# Patient Record
Sex: Male | Born: 1975 | Race: Black or African American | Hispanic: No | Marital: Married | Smoking: Never smoker
Health system: Southern US, Community
[De-identification: ages and names within clinical notes are randomized; demographics above are authoritative.]

## PROBLEM LIST (undated history)

## (undated) DIAGNOSIS — M3214 Glomerular disease in systemic lupus erythematosus: Secondary | ICD-10-CM

## (undated) DIAGNOSIS — M1990 Unspecified osteoarthritis, unspecified site: Secondary | ICD-10-CM

## (undated) DIAGNOSIS — N059 Unspecified nephritic syndrome with unspecified morphologic changes: Secondary | ICD-10-CM

## (undated) DIAGNOSIS — D61818 Other pancytopenia: Secondary | ICD-10-CM

## (undated) DIAGNOSIS — E785 Hyperlipidemia, unspecified: Secondary | ICD-10-CM

## (undated) DIAGNOSIS — I639 Cerebral infarction, unspecified: Secondary | ICD-10-CM

## (undated) DIAGNOSIS — I10 Essential (primary) hypertension: Secondary | ICD-10-CM

## (undated) DIAGNOSIS — D649 Anemia, unspecified: Secondary | ICD-10-CM

## (undated) HISTORY — PX: WISDOM TOOTH EXTRACTION: SHX21

## (undated) HISTORY — PX: COLONOSCOPY: SHX174

## (undated) HISTORY — DX: Essential (primary) hypertension: I10

## (undated) HISTORY — DX: Unspecified osteoarthritis, unspecified site: M19.90

## (undated) HISTORY — DX: Cerebral infarction, unspecified: I63.9

## (undated) HISTORY — DX: Anemia, unspecified: D64.9

## (undated) HISTORY — PX: TONSILLECTOMY: SUR1361

## (undated) HISTORY — DX: Hyperlipidemia, unspecified: E78.5

## (undated) HISTORY — DX: Other pancytopenia: D61.818

## (undated) HISTORY — DX: Glomerular disease in systemic lupus erythematosus: M32.14

---

## 2001-12-06 HISTORY — PX: RENAL BIOPSY: SHX156

## 2002-05-17 ENCOUNTER — Encounter: Payer: Self-pay | Admitting: *Deleted

## 2002-05-17 ENCOUNTER — Observation Stay (HOSPITAL_COMMUNITY): Admission: EM | Admit: 2002-05-17 | Discharge: 2002-05-18 | Payer: Self-pay | Admitting: *Deleted

## 2002-05-18 ENCOUNTER — Encounter: Payer: Self-pay | Admitting: Family Medicine

## 2002-05-20 ENCOUNTER — Emergency Department (HOSPITAL_COMMUNITY): Admission: EM | Admit: 2002-05-20 | Discharge: 2002-05-20 | Payer: Self-pay | Admitting: Emergency Medicine

## 2003-03-04 ENCOUNTER — Emergency Department (HOSPITAL_COMMUNITY): Admission: AD | Admit: 2003-03-04 | Discharge: 2003-03-04 | Payer: Self-pay | Admitting: Emergency Medicine

## 2003-10-28 ENCOUNTER — Emergency Department (HOSPITAL_COMMUNITY): Admission: EM | Admit: 2003-10-28 | Discharge: 2003-10-28 | Payer: Self-pay | Admitting: Emergency Medicine

## 2004-01-12 ENCOUNTER — Emergency Department (HOSPITAL_COMMUNITY): Admission: EM | Admit: 2004-01-12 | Discharge: 2004-01-12 | Payer: Self-pay | Admitting: Emergency Medicine

## 2009-02-18 ENCOUNTER — Ambulatory Visit: Payer: Self-pay | Admitting: Family Medicine

## 2009-02-18 ENCOUNTER — Encounter: Payer: Self-pay | Admitting: Family Medicine

## 2009-02-18 DIAGNOSIS — I10 Essential (primary) hypertension: Secondary | ICD-10-CM

## 2009-02-18 DIAGNOSIS — M25569 Pain in unspecified knee: Secondary | ICD-10-CM | POA: Insufficient documentation

## 2009-02-18 DIAGNOSIS — M25559 Pain in unspecified hip: Secondary | ICD-10-CM | POA: Insufficient documentation

## 2009-02-18 DIAGNOSIS — E785 Hyperlipidemia, unspecified: Secondary | ICD-10-CM | POA: Insufficient documentation

## 2009-02-18 HISTORY — DX: Hyperlipidemia, unspecified: E78.5

## 2009-02-18 HISTORY — DX: Pain in unspecified hip: M25.559

## 2009-02-18 HISTORY — DX: Essential (primary) hypertension: I10

## 2009-03-08 ENCOUNTER — Emergency Department (HOSPITAL_COMMUNITY): Admission: EM | Admit: 2009-03-08 | Discharge: 2009-03-09 | Payer: Self-pay | Admitting: Emergency Medicine

## 2009-07-08 ENCOUNTER — Ambulatory Visit: Payer: Self-pay | Admitting: Family Medicine

## 2009-07-08 DIAGNOSIS — M545 Low back pain, unspecified: Secondary | ICD-10-CM | POA: Insufficient documentation

## 2009-10-23 ENCOUNTER — Encounter (INDEPENDENT_AMBULATORY_CARE_PROVIDER_SITE_OTHER): Payer: Self-pay | Admitting: *Deleted

## 2011-02-15 ENCOUNTER — Ambulatory Visit (INDEPENDENT_AMBULATORY_CARE_PROVIDER_SITE_OTHER): Payer: Federal, State, Local not specified - PPO | Admitting: Family Medicine

## 2011-02-15 ENCOUNTER — Encounter: Payer: Self-pay | Admitting: Family Medicine

## 2011-02-15 VITALS — BP 120/90 | Temp 98.5°F | Ht 77.0 in | Wt 253.0 lb

## 2011-02-15 DIAGNOSIS — M549 Dorsalgia, unspecified: Secondary | ICD-10-CM

## 2011-02-15 DIAGNOSIS — M546 Pain in thoracic spine: Secondary | ICD-10-CM

## 2011-02-15 MED ORDER — DICLOFENAC SODIUM 75 MG PO TBEC: 75.0000 mg | DELAYED_RELEASE_TABLET | Freq: Two times a day (BID) | ORAL | Status: AC

## 2011-02-15 MED ORDER — METHOCARBAMOL 750 MG PO TABS: 750.0000 mg | ORAL_TABLET | Freq: Four times a day (QID) | ORAL | Status: AC

## 2011-02-15 NOTE — Patient Instructions (Signed)
Use heat or ice for symptomatic relief.   Be in touch if pain no better in one week.

## 2011-02-15 NOTE — Progress Notes (Signed)
  Subjective:    Patient ID: Austin [name], male    DOB: 1976/09/18, 35 y.o.   MRN: [id number]  HPI  patient seen with pain cervical neck upper back and radiating to both shoulders. Onset last Thursday. 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 He was in a different car which he thinks might have been a factor. He describes throbbing and sharp pain which is not well localized. Question of some left hand weakness. No known injury. Does lifting up to 70-80 pounds with job. Tried left over muscle relaxer without much improvement. Also ibuprofen without relief. Muscle massage without relief. No history of disc herniation.   Review of Systems  Constitutional: Negative for fever, chills, activity change, appetite change and fatigue.  HENT: Positive for neck pain and neck stiffness.   Respiratory: Negative for cough and shortness of breath.   Cardiovascular: Negative for chest pain.  Musculoskeletal: Positive for back pain. Negative for joint swelling and arthralgias.  Neurological: Negative for headaches.       Objective:   Physical Exam    patient is alert and in no distress Neck exam reveals no spinal tenderness.  Cervical muscle tenderness. Pain with flexion, extension, and lateral bending to the right and left side. No adenopathy noted Chest clear to auscultation Heart regular rhythm and rate   upper extremities reveal no muscle atrophy.  Neuro exam strength is full throughout. Deep tender reflexes 2+ throughout upper extremity. Normal symmetric function to touch    Assessment & Plan:   upper back and neck pain. Suspect musculoskeletal. Robaxin 750 mg every 6 hours as needed and diclofenac 75 mg twice a day. Work note written through the 15th of this month. Consider physical therapy if not improving by end of week.

## 2011-04-23 NOTE — Discharge Summary (Signed)
[facility]  Patient:    Austin [name], Austin [name] Visit Number: [id number] MRN: [id number]          Service Type: EMS Location: ED Attending Physician:  Donnetta Hutching Dictated by:   Donna Bernard, M.D. Admit Date:  05/20/2002 Discharge Date: 05/20/2002                             Discharge Summary  DISCHARGE DIAGNOSIS:  Nephrotic syndrome.  DISPOSITION:  Discharged to home.  DISCHARGE MEDICATION:  Altace 5 mg daily.  SPECIAL INSTRUCTIONS:  Avoid antiinflammatory medicine.  FOLLOWUP:  Follow up in the office in one to two weeks with Dr. Kristian Covey.  HISTORY OF PRESENT ILLNESS:  Please see H&P as dictated.  HOSPITAL COURSE:  This patient is a 35 year old, African-American male with benign prior medical history presenting to the hospital on the day of admission with achiness in arms and legs.  He had significant protein in his urine.  He had significant puffiness in his eyes and face.  His blood pressure was elevated.  Lab work revealed creatinine at 1.3 and sedimentation rate up at 42.  Albumin was less than 2.  The patient had a significant amount of protein in the urine.  Total protein in the blood was less than five.  This was felt to be a rather acute onset of nephrotic syndrome.  The patient was admitted to the hospital and further blood work was obtained.  The patient was started on Altace 5 mg daily to control his blood pressure.  Dr. Kristian Covey was consulted. He recommended further blood tests which were ordered.  A 24-hour creatinine clearance was performed.  The patient had a lipid profile which showed borderline high LDL at 140.  Hepatitis studies came back negative.  ANA was pending at the time of discharge.  After the 24-hour studies were done, the ANA returned significantly positive at 1280 and RPR was negative.  The patient continued to have significant proteinuria.  The day following admission, the patient was noted to be fairly  stable. Dr. Kristian Covey felt that we could continue workup on an outpatient basis.  He also had a chest x-ray which shows slight bronchitic changes.  He also had an upper abdominal ultrasound which showed pleural effusions, but no other difficulties.  The patient was discharged home on the day of discharge with diagnosis and disposition as noted above. Dictated by:   Donna Bernard, M.D. Attending Physician:  Donnetta Hutching DD:  06/05/02 TD:  06/07/02 Job: [id number]

## 2011-04-23 NOTE — Consult Note (Signed)
[facility]  Patient:    Austin [name], Austin [name] Visit Number: [id number] MRN: [id number]          Service Type: OBV Location: 3A A311 01 Attending Physician:  Harlow Asa Dictated by:   Maudry Diego, M.D. Proc. Date: 05/17/02 Admit Date:  05/17/2002 Discharge Date: 05/18/2002                            Consultation Report  REASON FOR CONSULTATION:  Proteinuria and leg edema.  HISTORY OF PRESENT ILLNESS:  The patient is a 35 year old African-American male with a significant past medical history.  Presently, came with complaints of swelling of his eyelids, his feet.  During his workup, he was found to have a 300 mg/dL of proteinuria; hence, consult was called.  According to the patient initially his problem started with joint pain in his shoulders and back.  This was followed by some sort of mild sore throat.  In the last three to four days, the patient started to have swelling of his eyelids and also his legs.  The patient denies any fevers, chills, or sweating.  He does not have any cough nor sputum production.  He denies any urgency, frequency.  PAST MEDICAL HISTORY:  As stated above.  The patient does not have any significant past medical history.  MEDICATIONS:  He has never been treated with any medication.  ALLERGIES:  None.  SOCIAL HISTORY:  No history of smoking or alcohol abuse.  The patient married with two kids.  REVIEW OF SYSTEMS:  No fevers, chills, or sweating.  No nausea or vomiting but he has some bloating of his abdomen.  He has swelling of his eyelids the last two to three weeks.  He has also some occasional shortness of breath.  No diarrhea.  No urgency or frequency.  No change in the amount of his urine. The patient states that his urine is somewhat darker.  PHYSICAL EXAMINATION:  GENERAL:  The patient is alert and in no apparent distress.  VITAL SIGNS:  He is afebrile.  Blood pressure is 147/71.  Heart rate is  81.  HEENT:  He has a little bit of puffiness of his eyelids.  There was no conjunctivae or pallor.  Nonicteric.  Oral mucosa seems to be moist and pink.  NECK:  Supple.  No JVD.  CHEST:  Decreased breath sounds.  Otherwise seems to be clear.  HEART:  Regular rate and rhythm.  No murmur.  ABDOMEN:  Soft.  Positive bowel sounds.  EXTREMITIES:  He has trace to 1+ edema.  LABORATORY DATA:  Chest x-ray has ______ changes ______, minimal interstitial prominence.  His blood work shows his white blood cell count is 3.1, hemoglobin 11.4, hematocrit 33.7, platelet of 203,000.  Potassium is 4, sodium is 139, BUN 18, creatinine 1.7, calcium 7.3.  Protein is 5, albumin is 2.  ACT is 196.  AST, ALT, and alkaline phosphatase is normal.  His urine is clear, specific gravity is 1.02, pH of 5, glucose negative.  Hemoglobin moderate, protein more than 300.  He has white blood cells 11 with 2 bacteria.  No red blood cells.  ASSESSMENT:  Proteinuria.  DISPOSITION: 1. The etiology at this moment is not clear, but in a patient who has a    history of sore throat, joint pain with some swelling and hypertension,    glomerulonephritis seems to be high on the list.  In this type of  presentation, usually the patient will have red blood cells in the urine    which the patient has and protein in the urine which the patient has.  The    etiology for this could be anywhere from a disease such as streptococcal    oral infection, acute hepatitis, infection, and lupus.  Other etiologies    which can cause proteinuria with edema should also be entertained with this    patient.  The differential diagnosis includes in a young adult possibly    with hypertension, hypercalcemia, nephrosclerosis seems to be high in the    list, but membranous can not be ruled out.  However, secondary causes such    as lupus nephritis and diseases membranoproliferative disease also should    be put in the differential  diagnosis. 2. Hypertension:  Possibly related to his renal disease secondary to salt and    fluid retention.  RECOMMENDATIONS:  Will check a ASO titer.  We will do a hepatitis B surface-antigen, hepatitis antibody, and hepatitis C.  We will do a 24-hour urine for creatinine clearance and we will check her compliments.  We will do an ultrasound of the kidney to evaluate kidney size.  Possibly, the patient may benefit from a kidney biopsy, especially if the ASO titer is negative. Dictated by:   Maudry Diego, M.D. Attending Physician:  Harlow Asa DD:  05/17/02 TD:  05/19/02 Job: [id number]

## 2011-04-23 NOTE — H&P (Signed)
[facility]  Patient:    Austin, [name] Visit Number: [id number] MRN: [id number]          Service Type: OBV Location: 3A A311 01 Attending Physician:  Harlow Asa Dictated by:   Donna Bernard, M.D. Admit Date:  05/17/2002 Discharge Date: 05/18/2002                           History and Physical  CHIEF COMPLAINT:  Swelling in legs, feeling poorly.  SUBJECTIVE:  This patient is a 35 year old black male with a benign prior medical history.  He presented to the emergency room the day of admission feeling "terrible."  He noted diffuse achiness in his arms and legs and significant amount of weakness.  He also notes significant swelling in both legs and feet.  He notes over the past several weeks he first started noticing some swelling developing in his ankles and feet accompanied by a sense of fullness in his abdomen.  He also noted puffiness around his eyes.  Patient also had significant pain in his left shoulder and left knee off and on.  He recalls no swelling.  He has had no warmth, no fever, no cough, no chest pain per se, no change in bowel habits.  As far as his urinating, he has noted that his urine has been dark and perhaps slight dysuria at times.  He has also noted a bit of polyuria.  He has had slight nausea and noted intermittent mild epigastric discomfort at times.  PAST SURGICAL HISTORY:  Remote tonsillectomy.  FAMILY HISTORY:  Essentially negative.  ALLERGIES:  None known.  CURRENT MEDICATIONS:  None.  SOCIAL HISTORY:  Patient is married.  No tobacco or alcohol use.  No drug use.  REVIEW OF SYSTEMS:  Otherwise negative.  PHYSICAL EXAMINATION  VITAL SIGNS:  Blood pressure 152/92, afebrile.  GENERAL:  Alert.  In no apparent distress.  HEENT:  Tympanic membranes normal.  Pharynx normal.  Fundi:  Disks sharp.  NECK:  Supple.  No lymphadenopathy.  LUNGS:  Clear.  No rubs.  HEART:  Regular rate and rhythm.  No significant  murmurs.  ABDOMEN:  Mild epigastric tenderness.  No rebound, no guarding, no masses, no CVA tenderness.  EXTREMITIES:  Edema 1-2+ bilaterally.  Pulses intact.  SKIN:  No significant rash.  NEUROLOGIC:  Normal.  LABORATORIES:  CBC:  White blood count 3.1, hemoglobin 11.4, normal differential.  MET-7:  Potassium 4.0, BUN 27, creatinine 1.3.  Liver enzymes good.  Sedimentation rate up at 42.  CPK negative.  Urinalysis:  1.025 specific gravity, 7-10 red blood cells, 11-20 white blood cells, protein greater than 300, albumin level serum less than 2, total protein less than 5.  IMPRESSION:  Nephrotic syndrome, etiology unclear.  To expedite work-up, I have brought the patient in under observational status to get started with a stat work-up and evaluation.  PLAN:  Consultation with Dr. Kristian Covey.  He will see the patient tonight.  I have added ultrasound of the kidneys and upper abdomen and additional blood work.  Please see chart for details. Dictated by:   Donna Bernard, M.D. Attending Physician:  Harlow Asa DD:  05/17/02 TD:  05/20/02 Job: [id number]

## 2012-03-11 ENCOUNTER — Emergency Department (HOSPITAL_COMMUNITY)
Admission: EM | Admit: 2012-03-11 | Discharge: 2012-03-11 | Disposition: A | Discharge status: Home / Self Care | Payer: Federal, State, Local not specified - PPO | Source: Home / Self Care | Attending: Emergency Medicine | Admitting: Emergency Medicine

## 2012-03-11 ENCOUNTER — Encounter (HOSPITAL_COMMUNITY): Payer: Self-pay | Admitting: Emergency Medicine

## 2012-03-11 DIAGNOSIS — S0501XA Injury of conjunctiva and corneal abrasion without foreign body, right eye, initial encounter: Secondary | ICD-10-CM

## 2012-03-11 DIAGNOSIS — S058X9A Other injuries of unspecified eye and orbit, initial encounter: Secondary | ICD-10-CM

## 2012-03-11 DIAGNOSIS — H00019 Hordeolum externum unspecified eye, unspecified eyelid: Secondary | ICD-10-CM

## 2012-03-11 MED ORDER — POLYETHYL GLYCOL-PROPYL GLYCOL 0.4-0.3 % OP SOLN: 1.0000 [drp] | Freq: Four times a day (QID) | OPHTHALMIC | Status: DC | PRN

## 2012-03-11 MED ORDER — TETRACAINE HCL 0.5 % OP SOLN
1.0000 [drp] | Freq: Once | OPHTHALMIC | Status: AC
  Administered 2012-03-11: 1 [drp] via OPHTHALMIC

## 2012-03-11 MED ORDER — CIPROFLOXACIN HCL 0.3 % OP SOLN: OPHTHALMIC | Status: AC

## 2012-03-11 MED ORDER — TETRACAINE HCL 0.5 % OP SOLN
OPHTHALMIC | Status: AC
  Filled 2012-03-11: qty 2

## 2012-03-11 NOTE — Discharge Instructions (Signed)
You have a scratch of the eye on the cornea (the clear part of the eye). This condition may be caused by trauma. It is a common problem for people who wear contact lenses. Proper treatment is important. No evidence of infection is noted today, but you could develop an infection called a corneal ulcer or have some retained foreign body that may or may not have been noted today in the ED. Ulcers are not only painful, but they may also scar the cornea and cause permanent damage to the eye.  Do not rub your eyes. You may use Systane or artifical tears as much as you want to for comfort. Wear sunglasses if lights are hurting your eyes.If you wear contact lenses, do not use them until your eye caregiver approves.  Follow-up care is necessary to be sure the corneal abrasion is healing if not completely resolved in 2-3 days. See your caregiver or eye specialist as suggested for followup  Go to www.goodrx.com to look up your medications. This will give you a list of where you can find your prescriptions at the most affordable prices.   

## 2012-03-11 NOTE — ED Notes (Signed)
Right eye pain onset Wednesday.  Initiated in corner of eye and then spreads.  Reports pain is significant.  No other illness and no fever.  Patient reports history of the same.  Patient has seen specialist.  Reports having eye lashes trimmed.  Has an empty bottle labeled azithromycin  ophth solution prescribed by dr Nelle Don.

## 2012-03-11 NOTE — ED Provider Notes (Signed)
History     CSN: [id number]  Arrival date & time 03/11/12  0909   First MD Initiated Contact with Patient 03/11/12 0913      Chief Complaint  Patient presents with  . Eye Pain    (Consider location/radiation/quality/duration/timing/severity/associated sxs/prior treatment) HPI Comments: Patient with a history of recurrent sties comes in with one week of right upper eyelid pain, swelling, foreign body sensation. States he started doing warm compresses 4 times a day yesterday, with some relief. States he has been rubbing his eye. No nausea, vomiting, photophobia, visual changes, discharge from eyes. No facial rash, conjunctival injection. Patient does not wear contacts. Has been treated in the past with azithromycin eyedrops with relief. States this feels identical to previous episodes of styes. Patient's ophthalmologist is Dr. Nelle Don.  ROS as noted in HPI. All other ROS negative.   Patient is a 36 y.o. male presenting with eye pain. The history is provided by the patient. No language interpreter was used.  Eye Pain This is a recurrent problem. The current episode started more than 2 days ago. The problem occurs constantly. The problem has been gradually worsening. The symptoms are aggravated by nothing. The symptoms are relieved by nothing. Treatments tried: warm compresses. The treatment provided mild relief.    Past Medical History  Diagnosis Date  . HYPERLIPIDEMIA 02/18/2009  . HYPERTENSION 02/18/2009    Past Surgical History  Procedure Date  . Renal biopsy 2003  . Tonsillectomy     Family History  Problem Relation Age of Onset  . Hypertension Maternal Grandmother   . Diabetes Maternal Grandmother   . Hypertension Maternal Grandfather   . Cancer Maternal Grandfather     prostate    History  Substance Use Topics  . Smoking status: Never Smoker   . Smokeless tobacco: Not on file  . Alcohol Use: Yes      Review of Systems  Eyes: Positive for pain.    Allergies   Review of patient's allergies indicates no known allergies.  Home Medications   Current Outpatient Rx  Name Route Sig Dispense Refill  . CIPROFLOXACIN HCL 0.3 % OP SOLN  1-2 drops in affected eye 4 times/day x 5 days 5 mL 0  . POLYETHYL GLYCOL-PROPYL GLYCOL 0.4-0.3 % OP SOLN Ophthalmic Apply 1 drop to eye 4 (four) times daily as needed. 5 mL 0  . TRAMADOL HCL 50 MG PO TABS Oral Take 50 mg by mouth every 6 (six) hours as needed.        BP 130/89  Pulse 74  Temp(Src) 98 F (36.7 C) (Oral)  Resp 17  SpO2 98%  Physical Exam  Nursing note and vitals reviewed. Constitutional: He is oriented to person, place, and time. He appears well-developed and well-nourished.  HENT:  Head: Normocephalic and atraumatic.  Eyes: Conjunctivae and EOM are normal. Pupils are equal, round, and reactive to light. No foreign bodies found. Right eye exhibits hordeolum. Right eye exhibits no chemosis, no discharge and no exudate. No scleral icterus.  Slit lamp exam:      The right eye shows corneal abrasion and fluorescein uptake. The right eye shows no corneal ulcer and no foreign body.         Visual acuity left eye 20/20, right eye 2025.  Neck: Normal range of motion.  Cardiovascular: Normal rate.   Pulmonary/Chest: Effort normal. No respiratory distress.  Abdominal: He exhibits no distension.  Musculoskeletal: Normal range of motion.  Neurological: He is alert and oriented to person,  place, and time.  Skin: Skin is warm and dry.  Psychiatric: He has a normal mood and affect. His behavior is normal.    ED Course  Procedures (including critical care time)  Labs Reviewed - No data to display No results found.   1. Corneal abrasion, right   2. Hordeolum       MDM    Luiz Blare, MD 03/11/12 [phone]

## 2013-09-28 ENCOUNTER — Encounter (HOSPITAL_COMMUNITY): Payer: Self-pay | Admitting: Emergency Medicine

## 2013-09-28 ENCOUNTER — Emergency Department (HOSPITAL_COMMUNITY): Payer: Federal, State, Local not specified - PPO

## 2013-09-28 ENCOUNTER — Inpatient Hospital Stay (HOSPITAL_COMMUNITY)
Admission: EM | Admit: 2013-09-28 | Discharge: 2013-10-02 | DRG: 546 | Disposition: A | Discharge status: Home / Self Care | Payer: Federal, State, Local not specified - PPO | Attending: Internal Medicine | Admitting: Internal Medicine

## 2013-09-28 DIAGNOSIS — D61818 Other pancytopenia: Secondary | ICD-10-CM | POA: Diagnosis present

## 2013-09-28 DIAGNOSIS — M2550 Pain in unspecified joint: Secondary | ICD-10-CM | POA: Diagnosis present

## 2013-09-28 DIAGNOSIS — N059 Unspecified nephritic syndrome with unspecified morphologic changes: Secondary | ICD-10-CM

## 2013-09-28 DIAGNOSIS — R319 Hematuria, unspecified: Secondary | ICD-10-CM | POA: Diagnosis present

## 2013-09-28 DIAGNOSIS — M329 Systemic lupus erythematosus, unspecified: Principal | ICD-10-CM | POA: Diagnosis present

## 2013-09-28 DIAGNOSIS — N041 Nephrotic syndrome with focal and segmental glomerular lesions: Secondary | ICD-10-CM | POA: Diagnosis present

## 2013-09-28 DIAGNOSIS — I10 Essential (primary) hypertension: Secondary | ICD-10-CM | POA: Diagnosis present

## 2013-09-28 DIAGNOSIS — E871 Hypo-osmolality and hyponatremia: Secondary | ICD-10-CM | POA: Diagnosis present

## 2013-09-28 DIAGNOSIS — N032 Chronic nephritic syndrome with diffuse membranous glomerulonephritis: Secondary | ICD-10-CM | POA: Diagnosis present

## 2013-09-28 DIAGNOSIS — R600 Localized edema: Secondary | ICD-10-CM

## 2013-09-28 DIAGNOSIS — D696 Thrombocytopenia, unspecified: Secondary | ICD-10-CM

## 2013-09-28 DIAGNOSIS — N058 Unspecified nephritic syndrome with other morphologic changes: Secondary | ICD-10-CM | POA: Diagnosis present

## 2013-09-28 DIAGNOSIS — D649 Anemia, unspecified: Secondary | ICD-10-CM

## 2013-09-28 DIAGNOSIS — E785 Hyperlipidemia, unspecified: Secondary | ICD-10-CM | POA: Diagnosis present

## 2013-09-28 DIAGNOSIS — N179 Acute kidney failure, unspecified: Secondary | ICD-10-CM | POA: Diagnosis present

## 2013-09-28 DIAGNOSIS — D638 Anemia in other chronic diseases classified elsewhere: Secondary | ICD-10-CM | POA: Diagnosis present

## 2013-09-28 HISTORY — DX: Unspecified nephritic syndrome with unspecified morphologic changes: N05.9

## 2013-09-28 HISTORY — DX: Pain in unspecified joint: M25.50

## 2013-09-28 LAB — CBC WITH DIFFERENTIAL/PLATELET
Basophils Absolute: 0 10*3/uL (ref 0.0–0.1)
Basophils Relative: 0 % (ref 0–1)
Eosinophils Absolute: 0.1 10*3/uL (ref 0.0–0.7)
Eosinophils Relative: 2 % (ref 0–5)
HCT: 28.7 % — ABNORMAL LOW (ref 39.0–52.0)
Hemoglobin: 9.5 g/dL — ABNORMAL LOW (ref 13.0–17.0)
Lymphocytes Relative: 42 % (ref 12–46)
Lymphs Abs: 1.4 10*3/uL (ref 0.7–4.0)
MCH: 27.7 pg (ref 26.0–34.0)
MCHC: 33.1 g/dL (ref 30.0–36.0)
MCV: 83.7 fL (ref 78.0–100.0)
Monocytes Absolute: 0.3 10*3/uL (ref 0.1–1.0)
Monocytes Relative: 9 % (ref 3–12)
Neutro Abs: 1.6 10*3/uL — ABNORMAL LOW (ref 1.7–7.7)
Neutrophils Relative %: 47 % (ref 43–77)
Platelets: 131 10*3/uL — ABNORMAL LOW (ref 150–400)
RBC: 3.43 MIL/uL — ABNORMAL LOW (ref 4.22–5.81)
RDW: 13 % (ref 11.5–15.5)
WBC: 3.4 10*3/uL — ABNORMAL LOW (ref 4.0–10.5)

## 2013-09-28 LAB — URINALYSIS, ROUTINE W REFLEX MICROSCOPIC
Bilirubin Urine: NEGATIVE
Glucose, UA: NEGATIVE mg/dL
Ketones, ur: 15 mg/dL — AB
Leukocytes, UA: NEGATIVE
Nitrite: NEGATIVE
Protein, ur: >300 mg/dL — AB
Specific Gravity, Urine: 1.029 (ref 1.005–1.030)
Urobilinogen, UA: 1 mg/dL (ref 0.0–1.0)
pH: 5.5 (ref 5.0–8.0)

## 2013-09-28 LAB — COMPREHENSIVE METABOLIC PANEL
ALT: 13 U/L (ref 0–53)
AST: 20 U/L (ref 0–37)
Albumin: 2.2 g/dL — ABNORMAL LOW (ref 3.5–5.2)
Alkaline Phosphatase: 56 U/L (ref 39–117)
BUN: 21 mg/dL (ref 6–23)
CO2: 25 mEq/L (ref 19–32)
Calcium: 7.2 mg/dL — ABNORMAL LOW (ref 8.4–10.5)
Chloride: 106 mEq/L (ref 96–112)
Creatinine, Ser: 1.22 mg/dL (ref 0.50–1.35)
GFR calc Af Amer: 85 mL/min — ABNORMAL LOW (ref >90)
GFR calc non Af Amer: 73 mL/min — ABNORMAL LOW (ref >90)
Glucose, Bld: 88 mg/dL (ref 70–99)
Potassium: 3.9 mEq/L (ref 3.5–5.1)
Sodium: 135 mEq/L (ref 135–145)
Total Bilirubin: 0.6 mg/dL (ref 0.3–1.2)
Total Protein: 6.5 g/dL (ref 6.0–8.3)

## 2013-09-28 LAB — URINE MICROSCOPIC-ADD ON

## 2013-09-28 LAB — PRO B NATRIURETIC PEPTIDE: Pro B Natriuretic peptide (BNP): 361.9 pg/mL — ABNORMAL HIGH (ref 0–125)

## 2013-09-28 MED ORDER — SODIUM CHLORIDE 0.9 % IJ SOLN
3.0000 mL | Freq: Two times a day (BID) | INTRAMUSCULAR | Status: DC
  Administered 2013-09-29 – 2013-10-02 (×6): 3 mL via INTRAVENOUS

## 2013-09-28 MED ORDER — ACETAMINOPHEN 325 MG PO TABS
650.0000 mg | ORAL_TABLET | Freq: Four times a day (QID) | ORAL | Status: DC | PRN
  Administered 2013-09-29: 650 mg via ORAL
  Filled 2013-09-28: qty 2

## 2013-09-28 MED ORDER — ONDANSETRON HCL 4 MG/2ML IJ SOLN
4.0000 mg | Freq: Four times a day (QID) | INTRAMUSCULAR | Status: DC | PRN
  Administered 2013-09-29 – 2013-09-30 (×2): 4 mg via INTRAVENOUS
  Filled 2013-09-28 (×2): qty 2

## 2013-09-28 MED ORDER — ACETAMINOPHEN 650 MG RE SUPP: 650.0000 mg | Freq: Four times a day (QID) | RECTAL | Status: DC | PRN

## 2013-09-28 MED ORDER — SODIUM CHLORIDE 0.9 % IV SOLN: 250.0000 mL | INTRAVENOUS | Status: DC | PRN

## 2013-09-28 MED ORDER — ONDANSETRON HCL 4 MG PO TABS: 4.0000 mg | ORAL_TABLET | Freq: Four times a day (QID) | ORAL | Status: DC | PRN

## 2013-09-28 MED ORDER — SODIUM CHLORIDE 0.9 % IJ SOLN: 3.0000 mL | INTRAMUSCULAR | Status: DC | PRN

## 2013-09-28 NOTE — Progress Notes (Signed)
  Pt orientation to unit, room and routine. Information packet given to patient/family.  Admission INP armband ID verified with patient/family, and in place. SR up x 2, fall risk assessment complete with Patient and family verbalizing understanding of risks associated with falls. Pt verbalizes an understanding of how to use the call bell and to call for help before getting out of bed.    Will cont to monitor and assist as needed.  Gilman Schmidt, RN 09/28/2013 11:56 PM

## 2013-09-28 NOTE — H&P (Signed)
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  Has not had any problems since then.  Has had scattered follow up.  Over the last 2-3 weeks he has noted his urine is darker in color and has gained about 30 lbs , he is having joint pain and swelling in both hands, and feet.  No fevers.  No n/v/d.  No cough.  No sob.  Vss.  Not established with a nephrologist locally, and cannot get appt for 3 weeks.  Asked to admit for nephrology consultation in am.  Review of Systems:  Positive and negative as per HPI otherwise all other systems are negative  Past Medical History: Past Medical History  Diagnosis Date  . HYPERLIPIDEMIA 02/18/2009  . HYPERTENSION 02/18/2009  . Nephritis    Past Surgical History  Procedure Laterality Date  . Renal biopsy  2003  . Tonsillectomy      Medications: Prior to Admission medications   Not on File    Allergies:  No Known Allergies  Social History:  reports that he has never smoked. He does not have any smokeless tobacco history on file. He reports that he drinks alcohol. He reports that he does not use illicit drugs.  Family History: Family History  Problem Relation Age of Onset  . Hypertension Maternal Grandmother   . Diabetes Maternal Grandmother   . Hypertension Maternal Grandfather   . Cancer Maternal Grandfather     prostate    Physical Exam: Filed Vitals:   09/28/13 1945 09/28/13 2000 09/28/13 2015 09/28/13 2115  BP: 139/76 131/85 135/77 135/78  Pulse:   88   Temp:   98.9 F (37.2 C)   TempSrc:   Oral   Resp:      Height:      Weight:      SpO2:   99%    General appearance: alert, cooperative and no distress Head: Normocephalic, without obvious abnormality, atraumatic Eyes: negative Nose: Nares normal. Septum midline. Mucosa normal. No  drainage or sinus tenderness. Neck: no JVD and supple, symmetrical, trachea midline Lungs: clear to auscultation bilaterally Heart: regular rate and rhythm, S1, S2 normal, no murmur, click, rub or gallop Abdomen: soft, non-tender; bowel sounds normal; no masses,  no organomegaly Extremities: edema trace Pulses: 2+ and symmetric Skin: Skin color, texture, turgor normal. No rashes or lesions Neurologic: Grossly normal    Labs on Admission:   Recent Labs  09/28/13 1910  NA 135  K 3.9  CL 106  CO2 25  GLUCOSE 88  BUN 21  CREATININE 1.22  CALCIUM 7.2*    Recent Labs  09/28/13 1910  AST 20  ALT 13  ALKPHOS 56  BILITOT 0.6  PROT 6.5  ALBUMIN 2.2*    Recent Labs  09/28/13 1910  WBC 3.4*  NEUTROABS 1.6*  HGB 9.5*  HCT 28.7*  MCV 83.7  PLT 131*    Radiological Exams on Admission: Dg Chest 2 View  09/28/2013   CLINICAL DATA:  Lower extremity swelling.  EXAM: CHEST  2 VIEW  COMPARISON:  None.  FINDINGS: The heart size and mediastinal contours are within normal limits. Both lungs are clear. The visualized skeletal structures are unremarkable.  IMPRESSION: No active cardiopulmonary disease.   Electronically Signed   By: Marry Guan.D.  On: 09/28/2013 21:59    Assessment/Plan 37 yo male with nephrotic syndrome appears recurrent  Principal Problem:   Nephrotic syndrome, focal and segmental glomerular lesions- hold off on any steroids at this time.  neprology has been called and to see pt in am for further recommendations.  obs on med surg.  Active Problems:   HYPERTENSION   Joint pain   Focal segmental glomerulosclerosis, tip variant with nephrosis    Austin [name],Austin [name] A 09/28/2013, 10:50 PM

## 2013-09-28 NOTE — ED Provider Notes (Signed)
CSN: [id number]     Arrival date & time 09/28/13  1823 History   First MD Initiated Contact with Patient 09/28/13 1920     Chief Complaint  Patient presents with  . 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  Nothing improves.  No meds except was taking nsaids prn for joint pains as his finger joints have been swelling.  No new rashes.  Pt still urinating okay. No chf hx.  Mild sob.  No liver issues. No smoking, etoh or drugs. Possible bite to medial left ankle, unsure from what.    The history is provided by the patient and a relative.    Past Medical History  Diagnosis Date  . HYPERLIPIDEMIA 02/18/2009  . HYPERTENSION 02/18/2009  . Nephritis    Past Surgical History  Procedure Laterality Date  . Renal biopsy  2003  . Tonsillectomy     Family History  Problem Relation Age of Onset  . Hypertension Maternal Grandmother   . Diabetes Maternal Grandmother   . Hypertension Maternal Grandfather   . Cancer Maternal Grandfather     prostate   History  Substance Use Topics  . Smoking status: Never Smoker   . Smokeless tobacco: Not on file  . Alcohol Use: Yes     Comment: rare    Review of Systems  Constitutional: Positive for appetite change, fatigue and unexpected weight change. Negative for fever and chills.  HENT: Negative for congestion.   Eyes: Negative for visual disturbance.  Respiratory: Positive for shortness of breath. Negative for cough.   Cardiovascular: Positive for leg swelling. Negative for chest pain.  Gastrointestinal: Negative for vomiting and abdominal pain.  Genitourinary: Positive for difficulty urinating. Negative for dysuria and flank pain.  Musculoskeletal: Negative for back pain,  neck pain and neck stiffness.  Skin: Positive for wound. Negative for rash.  Neurological: Negative for light-headedness and headaches.    Allergies  Review of patient's allergies indicates no known allergies.  Home Medications   Current Outpatient Rx  Name  Route  Sig  Dispense  Refill  . Polyethyl Glycol-Propyl Glycol (SYSTANE) 0.4-0.3 % SOLN   Ophthalmic   Apply 1 drop to eye 4 (four) times daily as needed.   5 mL   0   . traMADol (ULTRAM) 50 MG tablet   Oral   Take 50 mg by mouth every 6 (six) hours as needed.            BP 102/76  Pulse 77  Temp(Src) 98.4 F (36.9 C) (Oral)  Resp 18  Ht 6\' 6"  (1.981 m)  Wt 246 lb 8 oz (111.812 kg)  BMI 28.49 kg/m2  SpO2 100% Physical Exam  Nursing note and vitals reviewed. Constitutional: He is oriented to person, place, and time. He appears well-developed and well-nourished. No distress.  HENT:  Head: Normocephalic and atraumatic.  Eyes: Conjunctivae are normal. Right eye exhibits no discharge. Left eye exhibits no discharge.  Neck: Normal range of motion. Neck supple. No tracheal deviation present.  Cardiovascular: Normal rate and regular rhythm.   Pulmonary/Chest: Effort normal and breath sounds normal.  Abdominal: Soft. He exhibits distension (mild). There is no tenderness. There is no guarding.  Musculoskeletal: He exhibits edema (mild bilateral LE).  Neurological: He is alert and oriented to person,  place, and time.  Skin: Skin is warm. No rash noted.  Psychiatric: He has a normal mood and affect.    ED Course  Procedures (including critical care time) Labs Review Labs Reviewed  CBC WITH DIFFERENTIAL - Abnormal; Notable for the following:    WBC 3.4 (*)    RBC 3.43 (*)    Hemoglobin 9.5 (*)    HCT 28.7 (*)    Platelets 131 (*)    Neutro Abs 1.6 (*)    All other components within normal limits  COMPREHENSIVE METABOLIC PANEL  URINALYSIS, ROUTINE W REFLEX MICROSCOPIC   Imaging Review Dg Chest 2  View  09/28/2013   CLINICAL DATA:  Lower extremity swelling.  EXAM: CHEST  2 VIEW  COMPARISON:  None.  FINDINGS: The heart size and mediastinal contours are within normal limits. Both lungs are clear. The visualized skeletal structures are unremarkable.  IMPRESSION: No active cardiopulmonary disease.   Electronically Signed   By: Roque Lias M.D.   On: 09/28/2013 21:59    EKG Interpretation   None       MDM   1. Glomerulonephritis   2. Anemia   3. Thrombocytopenia   4. Hematuria   5. Bilateral leg edema    Concern for kidney pathology along with possible systemic issue with HPI/ exam and hx.  Labs ordered. Well appearing in ED.   Clinically and labs confirm glomerulonephritis/ ?nephrotic syndrome. Spoke with Dr Arrie Aran, pt does not have close fup outpt so will need admission for diuresis, ? Biopsy and likely steroids. Spoke with Dr Onalee Hua hospitalist, agreed.   The patients results and plan were reviewed and discussed.   Any x-rays performed were personally reviewed by myself.   Differential diagnosis were considered with the presenting HPI.  Diagnosis: Glomerulonephritis. Leg edema bilateral, anemia, hematuria   Admission/ observation were discussed with the admitting physician, patient and/or family and they are comfortable with the plan.       Enid Skeens, MD 09/28/13 2219
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
## 2013-09-29 DIAGNOSIS — M2550 Pain in unspecified joint: Secondary | ICD-10-CM

## 2013-09-29 DIAGNOSIS — N032 Chronic nephritic syndrome with diffuse membranous glomerulonephritis: Secondary | ICD-10-CM

## 2013-09-29 DIAGNOSIS — D61818 Other pancytopenia: Secondary | ICD-10-CM

## 2013-09-29 DIAGNOSIS — D638 Anemia in other chronic diseases classified elsewhere: Secondary | ICD-10-CM | POA: Diagnosis present

## 2013-09-29 DIAGNOSIS — R609 Edema, unspecified: Secondary | ICD-10-CM

## 2013-09-29 HISTORY — DX: Other pancytopenia: D61.818

## 2013-09-29 LAB — CBC
MCHC: 33.7 g/dL (ref 30.0–36.0)
MCV: 83.1 fL (ref 78.0–100.0)
Platelets: 135 10*3/uL — ABNORMAL LOW (ref 150–400)
RDW: 12.9 % (ref 11.5–15.5)

## 2013-09-29 LAB — CREATININE, URINE, RANDOM: Creatinine, Urine: 207.98 mg/dL

## 2013-09-29 LAB — BASIC METABOLIC PANEL
BUN: 20 mg/dL (ref 6–23)
Calcium: 7.2 mg/dL — ABNORMAL LOW (ref 8.4–10.5)
Creatinine, Ser: 1.2 mg/dL (ref 0.50–1.35)
GFR calc Af Amer: 87 mL/min — ABNORMAL LOW (ref >90)
GFR calc non Af Amer: 75 mL/min — ABNORMAL LOW (ref >90)
Potassium: 3.7 mEq/L (ref 3.5–5.1)

## 2013-09-29 LAB — IRON AND TIBC: UIBC: 88 ug/dL — ABNORMAL LOW (ref 125–400)

## 2013-09-29 LAB — SODIUM, URINE, RANDOM: Sodium, Ur: 17 mEq/L

## 2013-09-29 LAB — PROTEIN / CREATININE RATIO, URINE: Creatinine, Urine: 207.73 mg/dL

## 2013-09-29 LAB — HAPTOGLOBIN: Haptoglobin: <25 mg/dL — ABNORMAL LOW (ref 45–215)

## 2013-09-29 LAB — VITAMIN B12: Vitamin B-12: 515 pg/mL (ref 211–911)

## 2013-09-29 MED ORDER — HYDROMORPHONE HCL PF 1 MG/ML IJ SOLN
1.0000 mg | INTRAMUSCULAR | Status: DC | PRN
  Administered 2013-09-29: 1 mg via INTRAVENOUS
  Filled 2013-09-29: qty 1

## 2013-09-29 MED ORDER — OXYCODONE-ACETAMINOPHEN 5-325 MG PO TABS
1.0000 | ORAL_TABLET | ORAL | Status: DC | PRN
  Administered 2013-09-29 – 2013-09-30 (×3): 2 via ORAL
  Filled 2013-09-29 (×3): qty 2

## 2013-09-29 NOTE — Consult Note (Signed)
Reason for Consult:recurrent nephrotic syndrome Referring Physician: Izola Price, MD  Austin [name] is an 37 y.o. male.  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  He was also on altace but this was stopped due to hypotension.  He has not followed up with any nephrologist or PCP for many years.  He noted "sudsy" urine about a month ago and lower ext edema about 3 weeks ago.  He does admit to taking Ibuprofen regularly for the last 4 months due to migratory arthralgias.  Denies any dysuria, pyuria, hematuria, retention, frequency, or nocturia.  After further questioning, he does report that he had several episodes of dark stools a couple of weeks ago and thought that it was related to something he was eating.  Trend in Creatinine: Creatinine, Ser  Date/Time Value Range Status  09/29/2013  6:10 AM 1.20  0.50 - 1.35 mg/dL Final  40/98/1191  4:78 PM 1.22  0.50 - 1.35 mg/dL Final    PMH:   Past Medical History  Diagnosis Date  . HYPERLIPIDEMIA 02/18/2009  . HYPERTENSION 02/18/2009  . Nephritis     PSH:   Past Surgical History  Procedure Laterality Date  . Renal biopsy  2003  . Tonsillectomy      Allergies: No Known Allergies  Medications:   Prior to Admission medications   Not on File    Inpatient medications: . sodium chloride  3 mL Intravenous Q12H    Discontinued Meds:   Medications Discontinued During This Encounter  Medication Reason  . Polyethyl Glycol-Propyl Glycol (SYSTANE) 0.4-0.3 % SOLN Patient has not taken in last 30 days  . traMADol (ULTRAM) 50 MG tablet Patient has not taken in last 30 days    Social History:  reports that he has never smoked. He has never used smokeless tobacco. He reports that he drinks alcohol. He reports that he does not use illicit drugs.  Family History:   Family History  Problem Relation Age of Onset   . Hypertension Maternal Grandmother   . Diabetes Maternal Grandmother   . Hypertension Maternal Grandfather   . Cancer Maternal Grandfather     prostate    A comprehensive review of systems was negative except for: Constitutional: positive for anorexia and malaise Ears, nose, mouth, throat, and face: positive for nasal congestion and HA intermittently Respiratory: positive for dyspnea on exertion and pleurisy/chest pain Gastrointestinal: positive for constipation and nausea Genitourinary: positive for foamy urine for 1 month Musculoskeletal: positive for arthralgias and have been migratory for 4 months Weight change:   Intake/Output Summary (Last 24 hours) at 09/29/13 1058 Last data filed at 09/29/13 0700  Gross per 24 hour  Intake      0 ml  Output      0 ml  Net      0 ml   BP 131/79  Pulse 85  Temp(Src) 98.8 F (37.1 C) (Oral)  Resp 18  Ht 6\' 5"  (1.956 m)  Wt 110.315 kg (243 lb 3.2 oz)  BMI 28.83 kg/m2  SpO2 96% Filed Vitals:   09/28/13 2015 09/28/13 2115 09/28/13 2306 09/29/13 0500  BP: 135/77 135/78 140/83 131/79  Pulse: 88  72 85  Temp: 98.9 F (37.2 C)  98.4 F (36.9 C) 98.8 F (37.1 C)  TempSrc: Oral  Oral Oral  Resp:   18 18  Height:  6\' 5"  (1.956 m)   Weight:   110.315 kg (243 lb 3.2 oz)   SpO2: 99%  97% 96%     General appearance: alert, cooperative and no distress Head: Normocephalic, without obvious abnormality, atraumatic Eyes: negative findings: lids and lashes normal, conjunctivae and sclerae normal and corneas clear Neck: no adenopathy, no carotid bruit, no JVD, supple, symmetrical, trachea midline and thyroid not enlarged, symmetric, no tenderness/mass/nodules Resp: clear to auscultation bilaterally Cardio: regular rate and rhythm, S1, S2 normal, no murmur, click, rub or gallop GI: soft, non-tender; bowel sounds normal; no masses,  no organomegaly Extremities: edema +pedal edema, L>R Neuro: grossly intact  Labs: Basic Metabolic  Panel:  Recent Labs Lab 09/28/13 1910 09/29/13 0610  NA 135 137  K 3.9 3.7  CL 106 108  CO2 25 23  GLUCOSE 88 86  BUN 21 20  CREATININE 1.22 1.20  ALBUMIN 2.2*  --   CALCIUM 7.2* 7.2*   Liver Function Tests:  Recent Labs Lab 09/28/13 1910  AST 20  ALT 13  ALKPHOS 56  BILITOT 0.6  PROT 6.5  ALBUMIN 2.2*   No results found for this basename: LIPASE, AMYLASE,  in the last 168 hours No results found for this basename: AMMONIA,  in the last 168 hours CBC:  Recent Labs Lab 09/28/13 1910 09/29/13 0610  WBC 3.4* 3.0*  NEUTROABS 1.6*  --   HGB 9.5* 9.3*  HCT 28.7* 27.6*  MCV 83.7 83.1  PLT 131* 135*   PT/INR: @LABRCNTIP (inr:5) Cardiac Enzymes: )No results found for this basename: CKTOTAL, CKMB, CKMBINDEX, TROPONINI,  in the last 168 hours CBG: No results found for this basename: GLUCAP,  in the last 168 hours  Iron Studies: No results found for this basename: IRON, TIBC, TRANSFERRIN, FERRITIN,  in the last 168 hours  Xrays/Other Studies: Dg Chest 2 View  09/28/2013   CLINICAL DATA:  Lower extremity swelling.  EXAM: CHEST  2 VIEW  COMPARISON:  None.  FINDINGS: The heart size and mediastinal contours are within normal limits. Both lungs are clear. The visualized skeletal structures are unremarkable.  IMPRESSION: No active cardiopulmonary disease.   Electronically Signed   By: Roque Lias M.D.   On: 09/28/2013 21:59     Assessment/Plan: 1. Recurrent nephrotic syndrome- pt with h/o tip lesion and treated with prolonged corticosteroids and now with a 1 month h/o foamy urine and worsening lower ext edema.  His presentation differs from his initial one in that he now has pancytopenia and migratory arthralgias.  He does have some family members with autoimmune diseases ie. Lupus and one with MS, as well as a cousin on HD.  He has not seen a physician for many years and no labs are available for review.  We discussed the risks and benefits of repeat renal biopsy and will  plan for on on Monday.  Will hold off on steroids until we r/o GIB given anemia and h/o dark stools.  Will add PPI if FOBT negative and start empirically on po prednisone.  Will also order serologies to investigate autoimmune process.   2. Anemia- normocytic. As above has h/o dark stools. Check stool cards, however in light of leukopenia and thrombocytopenia, will need further evaluation. 3. Leukopenia- as above 4. Thrombocytopenia- as above 5. Hyperlipidemia dt NS- will check lipid panel 6. HTN- borderline. Will follow for now 7. dispo- plan for renal biopsy on Monday and possible d/c the next day if his hematologic issues improve.   Danaly Bari A 09/29/2013,  10:58 AM

## 2013-09-29 NOTE — Progress Notes (Signed)
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 In ED, vital were stable with BP of 102/76, HR 72, T = 98.9 F and O2 saturation of 96 -100% on room air. CBC revealed pancytopenia with WBC count of 3.4, hemoglobin of 9.5 and platelet count og 131. BMP was unremarkable. Urinalysis showed more than 300 proteins in urine. CXR was WNL.  Assessment and Plan:  Principal Problem:   Nephrotic syndrome, focal and segmental glomerular lesions - in pt with history of FSGN tip lesion and treatment with prednisone in past - plan for renal biopsy as recommended by nephrology - appreciate IR consult  Active Problems:   HYPERTENSION - BP 131/79 - continue to monitor for now - not on any antihypertensives at this time   Migratory arthralgia - may use norco PO PRN for pain control - possible related to autoimmune disease - check autoimmune panel    Pancytopenia - unclear etiology - will see what the results of renal biopsy show  Code Status: Full Family Communication: Pt at bedside Disposition Plan: Home when medically stable  Consultants:  Nephrology  Interventional radiology - for renal biopsy  Procedures/Studies: Dg Chest 2 View  09/28/2013  IMPRESSION: No active cardiopulmonary disease.     Antibiotics:  None    HPI/Subjective: No events overnight.   Objective: Filed Vitals:   09/28/13 2015 09/28/13 2115 09/28/13 2306 09/29/13 0500  BP: 135/77 135/78 140/83 131/79  Pulse: 88  72 85  Temp: 98.9 F (37.2 C)  98.4 F (36.9 C) 98.8 F (37.1  C)  TempSrc: Oral  Oral Oral  Resp:   18 18  Height:   6\' 5"  (1.956 m)   Weight:   110.315 kg (243 lb 3.2 oz)   SpO2: 99%  97% 96%    Intake/Output Summary (Last 24 hours) at 09/29/13 1531 Last data filed at 09/29/13 0700  Gross per 24 hour  Intake      0 ml  Output      0 ml  Net      0 ml    Exam:   General:  Pt is alert, follows commands appropriately, not in acute distress  Cardiovascular: Regular rate and rhythm, S1/S2 appreciated  Respiratory: Clear to auscultation bilaterally, no wheezing, no crackles, no rhonchi  Abdomen: Soft, non tender, non distended, bowel sounds present, no guarding  Extremities: left more than right LE edema, pulses DP and PT palpable bilaterally  Neuro: Grossly nonfocal  Data Reviewed: Basic Metabolic Panel:  Recent Labs Lab 09/28/13 1910 09/29/13 0610  NA 135 137  K 3.9 3.7  CL 106 108  CO2 25 23  GLUCOSE 88 86  BUN 21 20  CREATININE 1.22 1.20  CALCIUM 7.2* 7.2*   Liver Function Tests:  Recent Labs Lab 09/28/13 1910  AST 20  ALT 13  ALKPHOS 56  BILITOT 0.6  PROT 6.5  ALBUMIN 2.2*   No results found for this basename: LIPASE, AMYLASE,  in the last 168 hours No results found for this basename: AMMONIA,  in the last 168 hours CBC:  Recent Labs Lab  09/28/13 1910 09/29/13 0610  WBC 3.4* 3.0*  NEUTROABS 1.6*  --   HGB 9.5* 9.3*  HCT 28.7* 27.6*  MCV 83.7 83.1  PLT 131* 135*   Cardiac Enzymes: No results found for this basename: CKTOTAL, CKMB, CKMBINDEX, TROPONINI,  in the last 168 hours BNP: No components found with this basename: POCBNP,  CBG: No results found for this basename: GLUCAP,  in the last 168 hours  No results found for this or any previous visit (from the past 240 hour(s)).   Scheduled Meds: . sodium chloride  3 mL Intravenous Q12H    Debbora Presto, MD  [facility] Pager [phone]  If 7PM-7AM, please contact night-coverage [email or web address] Password [email or web address] 09/29/2013, 3:31 PM   LOS: 1 day

## 2013-09-29 NOTE — Progress Notes (Signed)
Patient ID: DODGE ATOR, male   DOB: Feb 22, 1976, 37 y.o.   MRN: [id number] Request received for US guided random renal biopsy on pt with history of recurrent nephrotic syndrome. He is s/p left random renal bx 2003 at [facility] which revealed FSGS. He presents now with dark stools, pancytopenia, proteinuria, LE edema. Additional PMH as below. Exam: pt awake/alert; chest- CTA bilat; heart- RRR; abd- soft,+BS,NT; ext- trace - 1+  LE edema,> on left.    Filed Vitals:   09/28/13 2015 09/28/13 2115 09/28/13 2306 09/29/13 0500  BP: 135/77 135/78 140/83 131/79  Pulse: 88  72 85  Temp: 98.9 F (37.2 C)  98.4 F (36.9 C) 98.8 F (37.1 C)  TempSrc: Oral  Oral Oral  Resp:   18 18  Height:   6\' 5"  (1.956 m)   Weight:   243 lb 3.2 oz (110.315 kg)   SpO2: 99%  97% 96%   Past Medical History  Diagnosis Date  . HYPERLIPIDEMIA 02/18/2009  . HYPERTENSION 02/18/2009  . Nephritis    Past Surgical History  Procedure Laterality Date  . Renal biopsy  2003  . Tonsillectomy    Dg Chest 2 View  09/28/2013   CLINICAL DATA:  Lower extremity swelling.  EXAM: CHEST  2 VIEW  COMPARISON:  None.  FINDINGS: The heart size and mediastinal contours are within normal limits. Both lungs are clear. The visualized skeletal structures are unremarkable.  IMPRESSION: No active cardiopulmonary disease.   Electronically Signed   By: Roque Lias M.D.   On: 09/28/2013 21:59  Results for orders placed during the hospital encounter of 09/28/13  CBC WITH DIFFERENTIAL      Result Value Range   WBC 3.4 (*) 4.0 - 10.5 K/uL   RBC 3.43 (*) 4.22 - 5.81 MIL/uL   Hemoglobin 9.5 (*) 13.0 - 17.0 g/dL   HCT 78.2 (*) 95.6 - 21.3 %   MCV 83.7  78.0 - 100.0 fL   MCH 27.7  26.0 - 34.0 pg   MCHC 33.1  30.0 - 36.0 g/dL   RDW 08.6  57.8 - 46.9 %   Platelets 131 (*) 150 - 400 K/uL   Neutrophils Relative % 47  43 - 77 %   Neutro Abs 1.6 (*) 1.7 - 7.7 K/uL   Lymphocytes Relative 42  12 - 46 %   Lymphs Abs 1.4  0.7 - 4.0 K/uL   Monocytes  Relative 9  3 - 12 %   Monocytes Absolute 0.3  0.1 - 1.0 K/uL   Eosinophils Relative 2  0 - 5 %   Eosinophils Absolute 0.1  0.0 - 0.7 K/uL   Basophils Relative 0  0 - 1 %   Basophils Absolute 0.0  0.0 - 0.1 K/uL  COMPREHENSIVE METABOLIC PANEL      Result Value Range   Sodium 135  135 - 145 mEq/L   Potassium 3.9  3.5 - 5.1 mEq/L   Chloride 106  96 - 112 mEq/L   CO2 25  19 - 32 mEq/L   Glucose, Bld 88  70 - 99 mg/dL   BUN 21  6 - 23 mg/dL   Creatinine, Ser 6.29  0.50 - 1.35 mg/dL   Calcium 7.2 (*) 8.4 - 10.5 mg/dL   Total Protein 6.5  6.0 - 8.3 g/dL   Albumin 2.2 (*) 3.5 - 5.2 g/dL   AST 20  0 - 37 U/L   ALT 13  0 - 53 U/L   Alkaline  Phosphatase 56  39 - 117 U/L   Total Bilirubin 0.6  0.3 - 1.2 mg/dL   GFR calc non Af Amer 73 (*) >90 mL/min   GFR calc Af Amer 85 (*) >90 mL/min  URINALYSIS, ROUTINE W REFLEX MICROSCOPIC      Result Value Range   Color, Urine AMBER (*) YELLOW   APPearance CLOUDY (*) CLEAR   Specific Gravity, Urine 1.029  1.005 - 1.030   pH 5.5  5.0 - 8.0   Glucose, UA NEGATIVE  NEGATIVE mg/dL   Hgb urine dipstick LARGE (*) NEGATIVE   Bilirubin Urine NEGATIVE  NEGATIVE   Ketones, ur 15 (*) NEGATIVE mg/dL   Protein, ur >956 (*) NEGATIVE mg/dL   Urobilinogen, UA 1.0  0.0 - 1.0 mg/dL   Nitrite NEGATIVE  NEGATIVE   Leukocytes, UA NEGATIVE  NEGATIVE  URINE MICROSCOPIC-ADD ON      Result Value Range   Squamous Epithelial / LPF RARE  RARE   WBC, UA 0-2  <3 WBC/hpf   RBC / HPF 3-6  <3 RBC/hpf   Bacteria, UA MANY (*) RARE   Casts HYALINE CASTS (*) NEGATIVE   Urine-Other MUCOUS PRESENT    PRO B NATRIURETIC PEPTIDE      Result Value Range   Pro B Natriuretic peptide (BNP) 361.9 (*) 0 - 125 pg/mL  BASIC METABOLIC PANEL      Result Value Range   Sodium 137  135 - 145 mEq/L   Potassium 3.7  3.5 - 5.1 mEq/L   Chloride 108  96 - 112 mEq/L   CO2 23  19 - 32 mEq/L   Glucose, Bld 86  70 - 99 mg/dL   BUN 20  6 - 23 mg/dL   Creatinine, Ser 2.13  0.50 - 1.35 mg/dL    Calcium 7.2 (*) 8.4 - 10.5 mg/dL   GFR calc non Af Amer 75 (*) >90 mL/min   GFR calc Af Amer 87 (*) >90 mL/min  CBC      Result Value Range   WBC 3.0 (*) 4.0 - 10.5 K/uL   RBC 3.32 (*) 4.22 - 5.81 MIL/uL   Hemoglobin 9.3 (*) 13.0 - 17.0 g/dL   HCT 08.6 (*) 57.8 - 46.9 %   MCV 83.1  78.0 - 100.0 fL   MCH 28.0  26.0 - 34.0 pg   MCHC 33.7  30.0 - 36.0 g/dL   RDW 62.9  52.8 - 41.3 %   Platelets 135 (*) 150 - 400 K/uL   A/P: Pt with recurrent nephrotic syndrome in addition to pancytopenia. Tent plan is for US guided random renal biopsy on 10/27. Details/risks of procedure d/w pt/mother with their understanding and consent.

## 2013-09-30 DIAGNOSIS — N179 Acute kidney failure, unspecified: Secondary | ICD-10-CM | POA: Diagnosis present

## 2013-09-30 DIAGNOSIS — D638 Anemia in other chronic diseases classified elsewhere: Secondary | ICD-10-CM

## 2013-09-30 HISTORY — DX: Acute kidney failure, unspecified: N17.9

## 2013-09-30 LAB — CBC
HCT: 27.7 % — ABNORMAL LOW (ref 39.0–52.0)
Hemoglobin: 9.2 g/dL — ABNORMAL LOW (ref 13.0–17.0)
MCH: 27.5 pg (ref 26.0–34.0)
MCHC: 33.2 g/dL (ref 30.0–36.0)
MCV: 82.9 fL (ref 78.0–100.0)
Platelets: 131 10*3/uL — ABNORMAL LOW (ref 150–400)
RBC: 3.34 MIL/uL — ABNORMAL LOW (ref 4.22–5.81)

## 2013-09-30 LAB — COMPREHENSIVE METABOLIC PANEL
AST: 16 U/L (ref 0–37)
Albumin: 2 g/dL — ABNORMAL LOW (ref 3.5–5.2)
BUN: 21 mg/dL (ref 6–23)
Calcium: 6.9 mg/dL — ABNORMAL LOW (ref 8.4–10.5)
Chloride: 105 mEq/L (ref 96–112)
Creatinine, Ser: 1.41 mg/dL — ABNORMAL HIGH (ref 0.50–1.35)
GFR calc Af Amer: 71 mL/min — ABNORMAL LOW (ref >90)
Potassium: 4 mEq/L (ref 3.5–5.1)
Total Bilirubin: 0.4 mg/dL (ref 0.3–1.2)

## 2013-09-30 LAB — DIC (DISSEMINATED INTRAVASCULAR COAGULATION)PANEL
D-Dimer, Quant: 2.74 ug/mL-FEU — ABNORMAL HIGH (ref 0.00–0.48)
INR: 1.06 (ref 0.00–1.49)
Prothrombin Time: 13.6 seconds (ref 11.6–15.2)

## 2013-09-30 LAB — LIPID PANEL
LDL Cholesterol: 76 mg/dL (ref 0–99)
Total CHOL/HDL Ratio: 3.6 RATIO
VLDL: 18 mg/dL (ref 0–40)

## 2013-09-30 LAB — DIC (DISSEMINATED INTRAVASCULAR COAGULATION) PANEL: Platelets: 135 10*3/uL — ABNORMAL LOW (ref 150–400)

## 2013-09-30 MED ORDER — PANTOPRAZOLE SODIUM 40 MG PO TBEC
40.0000 mg | DELAYED_RELEASE_TABLET | Freq: Every day | ORAL | Status: DC
  Administered 2013-09-30 – 2013-10-02 (×3): 40 mg via ORAL
  Filled 2013-09-30 (×3): qty 1

## 2013-09-30 MED ORDER — SODIUM CHLORIDE 0.9 % IV SOLN
500.0000 mg | Freq: Every day | INTRAVENOUS | Status: AC
  Administered 2013-09-30 – 2013-10-02 (×3): 500 mg via INTRAVENOUS
  Filled 2013-09-30 (×3): qty 4

## 2013-09-30 NOTE — Progress Notes (Signed)
Pt no longer having abd pain.  Pt does c/o of an intermittent headache since he has been in the hospital.  Pt moved to a larger room in 6E18 to better accommodate himself and visitors.  Pt refused afternoon lab draw due to too frequent interruptions and being woken up often.  Lab rescheduled these for at 1930 tonight per pt request.  I informed pt and family we will try to minimize interruptions.

## 2013-09-30 NOTE — Progress Notes (Signed)
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 In ED, vital were stable with BP of 102/76, HR 72, T = 98.9 F and O2 saturation of 96 -100% on room air. CBC revealed pancytopenia with WBC count of 3.4, hemoglobin of 9.5 and platelet count og 131. BMP was unremarkable. Urinalysis showed more than 300 proteins in urine. CXR was WNL.   Assessment and Plan:   Principal Problem:  Nephrotic syndrome, focal and segmental glomerular lesions  - in pt with history of FSGN tip lesion in 2003 and treatment with prednisone in past  - plan for renal biopsy as recommended by nephrology  - appreciate IR consult for renal biopsy Active Problems:  HYPERTENSION  - BP 119/67 - not on any antihypertensives at this time  Migratory arthralgia  - follow up on autoimmune panel which is still pending - pain management with dilaudid 1 mg every 2 hours PRN severe pain and percocet PO PRN moderate pain Acute renal failure - likely related to GN - no changes in medications to reflect change in creatinine - plan for renal biopsy - appreciate nephrology following Pancytopenia  - unclear etiology  - WBC count ranges 3 - 3.4; platelet count mildly decreased at 135 and hemoglobin stable at 9.3 - iron level low at 27 and TIBC low at 115; ferritin is high at 753 and haptoglobin low - vitamin B12 is WNL and folic acid is pending - no indications for transfusion at this time   Code Status: Full  Family Communication: Pt at bedside  Disposition Plan: Home when medically stable     Consultants:  Nephrology  Interventional radiology - for renal biopsy Procedures/Studies:  Dg Chest 2 View 09/28/2013 IMPRESSION: No active cardiopulmonary disease.  Antibiotics:  None    HPI/Subjective: No events overnight.   Objective: Filed Vitals:   09/29/13 2016 09/29/13 2157 09/29/13 2158 09/30/13 0652  BP: 137/80 128/78  119/67  Pulse: 79 79  70  Temp: 98.2 F (36.8 C)   97.9 F (36.6 C)  TempSrc: Oral   Oral  Resp: 18 18  20   Height: 6\' 5"  (1.956 m)     Weight: 110.315 kg (243 lb 3.2 oz)     SpO2: 98% 92% 97% 98%    Intake/Output Summary (Last 24 hours) at 09/30/13 0810 Last data filed at 09/30/13 0647  Gross per 24 hour  Intake    240 ml  Output      0 ml  Net    240 ml    Exam:   General:  Pt is alert, follows commands appropriately, not in acute distress  Cardiovascular: Regular rate and rhythm, S1/S2, no murmurs, no rubs, no gallops  Respiratory: Clear to auscultation bilaterally, no wheezing, no crackles, no rhonchi  Abdomen: Soft, non tender, non distended, bowel sounds present, no guarding  Extremities: L>>R LE edema, pulses DP and PT palpable bilaterally  Neuro: Grossly nonfocal  Data Reviewed: Basic Metabolic Panel:  Recent Labs Lab 09/28/13 1910 09/29/13 0610 09/30/13 0550  NA 135 137 135  K 3.9 3.7 4.0  CL 106 108 105  CO2 25 23 22   GLUCOSE 88 86 95  BUN 21 20 21   CREATININE 1.22 1.20 1.41*  CALCIUM 7.2* 7.2* 6.9*   Liver Function Tests:  Recent Labs Lab 09/28/13 1910 09/30/13 0550  AST 20 16  ALT 13 9  ALKPHOS 56 50  BILITOT 0.6 0.4  PROT 6.5 5.9*  ALBUMIN 2.2* 2.0*   No results found for this basename: LIPASE, AMYLASE,  in the last 168 hours No results found for this basename: AMMONIA,  in the last 168 hours CBC:  Recent Labs Lab 09/28/13 1910 09/29/13 0610 09/30/13 0550  WBC 3.4* 3.0* 3.3*  NEUTROABS 1.6*  --   --   HGB 9.5* 9.3* 9.2*  HCT 28.7* 27.6* 27.7*  MCV 83.7 83.1 82.9  PLT 131* 135* 131*    Cardiac Enzymes: No results found for this basename: CKTOTAL, CKMB, CKMBINDEX, TROPONINI,  in the last 168 hours BNP: No components found with this basename: POCBNP,  CBG: No results found for this basename: GLUCAP,  in the last 168 hours  No results found for this or any previous visit (from the past 240 hour(s)).   Scheduled Meds: . sodium chloride  3 mL Intravenous Q12H   Continuous Infusions:    Debbora Presto, MD  [facility] Pager [phone]  If 7PM-7AM, please contact night-coverage [email or web address] Password [email or web address] 09/30/2013, 8:10 AM   LOS: 2 days

## 2013-09-30 NOTE — Progress Notes (Signed)
Patient ID: Austin [name], male   DOB: [birth year], 37 y.o.   MRN: [id number] S:feels tired and fatigued O:BP 119/67  Pulse 70  Temp(Src) 97.9 F (36.6 C) (Oral)  Resp 20  Ht 6\' 5"  (1.956 m)  Wt 110.315 kg (243 lb 3.2 oz)  BMI 28.83 kg/m2  SpO2 98%  Intake/Output Summary (Last 24 hours) at 09/30/13 1041 Last data filed at 09/30/13 0647  Gross per 24 hour  Intake    240 ml  Output      0 ml  Net    240 ml   Intake/Output: I/O last 3 completed shifts: In: 240 [P.O.:240] Out: 0   Intake/Output this shift:    Weight change: -1.497 kg (-3 lb 4.8 oz) Gen:WD WN AAM in NAD CVS:no rub, RRR Resp:cta NWG:NFAOZH Ext:tr pedal edema   Recent Labs Lab 09/28/13 1910 09/29/13 0610 09/30/13 0550  NA 135 137 135  K 3.9 3.7 4.0  CL 106 108 105  CO2 25 23 22   GLUCOSE 88 86 95  BUN 21 20 21   CREATININE 1.22 1.20 1.41*  ALBUMIN 2.2*  --  2.0*  CALCIUM 7.2* 7.2* 6.9*  AST 20  --  16  ALT 13  --  9   Liver Function Tests:  Recent Labs Lab 09/28/13 1910 09/30/13 0550  AST 20 16  ALT 13 9  ALKPHOS 56 50  BILITOT 0.6 0.4  PROT 6.5 5.9*  ALBUMIN 2.2* 2.0*   No results found for this basename: LIPASE, AMYLASE,  in the last 168 hours No results found for this basename: AMMONIA,  in the last 168 hours CBC:  Recent Labs Lab 09/28/13 1910 09/29/13 0610 09/30/13 0550  WBC 3.4* 3.0* 3.3*  NEUTROABS 1.6*  --   --   HGB 9.5* 9.3* 9.2*  HCT 28.7* 27.6* 27.7*  MCV 83.7 83.1 82.9  PLT 131* 135* 131*   Cardiac Enzymes: No results found for this basename: CKTOTAL, CKMB, CKMBINDEX, TROPONINI,  in the last 168 hours CBG: No results found for this basename: GLUCAP,  in the last 168 hours  Iron Studies:  Recent Labs  09/29/13 1424  IRON 27*  TIBC 115*  FERRITIN 753*   Studies/Results: Dg Chest 2 View  09/28/2013   CLINICAL DATA:  Lower extremity swelling.  EXAM: CHEST  2 VIEW  COMPARISON:  None.  FINDINGS: The heart size and mediastinal contours are within normal  limits. Both lungs are clear. The visualized skeletal structures are unremarkable.  IMPRESSION: No active cardiopulmonary disease.   Electronically Signed   By: Roque Lias M.D.   On: 09/28/2013 21:59   . methylPREDNISolone (SOLU-MEDROL) injection  500 mg Intravenous Daily  . pantoprazole  40 mg Oral Daily  . sodium chloride  3 mL Intravenous Q12H    BMET    Component Value Date/Time   NA 135 09/30/2013 0550   K 4.0 09/30/2013 0550   CL 105 09/30/2013 0550   CO2 22 09/30/2013 0550   GLUCOSE 95 09/30/2013 0550   BUN 21 09/30/2013 0550   CREATININE 1.41* 09/30/2013 0550   CALCIUM 6.9* 09/30/2013 0550   GFRNONAA 62* 09/30/2013 0550   GFRAA 71* 09/30/2013 0550   CBC    Component Value Date/Time   WBC 3.3* 09/30/2013 0550   RBC 3.34* 09/30/2013 0550   HGB 9.2* 09/30/2013 0550   HCT 27.7* 09/30/2013 0550   PLT 131* 09/30/2013 0550   MCV 82.9 09/30/2013 0550   MCH 27.5 09/30/2013 0550   MCHC  33.2 09/30/2013 0550   RDW 13.0 09/30/2013 0550   LYMPHSABS 1.4 09/28/2013 1910   MONOABS 0.3 09/28/2013 1910   EOSABS 0.1 09/28/2013 1910   BASOSABS 0.0 09/28/2013 1910     Assessment/Plan:  1. AKI/Recurrent nephrotic syndrome- pt with h/o tip lesion and treated with prolonged corticosteroids and now with a 1 month h/o foamy urine and worsening lower ext edema.  1. Also c/b pancytopenia and migratory arthralgias.  2. Start IV solumedrol for 3 days 3. Renal biopsy for tomorrow with IR 4. have ordered serologies to r/o autoimmune process.  2. Anemia- normocytic. As above has h/o dark stools. Check stool cards, however in light of leukopenia and thrombocytopenia, will need further evaluation. 3. Leukopenia- as above 4. Thrombocytopenia- as above 5. Hyperlipidemia dt NS- will check lipid panel 6. HTN- borderline. Will follow for now 7. dispo- plan for renal biopsy on Monday and possible d/c the next day if his hematologic issues improve. 8.   Yamel Bale A

## 2013-09-30 NOTE — Progress Notes (Signed)
Pt just had vomiting episode in the bathroom after eating lunch.  He had no appetite, but ate a small amount after family encouragement.   Pt was given IV zofran and is now resting comfortably.

## 2013-10-01 ENCOUNTER — Inpatient Hospital Stay (HOSPITAL_COMMUNITY): Payer: Federal, State, Local not specified - PPO

## 2013-10-01 ENCOUNTER — Telehealth: Payer: Self-pay | Admitting: Family Medicine

## 2013-10-01 DIAGNOSIS — N059 Unspecified nephritic syndrome with unspecified morphologic changes: Secondary | ICD-10-CM

## 2013-10-01 DIAGNOSIS — M329 Systemic lupus erythematosus, unspecified: Principal | ICD-10-CM

## 2013-10-01 DIAGNOSIS — N179 Acute kidney failure, unspecified: Secondary | ICD-10-CM

## 2013-10-01 DIAGNOSIS — I10 Essential (primary) hypertension: Secondary | ICD-10-CM

## 2013-10-01 LAB — ANTI-DNA ANTIBODY, DOUBLE-STRANDED: ds DNA Ab: 1760 IU/mL — ABNORMAL HIGH (ref <30)

## 2013-10-01 LAB — RENAL FUNCTION PANEL
BUN: 35 mg/dL — ABNORMAL HIGH (ref 6–23)
CO2: 23 mEq/L (ref 19–32)
Calcium: 7.3 mg/dL — ABNORMAL LOW (ref 8.4–10.5)
Chloride: 103 mEq/L (ref 96–112)
Creatinine, Ser: 1.88 mg/dL — ABNORMAL HIGH (ref 0.50–1.35)
GFR calc Af Amer: 50 mL/min — ABNORMAL LOW (ref >90)
GFR calc non Af Amer: 43 mL/min — ABNORMAL LOW (ref >90)
Sodium: 133 mEq/L — ABNORMAL LOW (ref 135–145)

## 2013-10-01 LAB — CBC
HCT: 27.8 % — ABNORMAL LOW (ref 39.0–52.0)
Hemoglobin: 9.5 g/dL — ABNORMAL LOW (ref 13.0–17.0)
MCHC: 34.2 g/dL (ref 30.0–36.0)
MCV: 81.5 fL (ref 78.0–100.0)
RBC: 3.41 MIL/uL — ABNORMAL LOW (ref 4.22–5.81)
WBC: 4 10*3/uL (ref 4.0–10.5)

## 2013-10-01 LAB — APTT: aPTT: 29 seconds (ref 24–37)

## 2013-10-01 LAB — MPO/PR-3 (ANCA) ANTIBODIES
Myeloperoxidase Abs: 8 AU/mL (ref <20)
Serine Protease 3: 2 AU/mL (ref <20)

## 2013-10-01 LAB — ANA: Anti Nuclear Antibody(ANA): POSITIVE — AB

## 2013-10-01 LAB — ANTI-NUCLEAR AB-TITER (ANA TITER)

## 2013-10-01 LAB — PROTIME-INR
INR: 0.98 (ref 0.00–1.49)
Prothrombin Time: 12.8 seconds (ref 11.6–15.2)

## 2013-10-01 MED ORDER — BISACODYL 5 MG PO TBEC: 5.0000 mg | DELAYED_RELEASE_TABLET | Freq: Every day | ORAL | Status: DC | PRN

## 2013-10-01 MED ORDER — HYDROCODONE-ACETAMINOPHEN 5-325 MG PO TABS: 1.0000 | ORAL_TABLET | ORAL | Status: DC | PRN

## 2013-10-01 MED ORDER — MIDAZOLAM HCL 2 MG/2ML IJ SOLN
INTRAMUSCULAR | Status: AC
  Filled 2013-10-01: qty 4

## 2013-10-01 MED ORDER — BISACODYL 5 MG PO TBEC
5.0000 mg | DELAYED_RELEASE_TABLET | Freq: Three times a day (TID) | ORAL | Status: DC | PRN
  Administered 2013-10-01: 5 mg via ORAL
  Filled 2013-10-01: qty 1

## 2013-10-01 MED ORDER — PREDNISONE 50 MG PO TABS: 60.0000 mg | ORAL_TABLET | Freq: Every day | ORAL | Status: DC

## 2013-10-01 MED ORDER — FENTANYL CITRATE 0.05 MG/ML IJ SOLN
INTRAMUSCULAR | Status: AC | PRN
  Administered 2013-10-01: 50 ug via INTRAVENOUS
  Administered 2013-10-01 (×2): 25 ug via INTRAVENOUS

## 2013-10-01 MED ORDER — MIDAZOLAM HCL 2 MG/2ML IJ SOLN
INTRAMUSCULAR | Status: AC | PRN
  Administered 2013-10-01: 1 mg via INTRAVENOUS
  Administered 2013-10-01: 2 mg via INTRAVENOUS

## 2013-10-01 MED ORDER — FENTANYL CITRATE 0.05 MG/ML IJ SOLN
INTRAMUSCULAR | Status: AC
  Filled 2013-10-01: qty 2

## 2013-10-01 NOTE — Progress Notes (Signed)
10/27/201 Patient's eyes are red, did pass it on to third shift nurse. [facility] RN.

## 2013-10-01 NOTE — Procedures (Signed)
Interventional Radiology Procedure Note  Procedure: US guided core biopsy of left kidney Complications: None immediate Recommendations: - Bedrest x 4 hrs - May ADAT after 2 hrs - Pathology pending  Signed,  Sterling Big, MD [facility]

## 2013-10-01 NOTE — Progress Notes (Signed)
10/01/2013 gave influenza an pneumococcal information to patient to read, he's unsure if he wants the vaccine or not. Hyman Crossan WellingtonRN.

## 2013-10-01 NOTE — Progress Notes (Signed)
Assessment/Plan:  1. Recurrent nephrotic syndrome w/ AKI- pt with h/o tip lesion and treated with prolonged corticosteroids.  He does have some family members with autoimmune diseases ie. Lupus and one with MS, as well as a cousin on HD.  2. Anemia, leukopenia and thrombocytopenia, will need further evaluation. 3. Hyperlipidemia dt NS- will check lipid panel 4. HTN- borderline. Will follow for now   - Plan for renal biopsy today. Solumedrol for 3 days, then Prednisone. F/U serologies  Subjective: Interval History: rash under eyes, feels OK   Objective: Vital signs in last 24 hours: Temp:  [98.2 F (36.8 C)-98.7 F (37.1 C)] 98.2 F (36.8 C) (10/27 0639) Pulse Rate:  [67-73] 68 (10/27 0639) Resp:  [16-18] 17 (10/27 0639) BP: (119-131)/(61-75) 126/61 mmHg (10/27 0639) SpO2:  [90 %-96 %] 96 % (10/27 0639) Weight change:   Intake/Output from previous day: 10/26 0701 - 10/27 0700 In: 600 [P.O.:600] Out: 0  Intake/Output this shift:    General appearance: alert and cooperative Head: atraumatic, dry skin on face with erythema infraorbital region, conjunctival injection Resp: clear to auscultation bilaterally Chest wall: no tenderness GI: soft, non-tender; bowel sounds normal; no masses,  no organomegaly Extremities: extremities normal, atraumatic, no cyanosis or edema  Lab Results:  Recent Labs  09/30/13 0550 09/30/13 1858 10/01/13 0525  WBC 3.3*  --  4.0  HGB 9.2*  --  9.5*  HCT 27.7*  --  27.8*  PLT 131* 135* 131*   BMET:  Recent Labs  09/30/13 0550 10/01/13 0525  NA 135 133*  K 4.0 4.4  CL 105 103  CO2 22 23  GLUCOSE 95 136*  BUN 21 35*  CREATININE 1.41* 1.88*  CALCIUM 6.9* 7.3*   No results found for this basename: PTH,  in the last 72 hours Iron Studies:  Recent Labs  09/29/13 1424  IRON 27*  TIBC 115*  FERRITIN 753*   Studies/Results: No results found.  Scheduled: . methylPREDNISolone (SOLU-MEDROL) injection  500 mg Intravenous Daily  .  pantoprazole  40 mg Oral Daily  . sodium chloride  3 mL Intravenous Q12H    LOS: 3 days   Lavene Penagos C 10/01/2013,8:22 AM  .

## 2013-10-01 NOTE — Progress Notes (Signed)
[facility] PROGRESS NOTE  Austin [name] AVW:[id number] DOB: February 27, 1976 DOA: 09/28/2013 PCP: Austin Covey, MD  Assessment/Plan: - Recurrent nephrotic syndrome w/ AKI; pt with h/o tip lesion and treated with prolonged corticosteroids. He does have some family members with autoimmune diseases ie. Lupus and one with MS, as well as a cousin on HD. Treat per nephrology recommendations  Anemia/ leukopenia/ thrombocytopenia; obtain anemia workup, we'll consider bone marrow biopsy    Hyperlipidemia; will check lipid panel   HTN- borderline. Currently within AH A. guidelines and continue to monitor   Anemia of chronic disease; patient's anemia panel consistent with anemia of chronic disease. -Obtain erythropoietin, reticulocyte count, Coombs test  Systemic lupus; patient ANA positive, double-stranded DNA positive; this is consistent with lupus; currently being treated with prednisone and methylprednisolone which would also treat flare of lupus  Code Status: Full  Famiily Communication:  Disposition Plan: Per nephrology   Consultants:  Nephrology  Procedures: Left renal biopsy 10/01/2013; results pending  Renal ultrasound 10/01/2013 No acute findings. No hydronephrosis. Normal echotexture.  Small bilateral pleural effusions and small amount of perihepatic  ascites.  Antibiotics:   HPI/Subjective: [name] PMHx  HTN, anemia of chronic disease, pancytopenia, acute renal failure h/o tip focal segmental glomerularnephritis per bx done at [facility] in 2003 (pt has records with him from [facility]) was on steroids for about 2.5 years then weaned off. Has not had any problems since then. Has had scattered follow up. Over the last 2-3 weeks he has noted his urine is darker in color and has gained about 30 lbs , he is having joint pain and swelling in both hands, and feet. No fevers. No n/v/d. No cough. No sob. Vss. Not established with a nephrologist locally, and cannot get appt for 3  weeks. Asked to admit for nephrology consultation in am. 10/01/2013 States negative pain except for mild lateral left lumbar area pain secondary to biopsy. Negative CPE, negative SOB, negative abdominal pain.     Objective: Filed Vitals:   10/01/13 1444 10/01/13 1700 10/01/13 2117 10/02/13 0519  BP: 125/79 115/70 147/92 128/85  Pulse: 75 71 69 3  Temp:  98.3 F (36.8 C) 98.6 F (37 C) 97.8 F (36.6 C)  TempSrc: Oral Oral Oral Oral  Resp: 18 18 18 18   Height:   6\' 5"  (1.956 m)   Weight:   110.904 kg (244 lb 8 oz)   SpO2: 91% 94% 91% 96%    Intake/Output Summary (Last 24 hours) at 10/02/13 0716 Last data filed at 10/01/13 2118  Gross per 24 hour  Intake     60 ml  Output      0 ml  Net     60 ml   Filed Weights   09/28/13 2306 09/29/13 2016 10/01/13 2117  Weight: 110.315 kg (243 lb 3.2 oz) 110.315 kg (243 lb 3.2 oz) 110.904 kg (244 lb 8 oz)    Exam:   General:  A/O x 4, NAD  Cardiovascular: RRR, (-) M/R/G, DP/PT pulse 2 + bilat  Respiratory: CTA bilat  Abdomen: Soft , NT, ND, (+) BS  Musculoskeletal: (-) Pedal Edema, (+) mild Lt Lower back pain, (+) bruising consistent with biopsy. Negative sign of infection   Data Reviewed: Basic Metabolic Panel:  Recent Labs Lab 09/28/13 1910 09/29/13 0610 09/30/13 0550 10/01/13 0525 10/02/13 0406  NA 135 137 135 133* 134*  K 3.9 3.7 4.0 4.4 4.8  CL 106 108 105 103 105  CO2 25 23 22  23  22  GLUCOSE 88 86 95 136* 136*  BUN 21 20 21  35* 48*  CREATININE 1.22 1.20 1.41* 1.88* 1.69*  CALCIUM 7.2* 7.2* 6.9* 7.3* 7.0*  PHOS  --   --   --  4.0 4.2   Liver Function Tests:  Recent Labs Lab 09/28/13 1910 09/30/13 0550 10/01/13 0525 10/02/13 0406  AST 20 16  --   --   ALT 13 9  --   --   ALKPHOS 56 50  --   --   BILITOT 0.6 0.4  --   --   PROT 6.5 5.9*  --   --   ALBUMIN 2.2* 2.0* 1.9* 1.9*   No results found for this basename: LIPASE, AMYLASE,  in the last 168 hours No results found for this basename: AMMONIA,  in  the last 168 hours CBC:  Recent Labs Lab 09/28/13 1910 09/29/13 0610 09/30/13 0550 09/30/13 1858 10/01/13 0525  WBC 3.4* 3.0* 3.3*  --  4.0  NEUTROABS 1.6*  --   --   --   --   HGB 9.5* 9.3* 9.2*  --  9.5*  HCT 28.7* 27.6* 27.7*  --  27.8*  MCV 83.7 83.1 82.9  --  81.5  PLT 131* 135* 131* 135* 131*   Cardiac Enzymes: No results found for this basename: CKTOTAL, CKMB, CKMBINDEX, TROPONINI,  in the last 168 hours BNP (last 3 results)  Recent Labs  09/28/13 1910  PROBNP 361.9*   CBG: No results found for this basename: GLUCAP,  in the last 168 hours  No results found for this or any previous visit (from the past 240 hour(s)).   Studies: US Renal  10/01/2013   CLINICAL DATA:  Nephrotic syndrome.  EXAM: RENAL/URINARY TRACT ULTRASOUND COMPLETE  COMPARISON:  None.  FINDINGS: Right Kidney  Length: 12.5 cm. Echogenicity within normal limits. No mass or hydronephrosis visualized.  Left Kidney  Length: 14.3 cm. Echogenicity within normal limits. No mass or hydronephrosis visualized.  Bladder  Appears normal for degree of bladder distention.  Incidentally noted are small bilateral effusions and small amount of ascites near the liver.  IMPRESSION: No acute findings. No hydronephrosis.  Normal echotexture.  Small bilateral pleural effusions and small amount of perihepatic ascites.   Electronically Signed   By: Austin [name] M.D.   On: 10/01/2013 14:38   US Biopsy  10/01/2013   CLINICAL DATA:  37 year old male with recurrent nephrotic syndrome. Percutaneous renal biopsy is requested to facilitate diagnosis.  EXAM: ULTRASOUND BIOPSY CORE LIVER  Date: 10/01/2013  TECHNIQUE: Informed consent was obtained from the patient following explanation of the procedure, risks, benefits and alternatives. The patient understands, agrees and consents for the procedure. All questions were addressed. A time out was performed.  Maximal barrier sterile technique utilized including caps, mask, sterile gowns,  sterile gloves, large sterile drape, hand hygiene, and Betadine skin prep.  The left flank was interrogated with ultrasound. The left kidney was identified. There is a clear path to the lower pole. Local anesthesia was attained by infiltration with 1% lidocaine. A small dermatotomy was made with a 11. Blade. Under direct sonographic guidance, multiple 16 gauge core biopsies were obtained of the lower pole cortex using a Bard automated biopsy device.  Post biopsy ultrasound imaging demonstrates no significant perinephric hematoma or other complication. There was no immediate complication, the patient tolerated the procedure well.  ANESTHESIA/SEDATION: Moderate (conscious) sedation was used. Three mg Versed, 100 mcg Fentanyl were administered intravenously. The patient's vital signs  were monitored continuously by radiology nursing throughout the procedure.  Sedation Time: 20 minutes  CONTRAST:  None  FLUOROSCOPY TIME:  None  PROCEDURE: 1. Ultrasound-guided biopsy of the left kidney (lower pole cortex) Interventional Radiologist:  Sterling Big, MD  IMPRESSION: Successful random biopsy of the lower pole cortex of the left kidney.  Signed,  Sterling Big, MD  [facility]  [facility]   Electronically Signed   By: Malachy Moan M.D.   On: 10/01/2013 16:50    Scheduled Meds: . methylPREDNISolone (SOLU-MEDROL) injection  500 mg Intravenous Daily  . pantoprazole  40 mg Oral Daily  . [START ON 10/04/2013] predniSONE  60 mg Oral Q breakfast  . sodium chloride  3 mL Intravenous Q12H   Continuous Infusions:   Principal Problem:   Nephrotic syndrome, focal and segmental glomerular lesions Active Problems:   HYPERTENSION   Joint pain   Focal segmental glomerulosclerosis, tip variant with nephrosis   Other pancytopenia   Anemia of chronic disease   Acute renal failure   Lupus (systemic lupus erythematosus)    Time spent: 40 minutes    WOODS,  CURTIS, J  [facility] Pager [phone]. If 7PM-7AM, please contact night-coverage at [email or web address], password [facility] 10/02/2013, 7:16 AM  LOS: 4 days

## 2013-10-01 NOTE — Telephone Encounter (Signed)
[facility] Triage Call Report Triage Record Num: [id number] Operator: Albertine Grates Patient Name: Austin [name] Call Date & Time: 09/28/2013 5:51:57PM Patient Phone: [phone] PCP: Evelena Peat Patient Gender: Male PCP Fax : [phone] Patient DOB: [birth year] [facility] Name: [facility] Reason for Call: Caller: Phineas/Patient; PCP: Evelena Peat (Family Practice); CB#: [phone]; Has edema in ankle since 10-21. Has wound on ankle that developed since helping move a friend. Has numbness in foot as well. Per Ankle Injury protocol, advised ED per nursing judgement. Protocol(s) Used: Ankle Injury Recommended Outcome per Protocol: See ED Immediately Reason for Outcome: New change in sensation (numb, tingling, or no feeling), change in skin color (pale or blue), feels cool to the touch, severe pain or no pulse below level of injury. Care Advice: ~ 10/

## 2013-10-02 DIAGNOSIS — E871 Hypo-osmolality and hyponatremia: Secondary | ICD-10-CM | POA: Diagnosis present

## 2013-10-02 DIAGNOSIS — M329 Systemic lupus erythematosus, unspecified: Secondary | ICD-10-CM | POA: Diagnosis present

## 2013-10-02 HISTORY — DX: Hypo-osmolality and hyponatremia: E87.1

## 2013-10-02 LAB — COMPREHENSIVE METABOLIC PANEL
Albumin: 2.1 g/dL — ABNORMAL LOW (ref 3.5–5.2)
Alkaline Phosphatase: 55 U/L (ref 39–117)
BUN: 50 mg/dL — ABNORMAL HIGH (ref 6–23)
CO2: 22 mEq/L (ref 19–32)
Chloride: 101 mEq/L (ref 96–112)
Creatinine, Ser: 1.6 mg/dL — ABNORMAL HIGH (ref 0.50–1.35)
GFR calc Af Amer: 61 mL/min — ABNORMAL LOW (ref >90)
GFR calc non Af Amer: 53 mL/min — ABNORMAL LOW (ref >90)
Glucose, Bld: 118 mg/dL — ABNORMAL HIGH (ref 70–99)
Potassium: 4.1 mEq/L (ref 3.5–5.1)
Total Bilirubin: 0.4 mg/dL (ref 0.3–1.2)
Total Protein: 6.4 g/dL (ref 6.0–8.3)

## 2013-10-02 LAB — RENAL FUNCTION PANEL
Albumin: 1.9 g/dL — ABNORMAL LOW (ref 3.5–5.2)
CO2: 22 mEq/L (ref 19–32)
Calcium: 7 mg/dL — ABNORMAL LOW (ref 8.4–10.5)
Chloride: 105 mEq/L (ref 96–112)
GFR calc Af Amer: 57 mL/min — ABNORMAL LOW (ref >90)
GFR calc non Af Amer: 49 mL/min — ABNORMAL LOW (ref >90)
Glucose, Bld: 136 mg/dL — ABNORMAL HIGH (ref 70–99)
Potassium: 4.8 mEq/L (ref 3.5–5.1)
Sodium: 134 mEq/L — ABNORMAL LOW (ref 135–145)

## 2013-10-02 LAB — CBC WITH DIFFERENTIAL/PLATELET
Basophils Absolute: 0 10*3/uL (ref 0.0–0.1)
Hemoglobin: 9.9 g/dL — ABNORMAL LOW (ref 13.0–17.0)
Lymphs Abs: 1.6 10*3/uL (ref 0.7–4.0)
MCV: 80.9 fL (ref 78.0–100.0)
Monocytes Absolute: 0.8 10*3/uL (ref 0.1–1.0)
Monocytes Relative: 8 % (ref 3–12)
Neutrophils Relative %: 75 % (ref 43–77)
Platelets: 168 10*3/uL (ref 150–400)
RDW: 13.2 % (ref 11.5–15.5)
WBC: 9.5 10*3/uL (ref 4.0–10.5)

## 2013-10-02 LAB — LACTATE DEHYDROGENASE: LDH: 245 U/L (ref 94–250)

## 2013-10-02 LAB — RETICULOCYTES
RBC.: 3.51 MIL/uL — ABNORMAL LOW (ref 4.22–5.81)
Retic Count, Absolute: 28.1 10*3/uL (ref 19.0–186.0)
Retic Ct Pct: 0.8 % (ref 0.4–3.1)

## 2013-10-02 MED ORDER — AMLODIPINE BESYLATE 10 MG PO TABS
10.0000 mg | ORAL_TABLET | Freq: Every day | ORAL | Status: DC
  Administered 2013-10-02: 10 mg via ORAL
  Filled 2013-10-02 (×2): qty 1

## 2013-10-02 MED ORDER — AMLODIPINE BESYLATE 10 MG PO TABS: 10.0000 mg | ORAL_TABLET | Freq: Every day | ORAL | Status: DC

## 2013-10-02 MED ORDER — PANTOPRAZOLE SODIUM 40 MG PO TBEC: 40.0000 mg | DELAYED_RELEASE_TABLET | Freq: Two times a day (BID) | ORAL | Status: DC

## 2013-10-02 MED ORDER — PREDNISONE 20 MG PO TABS: 60.0000 mg | ORAL_TABLET | Freq: Every day | ORAL | Status: DC

## 2013-10-02 MED ORDER — PREDNISONE 50 MG PO TABS
60.0000 mg | ORAL_TABLET | Freq: Every day | ORAL | Status: DC
  Filled 2013-10-02: qty 1

## 2013-10-02 NOTE — Discharge Summary (Signed)
Physician Discharge Summary  BRANTLEY WILEY UJW:[id number] DOB: 1976/05/11 DOA: 09/28/2013  PCP: Kristian Covey, MD  Admit date: 09/28/2013 Discharge date: 10/02/2013  Time spent: 40 minutes  Recommendations for Outpatient Follow-up:   - Recurrent nephrotic syndrome w/ AKI; pt with h/o tip lesion and treated with prolonged corticosteroids. He does have some family members with autoimmune diseases ie. Lupus and one with MS, as well as a cousin on HD. Nephrology recommendations;  - Rec Prednisone 60 mg per day. Begin 10/29. F/u with Dr.              Arrie Aran for biopsy results and additional med.  - Appt. Dr. Arrie Aran on Nov. 6 At 8 am at [facility]    Anemia/ leukopenia/ thrombocytopenia; obtained anemia workup. Followup with PCP and Dr. Arrie Aran.to determine if bone marrow biopsy required.   Hyperlipidemia; within NCEP guidelines    HTN-not within [facility] guidelines and with the addition of high-dose steroids more than likely will increase. Will start patient on amlodipine (appropriate ACE inhibitors/ARB 2dary  to renal disease)   Anemia of chronic disease; patient's anemia panel consistent with anemia of chronic disease.  -Eythropoietin; pending -Reticulocyte count= 3.5(L) -Coombs test; IgG positive -LDH=245 normal   Systemic lupus; patient ANA positive, double-stranded DNA positive; this is consistent with lupus;  -Continue high-dose prednisone on discharge    Hyponatremia; asymptomatic patient can address with his PCP at follow up    Discharge Diagnoses:  Principal Problem:   Nephrotic syndrome, focal and segmental glomerular lesions Active Problems:   HYPERTENSION   Joint pain   Focal segmental glomerulosclerosis, tip variant with nephrosis   Other pancytopenia   Anemia of chronic disease   Acute renal failure   Lupus (systemic lupus erythematosus)   Discharge Condition: Stable  Diet recommendation: AHA  [facility] Weights   09/28/13 2306 09/29/13 2016  10/01/13 2117  Weight: 110.315 kg (243 lb 3.2 oz) 110.315 kg (243 lb 3.2 oz) 110.904 kg (244 lb 8 oz)    History of present illness:  [name] PMHx HTN, anemia of chronic disease, pancytopenia, acute renal failure h/o tip focal segmental glomerularnephritis per bx done at [facility] in 2003 (pt has records with him from [facility]) was on steroids for about 2.5 years then weaned off. Has not had any problems since then. Has had scattered follow up. Over the last 2-3 weeks he has noted his urine is darker in color and has gained about 30 lbs , he is having joint pain and swelling in both hands, and feet. No fevers. No n/v/d. No cough. No sob. Vss. Not established with a nephrologist locally, and cannot get appt for 3 weeks. Asked to admit for nephrology consultation in am. 10/01/2013 States negative pain except for mild lateral left lumbar area pain secondary to biopsy. Negative CPE, negative SOB, negative abdominal pain. TODAY patient still has minor soreness in his left CVA area, however ready for discharge    Consultants:  Nephrology    Procedures:  Left renal biopsy 10/01/2013; results pending  Renal ultrasound 10/01/2013  No acute findings. No hydronephrosis. Normal echotexture.  Small bilateral pleural effusions and small amount of perihepatic  ascites.    Discharge Exam: Filed Vitals:   10/01/13 1700 10/01/13 2117 10/02/13 0519 10/02/13 1055  BP: 115/70 147/92 128/85 145/95  Pulse: 71 69 3 65  Temp: 98.3 F (36.8 C) 98.6 F (37 C) 97.8 F (36.6 C) 97.7 F (36.5 C)  TempSrc: Oral Oral Oral Oral  Resp: 18  18 18 20   Height:  6\' 5"  (1.956 m)    Weight:  110.904 kg (244 lb 8 oz)    SpO2: 94% 91% 96% 98%   General: A/O x 4, NAD  Cardiovascular: RRR, (-) M/R/G, DP/PT pulse 2 + bilat  Respiratory: CTA bilat  Abdomen: Soft , NT, ND, (+) BS  Musculoskeletal: (-) Pedal Edema, (+) mild Lt Lower back pain, (+) bruising consistent with biopsy. Negative sign of infection   Discharge  Instructions     Medication List    Notice   You have not been prescribed any medications.     No Known Allergies    The results of significant diagnostics from this hospitalization (including imaging, microbiology, ancillary and laboratory) are listed below for reference.    Significant Diagnostic Studies: Dg Chest 2 View  09/28/2013   CLINICAL DATA:  Lower extremity swelling.  EXAM: CHEST  2 VIEW  COMPARISON:  None.  FINDINGS: The heart size and mediastinal contours are within normal limits. Both lungs are clear. The visualized skeletal structures are unremarkable.  IMPRESSION: No active cardiopulmonary disease.   Electronically Signed   By: Roque Lias M.D.   On: 09/28/2013 21:59   US Renal  10/01/2013   CLINICAL DATA:  Nephrotic syndrome.  EXAM: RENAL/URINARY TRACT ULTRASOUND COMPLETE  COMPARISON:  None.  FINDINGS: Right Kidney  Length: 12.5 cm. Echogenicity within normal limits. No mass or hydronephrosis visualized.  Left Kidney  Length: 14.3 cm. Echogenicity within normal limits. No mass or hydronephrosis visualized.  Bladder  Appears normal for degree of bladder distention.  Incidentally noted are small bilateral effusions and small amount of ascites near the liver.  IMPRESSION: No acute findings. No hydronephrosis.  Normal echotexture.  Small bilateral pleural effusions and small amount of perihepatic ascites.   Electronically Signed   By: Charlett Nose M.D.   On: 10/01/2013 14:38   US Biopsy  10/01/2013   CLINICAL DATA:  37 year old male with recurrent nephrotic syndrome. Percutaneous renal biopsy is requested to facilitate diagnosis.  EXAM: ULTRASOUND BIOPSY CORE LIVER  Date: 10/01/2013  TECHNIQUE: Informed consent was obtained from the patient following explanation of the procedure, risks, benefits and alternatives. The patient understands, agrees and consents for the procedure. All questions were addressed. A time out was performed.  Maximal barrier sterile technique utilized  including caps, mask, sterile gowns, sterile gloves, large sterile drape, hand hygiene, and Betadine skin prep.  The left flank was interrogated with ultrasound. The left kidney was identified. There is a clear path to the lower pole. Local anesthesia was attained by infiltration with 1% lidocaine. A small dermatotomy was made with a 11. Blade. Under direct sonographic guidance, multiple 16 gauge core biopsies were obtained of the lower pole cortex using a Bard automated biopsy device.  Post biopsy ultrasound imaging demonstrates no significant perinephric hematoma or other complication. There was no immediate complication, the patient tolerated the procedure well.  ANESTHESIA/SEDATION: Moderate (conscious) sedation was used. Three mg Versed, 100 mcg Fentanyl were administered intravenously. The patient's vital signs were monitored continuously by radiology nursing throughout the procedure.  Sedation Time: 20 minutes  CONTRAST:  None  FLUOROSCOPY TIME:  None  PROCEDURE: 1. Ultrasound-guided biopsy of the left kidney (lower pole cortex) Interventional Radiologist:  Sterling Big, MD  IMPRESSION: Successful random biopsy of the lower pole cortex of the left kidney.  Signed,  Sterling Big, MD  [facility]  [facility]   Electronically Signed  By: Malachy Moan M.D.   On: 10/01/2013 16:50    Microbiology: No results found for this or any previous visit (from the past 240 hour(s)).   Labs: Basic Metabolic Panel:  Recent Labs Lab 09/29/13 0610 09/30/13 0550 10/01/13 0525 10/02/13 0406 10/02/13 0745  NA 137 135 133* 134* 129*  K 3.7 4.0 4.4 4.8 4.1  CL 108 105 103 105 101  CO2 23 22 23 22 22   GLUCOSE 86 95 136* 136* 118*  BUN 20 21 35* 48* 50*  CREATININE 1.20 1.41* 1.88* 1.69* 1.60*  CALCIUM 7.2* 6.9* 7.3* 7.0* 7.2*  PHOS  --   --  4.0 4.2  --    Liver Function Tests:  Recent Labs Lab 09/28/13 1910 09/30/13 0550 10/01/13 0525  10/02/13 0406 10/02/13 0745  AST 20 16  --   --  16  ALT 13 9  --   --  10  ALKPHOS 56 50  --   --  55  BILITOT 0.6 0.4  --   --  0.4  PROT 6.5 5.9*  --   --  6.4  ALBUMIN 2.2* 2.0* 1.9* 1.9* 2.1*   No results found for this basename: LIPASE, AMYLASE,  in the last 168 hours No results found for this basename: AMMONIA,  in the last 168 hours CBC:  Recent Labs Lab 09/28/13 1910 09/29/13 0610 09/30/13 0550 09/30/13 1858 10/01/13 0525 10/02/13 0745  WBC 3.4* 3.0* 3.3*  --  4.0 9.5  NEUTROABS 1.6*  --   --   --   --  7.1  HGB 9.5* 9.3* 9.2*  --  9.5* 9.9*  HCT 28.7* 27.6* 27.7*  --  27.8* 28.4*  MCV 83.7 83.1 82.9  --  81.5 80.9  PLT 131* 135* 131* 135* 131* 168   Cardiac Enzymes: No results found for this basename: CKTOTAL, CKMB, CKMBINDEX, TROPONINI,  in the last 168 hours BNP: BNP (last 3 results)  Recent Labs  09/28/13 1910  PROBNP 361.9*   CBG: No results found for this basename: GLUCAP,  in the last 168 hours     Signed:  Carolyne Littles, MD [facility] [phone] pager

## 2013-10-02 NOTE — Progress Notes (Signed)
Assessment/Plan:  1. 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 Anemia, leukopenia and thrombocytopenia, prob due to SLE 3. Hyperlipidemia dt NS-  4. HTN- borderline. Will follow for now   - Rec Prednisone 60 mg per day. Begin 10/29.  F/u with Dr. Arrie [name] for biopsy results and additional med.       Will need to monitor BP, prob will need tx. Pt stable from renal perspective and we will sign off.  I have no objection to his discharge.      Appt. Dr. Arrie [name] on Nov. 6  At 8 am at [facility]  Subjective: Interval History: Feels better.  Objective: Vital signs in last 24 hours: Temp:  [97.4 F (36.3 [name])-98.6 F (37 [name])] 97.8 F (36.6 [name]) (10/28 0519) Pulse Rate:  [3-75] 3 (10/28 0519) Resp:  [10-20] 18 (10/28 0519) BP: (115-147)/(52-92) 128/85 mmHg (10/28 0519) SpO2:  [91 %-99 %] 96 % (10/28 0519) Weight:  [110.904 kg (244 lb 8 oz)] 110.904 kg (244 lb 8 oz) (10/27 2117) Weight change:   Intake/Output from previous day: 10/27 0701 - 10/28 0700 In: 60 [P.O.:60] Out: -  Intake/Output this shift:    Incision/Wound: No back hematoma or significant LE edema  Lab Results:  Recent Labs  10/01/13 0525 10/02/13 0745  WBC 4.0 9.5  HGB 9.5* 9.9*  HCT 27.8* 28.4*  PLT 131* 168   BMET:  Recent Labs  10/02/13 0406 10/02/13 0745  NA 134* 129*  K 4.8 4.1  CL 105 101  CO2 22 22  GLUCOSE 136* 118*  BUN 48* 50*  CREATININE 1.69* 1.60*  CALCIUM 7.0* 7.2*   No results found for this basename: PTH,  in the last 72 hours Iron Studies:  Recent Labs  09/29/13 1424  IRON 27*  TIBC 115*  FERRITIN 753*   Studies/Results: US Renal  10/01/2013   CLINICAL DATA:  Nephrotic syndrome.  EXAM: RENAL/URINARY TRACT ULTRASOUND COMPLETE  COMPARISON:  None.  FINDINGS: Right Kidney  Length: 12.5 cm. Echogenicity within normal limits. No mass or hydronephrosis visualized.  Left Kidney  Length: 14.3 cm. Echogenicity within normal limits. No mass or hydronephrosis visualized.  Bladder   Appears normal for degree of bladder distention.  Incidentally noted are small bilateral effusions and small amount of ascites near the liver.  IMPRESSION: No acute findings. No hydronephrosis.  Normal echotexture.  Small bilateral pleural effusions and small amount of perihepatic ascites.   Electronically Signed   By: Charlett Nose M.D.   On: 10/01/2013 14:38   US Biopsy  10/01/2013   CLINICAL DATA:  37 year old male with recurrent nephrotic syndrome. Percutaneous renal biopsy is requested to facilitate diagnosis.  EXAM: ULTRASOUND BIOPSY CORE LIVER  Date: 10/01/2013  TECHNIQUE: Informed consent was obtained from the patient following explanation of the procedure, risks, benefits and alternatives. The patient understands, agrees and consents for the procedure. All questions were addressed. A time out was performed.  Maximal barrier sterile technique utilized including caps, mask, sterile gowns, sterile gloves, large sterile drape, hand hygiene, and Betadine skin prep.  The left flank was interrogated with ultrasound. The left kidney was identified. There is a clear path to the lower pole. Local anesthesia was attained by infiltration with 1% lidocaine. A small dermatotomy was made with a 11. Blade. Under direct sonographic guidance, multiple 16 gauge core biopsies were obtained of the lower pole cortex using a Bard automated biopsy device.  Post biopsy ultrasound imaging demonstrates no significant perinephric hematoma or other complication. There was no  immediate complication, the patient tolerated the procedure well.  ANESTHESIA/SEDATION: Moderate (conscious) sedation was used. Three mg Versed, 100 mcg Fentanyl were administered intravenously. The patient's vital signs were monitored continuously by radiology nursing throughout the procedure.  Sedation Time: 20 minutes  CONTRAST:  None  FLUOROSCOPY TIME:  None  PROCEDURE: 1. Ultrasound-guided biopsy of the left kidney (lower pole cortex) Interventional  Radiologist:  Sterling Big, MD  IMPRESSION: Successful random biopsy of the lower pole cortex of the left kidney.  Signed,  Sterling Big, MD  [facility]  [facility]   Electronically Signed   By: Malachy Moan M.D.   On: 10/01/2013 16:50    Scheduled: . methylPREDNISolone (SOLU-MEDROL) injection  500 mg Intravenous Daily  . pantoprazole  40 mg Oral Daily  . [START ON 10/04/2013] predniSONE  60 mg Oral Q breakfast  . sodium chloride  3 mL Intravenous Q12H     LOS: 4 days   Austin [name] 10/02/2013,10:19 AM

## 2013-10-03 LAB — C3 COMPLEMENT: C3 Complement: 24 mg/dL — ABNORMAL LOW (ref 90–180)

## 2013-10-03 LAB — C4 COMPLEMENT: Complement C4, Body Fluid: 2 mg/dL — ABNORMAL LOW (ref 10–40)

## 2013-10-03 LAB — DIRECT ANTIGLOBULIN TEST (NOT AT ARMC)

## 2013-10-03 LAB — ANTISTREPTOLYSIN O TITER: ASO: 63 IU/mL (ref <409)

## 2013-10-04 LAB — ADAMTS13 ACTIVITY

## 2013-10-05 LAB — PROTEIN, URINE, 24 HOUR

## 2013-10-15 ENCOUNTER — Encounter (HOSPITAL_COMMUNITY): Payer: Self-pay

## 2013-10-25 ENCOUNTER — Encounter: Payer: Self-pay | Admitting: Family Medicine

## 2013-10-25 ENCOUNTER — Ambulatory Visit (INDEPENDENT_AMBULATORY_CARE_PROVIDER_SITE_OTHER): Payer: Federal, State, Local not specified - PPO | Admitting: Family Medicine

## 2013-10-25 VITALS — BP 120/78 | HR 87 | Temp 98.1°F | Wt 217.0 lb

## 2013-10-25 DIAGNOSIS — N042 Nephrotic syndrome with diffuse membranous glomerulonephritis: Secondary | ICD-10-CM

## 2013-10-25 DIAGNOSIS — M329 Systemic lupus erythematosus, unspecified: Secondary | ICD-10-CM

## 2013-10-25 DIAGNOSIS — N032 Chronic nephritic syndrome with diffuse membranous glomerulonephritis: Secondary | ICD-10-CM

## 2013-10-25 DIAGNOSIS — I10 Essential (primary) hypertension: Secondary | ICD-10-CM

## 2013-10-25 DIAGNOSIS — N041 Nephrotic syndrome with focal and segmental glomerular lesions: Secondary | ICD-10-CM

## 2013-10-25 DIAGNOSIS — R739 Hyperglycemia, unspecified: Secondary | ICD-10-CM

## 2013-10-25 DIAGNOSIS — R7309 Other abnormal glucose: Secondary | ICD-10-CM

## 2013-10-25 DIAGNOSIS — D638 Anemia in other chronic diseases classified elsewhere: Secondary | ICD-10-CM

## 2013-10-25 LAB — GLUCOSE, POCT (MANUAL RESULT ENTRY): POC Glucose: 128 mg/dl — AB (ref 70–99)

## 2013-10-25 NOTE — Progress Notes (Signed)
Subjective:    Patient ID: Austin [name], male    DOB: 06-Oct-1976, 37 y.o.   MRN: [id number]  HPI Patient seen for hospital followup. He has history of focal segmental glomerular nephritis per biopsy done at [facility] 2003. He was treated for about 2-1/2 years on prednisone and eventually tapered off. He was lost to nephrology followup. Prior to admission for about 2-3 weeks he had noted darker colored urine and gained about 30 pounds and was having edema in his hands and feet along with some diffuse joint pains and apparently a facial rash. He was admitted on 09/28/2013 with recurrent nephrotic syndrome with acute kidney injury.  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 Patient started on high-dose prednisone currently taking 60 mg once daily. His arthralgias and peripheral edema as well as facial rash have improved. He has seen nephrologist since hospital discharge and had several followup labs there. He has not yet established with rheumatologist.  Patient had mild pancytopenia with anemia, leukopenia, thrombocytopenia. He was started on Amlodipine for hypertension with anticipation of ACE inhibitor or ARB. per nephrology.  He is tolerating amlodipine 10 mg daily with no side effects. Blood pressures been stable by home readings. Has been consistently less than 130/80.  It was recommended patient start Pepcid for prophylaxis on prednisone but he has not yet done this. His hemoglobin was 9 range. He had multiple evaluations including erythropoietin level which was normal, normal haptoglobin, normal reticulocyte count, normal B12 and elevated ferritin related to his acute lupus. He did have mild iron deficiency but low TIBC  Patient had positive ANA and  double-stranded DNA positive. He had reported mild hyponatremia but and followup comprehensive metabolic panel recently with renal.  He brings in [facility] paperwork that he needs completed.  Past Medical History  Diagnosis Date  . HYPERLIPIDEMIA 02/18/2009  . HYPERTENSION 02/18/2009  . Nephritis    Past Surgical History  Procedure Laterality Date  . Renal biopsy  2003  . Tonsillectomy      reports that he has never smoked. He has never used smokeless tobacco. He reports that he drinks alcohol. He reports that he does not use illicit drugs. family history includes Cancer in his maternal grandfather; Diabetes in his maternal grandmother; Hypertension in his maternal grandfather and maternal grandmother. No Known Allergies    Review of Systems  Constitutional: Negative for fatigue.  HENT: Negative for sinus pressure and trouble swallowing.   Eyes: Negative for visual disturbance.  Respiratory: Negative for cough, chest tightness and shortness of breath.   Cardiovascular: Negative for chest pain, palpitations and leg swelling.  Gastrointestinal: Negative for nausea, vomiting and abdominal pain.  Endocrine: Negative for polydipsia and polyuria.  Genitourinary: Negative for dysuria.  Musculoskeletal: Negative for arthralgias.  Skin: Negative for rash.  Neurological: Negative for dizziness, syncope, weakness, light-headedness and headaches.  Psychiatric/Behavioral: Negative for dysphoric mood.       Objective:   Physical Exam  Vitals reviewed. Constitutional: He is oriented to person, place, and time. He appears well-developed and well-nourished.  HENT:  Right Ear: External ear normal.  Left Ear: External ear normal.  Mouth/Throat: Oropharynx is clear and moist.  Eyes:  Patient has very small bilateral subconjunctival hemorrhages.  Neck: Neck supple. No thyromegaly present.  Cardiovascular: Normal rate and regular rhythm.  Exam reveals no gallop.   No murmur  heard. Pulmonary/Chest: Effort normal and breath sounds normal. No respiratory distress. He has no wheezes. He has no rales.  Abdominal: Soft. He exhibits no mass.  Musculoskeletal: He exhibits no edema.  Lymphadenopathy:    He has no cervical adenopathy.  Neurological: He is alert and oriented to person, place, and time.  Skin: No rash noted.  Psychiatric: He has a normal mood and affect. His behavior is normal.          Assessment & Plan:  #1 recurrent nephrotic syndrome with acute kidney injury. 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 Patient followed by renal and prednisone 60 mg daily. Rheumatology referral pending #2 hypertension. Currently stable amlodipine 10 mg daily. Continue close monitoring with goal blood pressure of < 130 / 80 #3 anemia which is normocytic. Likely related to his lupus and anemia of chronic disease. Erythropoietin levels normal. #4 high-dose prednisone therapy. 5 hour postprandial blood sugar today in office 128. Home glucose monitor given. Monitor fasting blood sugars at least once or twice weekly and be in touch if fasting blood sugars climbing over 130

## 2013-10-25 NOTE — Patient Instructions (Signed)
Continue to monitor blood pressure closely at home and be in touch if you're getting readings consistently over 130/80 Let me know if you have not gotten rheumatology appointment set up by next week Monitor blood sugars fasting at least once or twice weekly and be in touch if you're seeing fasting blood sugars consistently over 130 Start Pepcid, as previously recommended by Dr Arrie Aran, for prophylaxis against gastric ulcers while on prednisone Let's plan followup in 3 months and we will plan hemoglobin A1c then to monitor blood sugar

## 2013-10-25 NOTE — Progress Notes (Signed)
Pre visit review using our clinic review tool, if applicable. No additional management support is needed unless otherwise documented below in the visit note. 

## 2013-11-02 ENCOUNTER — Telehealth: Payer: Self-pay | Admitting: Family Medicine

## 2013-11-02 DIAGNOSIS — M329 Systemic lupus erythematosus, unspecified: Secondary | ICD-10-CM

## 2013-11-02 MED ORDER — AMLODIPINE BESYLATE 10 MG PO TABS: 10.0000 mg | ORAL_TABLET | Freq: Every day | ORAL | Status: DC

## 2013-11-02 MED ORDER — PANTOPRAZOLE SODIUM 40 MG PO TBEC: 40.0000 mg | DELAYED_RELEASE_TABLET | Freq: Two times a day (BID) | ORAL | Status: DC

## 2013-11-02 MED ORDER — PREDNISONE 20 MG PO TABS: 60.0000 mg | ORAL_TABLET | Freq: Every day | ORAL | Status: DC

## 2013-11-02 NOTE — Telephone Encounter (Signed)
Sent RX to pharmacy 

## 2013-11-02 NOTE — Telephone Encounter (Signed)
Pt called in requesting a refill of hs amlodipine 10mg , pantoprazole 40mg , and prednisone 20mg . Pt would like this sent to the [facility] on [address]. Please advise.

## 2013-11-02 NOTE — Telephone Encounter (Signed)
Pt advised if not better to call back and request referral for rheumatologist.

## 2013-11-05 ENCOUNTER — Telehealth: Payer: Self-pay

## 2013-11-05 NOTE — Telephone Encounter (Signed)
Yes.  OK to keep out those dates.

## 2013-11-05 NOTE — Telephone Encounter (Signed)
Pt is wanting a letter for work 1-12 and he will return on 11-19-13

## 2013-11-05 NOTE — Telephone Encounter (Signed)
Please clarify "letter for work 1-12"

## 2013-11-05 NOTE — Telephone Encounter (Signed)
He wants to know if we can excuse him from work from Dec. 1,2014 to Nov 16, 2013.

## 2013-11-05 NOTE — Telephone Encounter (Signed)
Pt informed

## 2013-11-05 NOTE — Telephone Encounter (Signed)
Let pt know.  I am referring to [facility] with Dr Dareen Piano or Mallie Mussel.

## 2013-11-06 NOTE — Telephone Encounter (Signed)
Left message for patient. Letter is ready for pick up

## 2013-11-15 ENCOUNTER — Telehealth: Payer: Self-pay

## 2013-11-15 NOTE — Telephone Encounter (Signed)
OK to extend until then since he will not see rheumatologist until then.

## 2013-11-15 NOTE — Telephone Encounter (Signed)
Pt informed

## 2013-11-15 NOTE — Telephone Encounter (Signed)
Pt called stating that he is not ready to go back to work. He is to return back to work on 11/19/2013. Pt wants to know if we can write him another letter to be out 11/19/13 to 12/13/2013. Pt has to appt on 12/10/13 and 12/12/13.

## 2013-11-21 ENCOUNTER — Telehealth: Payer: Self-pay | Admitting: Family Medicine

## 2013-11-21 NOTE — Telephone Encounter (Signed)
Pt stated that his pharmacy will not fill his pantoprazole (PROTONIX) 40 MG tablet. They're requesting "some type of doctor over - ride"... He doesn't know exactly what they need but they will not fill his RX prior to hearing from [place]. He's been out for 2 days now. Please assist.

## 2013-11-21 NOTE — Telephone Encounter (Signed)
Called in RX for patient and pt is aware

## 2014-01-28 ENCOUNTER — Ambulatory Visit (INDEPENDENT_AMBULATORY_CARE_PROVIDER_SITE_OTHER): Payer: Federal, State, Local not specified - PPO | Admitting: Family Medicine

## 2014-01-28 ENCOUNTER — Encounter: Payer: Self-pay | Admitting: Family Medicine

## 2014-01-28 VITALS — BP 130/68 | HR 97 | Temp 97.8°F | Wt 250.0 lb

## 2014-01-28 DIAGNOSIS — I10 Essential (primary) hypertension: Secondary | ICD-10-CM

## 2014-01-28 DIAGNOSIS — Z7952 Long term (current) use of systemic steroids: Secondary | ICD-10-CM | POA: Insufficient documentation

## 2014-01-28 DIAGNOSIS — IMO0002 Reserved for concepts with insufficient information to code with codable children: Secondary | ICD-10-CM

## 2014-01-28 DIAGNOSIS — M329 Systemic lupus erythematosus, unspecified: Secondary | ICD-10-CM

## 2014-01-28 NOTE — Patient Instructions (Signed)
Continue to monitor blood sugars and be in touch if glucose > 130. Try to establish more regular exercise. Watch sugar and white starch intake.

## 2014-01-28 NOTE — Progress Notes (Signed)
Subjective:    Patient ID: Austin [name], male    DOB: [birth year], 38 y.o.   MRN: [id number]  HPI Patient seen for medical followup. 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 He was treated for about 2-1/2 years on prednisone and eventually tapered off. He was lost to nephrology followup. Prior to admission for about 2-3 weeks he had noted darker colored urine and gained about 30 pounds and was having edema in his hands and feet along with some diffuse joint pains and apparently a facial rash. He was admitted on 09/28/2013 with recurrent nephrotic syndrome with acute kidney injury.  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 Patient started on high-dose prednisone currently taking 60 mg once daily. His arthralgias and peripheral edema as well as facial rash have improved. He has seen nephrologist since hospital discharge and had several followup labs there. He has not yet established with rheumatologist.  Patient had mild pancytopenia with anemia, leukopenia, thrombocytopenia. He was started on Amlodipine for hypertension with anticipation of ACE inhibitor or ARB. per nephrology. He is tolerating amlodipine 10 mg daily with no side effects. Blood pressures been stable by home readings. Has been consistently less than 130/80.  It was recommended patient start Pepcid for prophylaxis on prednisone but he has not yet done this.  His hemoglobin was 9 range. He had multiple evaluations including erythropoietin level which was normal, normal haptoglobin, normal reticulocyte count, normal B12 and elevated ferritin related to his acute lupus. He did have mild iron deficiency but low TIBC  Patient  had positive ANA and double-stranded DNA positive. He had reported mild hyponatremia but and followup comprehensive metabolic panel recently with renal. "  At this time, he is followed regularly by nephrology and rheumatology. Rheumatology was recently added placoid male 200 mg 2 tablets daily. He remains on prednisone 40 mg daily. He feels somewhat jittery and anxious when taking the prednisone in hopes to taper this soon. He had some initial insomnia which is improved. His CBGs have been consistently around 70-80 fasting. We have given him home glucose monitor last visit. He is gained substantial weight since last visit which he attributes to prednisone. By our scales he has gained about 33 pounds over the past few months. He is back to work full time. No joint pains. Renal function reportedly normal. Blood pressures been stable on amlodipine. No headaches or dizziness.  Past Medical History  Diagnosis Date  . HYPERLIPIDEMIA 02/18/2009  . HYPERTENSION 02/18/2009  . Nephritis    Past Surgical History  Procedure Laterality Date  . Renal biopsy  2003  . Tonsillectomy      reports that he has never smoked. He has never used smokeless tobacco. He reports that he drinks alcohol. He reports that he does not use illicit drugs. family history includes Cancer in his maternal grandfather; Diabetes in his maternal grandmother; Hypertension in his maternal grandfather and maternal grandmother. No Known Allergies      Review of Systems  Constitutional: Negative for fever and chills.  Eyes: Negative for visual disturbance.  Respiratory: Negative for shortness of breath.   Cardiovascular: Negative for chest pain.  Gastrointestinal: Negative for abdominal pain.  Endocrine: Negative for polydipsia and polyuria.  Musculoskeletal: Negative for arthralgias.  Neurological: Negative for  dizziness and headaches.       Objective:   Physical Exam  Constitutional: He is oriented to person, place, and time.  He appears well-developed and well-nourished.  HENT:  Mouth/Throat: Oropharynx is clear and moist.  Neck: Neck supple. No thyromegaly present.  Cardiovascular: Normal rate and regular rhythm.   Pulmonary/Chest: Effort normal and breath sounds normal. No respiratory distress. He has no wheezes. He has no rales.  Musculoskeletal: He exhibits no edema.  Neurological: He is alert and oriented to person, place, and time.          Assessment & Plan:  #1 history of lupus nephritis followed by rheumatology and nephrology and currently on plaquenil  and fairly high-dose prednisone #2 hypertension which is stable on amlodipine #3 high dose prednisone. At risk for hyperglycemia. Current CBGs stable. Continue close glucose monitoring. We have expressed our concerns regarding his rapid weight gain and make efforts not to continue

## 2014-01-28 NOTE — Progress Notes (Signed)
Pre visit review using our clinic review tool, if applicable. No additional management support is needed unless otherwise documented below in the visit note. 

## 2014-01-29 ENCOUNTER — Telehealth: Payer: Self-pay | Admitting: Family Medicine

## 2014-01-29 NOTE — Telephone Encounter (Signed)
Relevant patient education assigned to patient using Emmi. ° °

## 2014-06-21 ENCOUNTER — Encounter: Payer: Self-pay | Admitting: Family Medicine

## 2015-11-09 ENCOUNTER — Ambulatory Visit (INDEPENDENT_AMBULATORY_CARE_PROVIDER_SITE_OTHER): Payer: Federal, State, Local not specified - PPO | Admitting: Emergency Medicine

## 2015-11-09 VITALS — BP 112/70 | HR 97 | Temp 98.4°F | Resp 17 | Wt 234.8 lb

## 2015-11-09 DIAGNOSIS — H109 Unspecified conjunctivitis: Secondary | ICD-10-CM

## 2015-11-09 MED ORDER — POLYMYXIN B-TRIMETHOPRIM 10000-0.1 UNIT/ML-% OP SOLN: 2.0000 [drp] | Freq: Four times a day (QID) | OPHTHALMIC | Status: DC

## 2015-11-09 NOTE — Progress Notes (Signed)
Subjective:  Patient ID: Austin [name], male    DOB: 1976-08-19  Age: 39 y.o. MRN: [id number]  CC: Conjunctivitis   HPI Austin [name] presents  with a 2 day history of red eye. He said he has no pain. No light sensitivity he has no foreign body sensation. He has 2 sick children are a sick mother-in-law at home. He has no nasal congestion postnasal drainage sore throat fever chills or other complaints  History Austin [name] has a past medical history of HYPERLIPIDEMIA (02/18/2009); HYPERTENSION (02/18/2009); Nephritis; Lupus glomerulonephritis (HCC); and Pancytopenia (HCC).   He has past surgical history that includes Renal biopsy (2003) and Tonsillectomy.   His  family history includes Cancer in his maternal grandfather; Diabetes in his maternal grandmother; Hyperlipidemia in his father; Hypertension in his maternal grandfather and maternal grandmother.  He   reports that he has never smoked. He has never used smokeless tobacco. He reports that he drinks alcohol. He reports that he does not use illicit drugs.  Outpatient Prescriptions Prior to Visit  Medication Sig Dispense Refill  . hydroxychloroquine (PLAQUENIL) 200 MG tablet Take 2 tablets by mouth daily with food or milk.    Marland Kitchen amLODipine (NORVASC) 10 MG tablet Take 1 tablet (10 mg total) by mouth daily. (Patient not taking: Reported on 11/09/2015) 30 tablet 11  . pantoprazole (PROTONIX) 40 MG tablet Take 1 tablet (40 mg total) by mouth 2 (two) times daily. (Patient not taking: Reported on 11/09/2015) 60 tablet 5  . predniSONE (DELTASONE) 20 MG tablet Take 20 mg by mouth daily with breakfast. Take 2 tablets daily     No facility-administered medications prior to visit.    Social History   Social History  . Marital Status: Divorced    Spouse Name: N/A  . Number of Children: N/A  . Years of Education: N/A   Social History Main Topics  . Smoking status: Never Smoker   . Smokeless tobacco: Never Used  . Alcohol Use: Yes   Comment: rare  . Drug Use: No  . Sexual Activity: Not Asked   Other Topics Concern  . None   Social History Narrative     Review of Systems  Constitutional: Negative for fever, chills and appetite change.  HENT: Negative for congestion, ear pain, postnasal drip, sinus pressure and sore throat.   Eyes: Positive for discharge and redness. Negative for photophobia, pain and visual disturbance.  Respiratory: Negative for cough, shortness of breath and wheezing.   Cardiovascular: Negative for leg swelling.  Gastrointestinal: Negative for nausea, vomiting, abdominal pain, diarrhea, constipation and blood in stool.  Endocrine: Negative for polyuria.  Genitourinary: Negative for dysuria, urgency, frequency and flank pain.  Musculoskeletal: Negative for gait problem.  Skin: Negative for rash.  Neurological: Negative for weakness and headaches.  Psychiatric/Behavioral: Negative for confusion and decreased concentration. The patient is not nervous/anxious.     Objective:  BP 112/70 mmHg  Pulse 97  Temp(Src) 98.4 F (36.9 C) (Oral)  Resp 17  Wt 234 lb 12.8 oz (106.505 kg)  SpO2 97%  Physical Exam  Constitutional: He is oriented to person, place, and time. He appears well-developed and well-nourished. No distress.  HENT:  Head: Normocephalic and atraumatic.  Right Ear: External ear normal.  Left Ear: External ear normal.  Nose: Nose normal.  Eyes: EOM are normal. Pupils are equal, round, and reactive to light. Left eye exhibits discharge. No foreign body present in the left eye. Left conjunctiva is injected. Left conjunctiva has no  hemorrhage. No scleral icterus. Left eye exhibits normal extraocular motion. Left pupil is round and reactive.  Neck: Normal range of motion. Neck supple. No tracheal deviation present.  Cardiovascular: Normal rate, regular rhythm and normal heart sounds.   Pulmonary/Chest: Effort normal. No respiratory distress. He has no wheezes. He has no rales.    Abdominal: He exhibits no mass. There is no tenderness. There is no rebound and no guarding.  Musculoskeletal: He exhibits no edema.  Lymphadenopathy:    He has no cervical adenopathy.  Neurological: He is alert and oriented to person, place, and time. Coordination normal.  Skin: Skin is warm and dry. No rash noted.  Psychiatric: He has a normal mood and affect. His behavior is normal. Thought content normal.      Assessment & Plan:   Onalee HuaDavid was seen today for conjunctivitis.  Diagnoses and all orders for this visit:  Conjunctivitis of left eye  Other orders -     trimethoprim-polymyxin b (POLYTRIM) ophthalmic solution; Place 2 drops into the left eye every 6 (six) hours.   I am having Mr. [name] start on trimethoprim-polymyxin b. I am also having him maintain his amLODipine, pantoprazole, hydroxychloroquine, predniSONE, and losartan.  Meds ordered this encounter  Medications  . losartan (COZAAR) 25 MG tablet    Sig: Take 25 mg by mouth daily.  Marland Kitchen. trimethoprim-polymyxin b (POLYTRIM) ophthalmic solution    Sig: Place 2 drops into the left eye every 6 (six) hours.    Dispense:  10 mL    Refill:  0    Appropriate red flag conditions were discussed with the patient as well as actions that should be taken.  Patient expressed his understanding.  Follow-up: Return if symptoms worsen or fail to improve.  Carmelina DaneAnderson, Jeffery S, MD

## 2015-11-09 NOTE — Patient Instructions (Signed)

## 2016-06-11 ENCOUNTER — Encounter (HOSPITAL_COMMUNITY): Payer: Self-pay | Admitting: Emergency Medicine

## 2016-06-11 ENCOUNTER — Emergency Department (HOSPITAL_COMMUNITY)
Admission: EM | Admit: 2016-06-11 | Discharge: 2016-06-11 | Disposition: A | Discharge status: Home / Self Care | Payer: Federal, State, Local not specified - PPO | Attending: Emergency Medicine | Admitting: Emergency Medicine

## 2016-06-11 DIAGNOSIS — Y999 Unspecified external cause status: Secondary | ICD-10-CM | POA: Diagnosis not present

## 2016-06-11 DIAGNOSIS — S30861A Insect bite (nonvenomous) of abdominal wall, initial encounter: Secondary | ICD-10-CM | POA: Diagnosis present

## 2016-06-11 DIAGNOSIS — Y939 Activity, unspecified: Secondary | ICD-10-CM | POA: Insufficient documentation

## 2016-06-11 DIAGNOSIS — Y929 Unspecified place or not applicable: Secondary | ICD-10-CM | POA: Insufficient documentation

## 2016-06-11 DIAGNOSIS — I10 Essential (primary) hypertension: Secondary | ICD-10-CM | POA: Insufficient documentation

## 2016-06-11 DIAGNOSIS — W57XXXA Bitten or stung by nonvenomous insect and other nonvenomous arthropods, initial encounter: Secondary | ICD-10-CM | POA: Diagnosis not present

## 2016-06-11 DIAGNOSIS — Z79899 Other long term (current) drug therapy: Secondary | ICD-10-CM | POA: Diagnosis not present

## 2016-06-11 NOTE — ED Notes (Signed)
Found tick on left flank this morning.  Pt pulled tick off prior to arrival.  Small bite mark noted.

## 2016-06-11 NOTE — ED Provider Notes (Signed)
CSN: [id number]     Arrival date & time 06/11/16  0551 History   First MD Initiated Contact with Patient 06/11/16 0602     Chief Complaint  Patient presents with  . Insect Bite     (Consider location/radiation/quality/duration/timing/severity/associated sxs/prior Treatment) HPI Austin [name] is a 40 y.o. male with hx of Lupus and nephritis, presents to ED with complaint of a tick bite. Patient states he found and pulled a tick off of his left lower abdomen this morning. He is unsure how long the tick has been there. He denies any rash to the area. He denies any fever, chills, headache. He denies any rash anywhere else on the body. He states he has never had a tick bite before and wasn't sure of what to do. He is worried because he has   Past Medical History  Diagnosis Date  . HYPERLIPIDEMIA 02/18/2009  . HYPERTENSION 02/18/2009  . Nephritis   . Lupus glomerulonephritis (HCC)   . Pancytopenia [facility](HCC)    Past Surgical History  Procedure Laterality Date  . Renal biopsy  2003  . Tonsillectomy     Family History  Problem Relation Age of Onset  . Hypertension Maternal Grandmother   . Diabetes Maternal Grandmother   . Hypertension Maternal Grandfather   . Cancer Maternal Grandfather     prostate  . Hyperlipidemia Father    Social History  Substance Use Topics  . Smoking status: Never Smoker   . Smokeless tobacco: Never Used  . Alcohol Use: Yes     Comment: rare    Review of Systems  Constitutional: Negative for fever and chills.  Respiratory: Negative for cough, chest tightness and shortness of breath.   Cardiovascular: Negative for chest pain, palpitations and leg swelling.  Gastrointestinal: Negative for nausea, vomiting, abdominal pain, diarrhea and abdominal distention.  Musculoskeletal: Negative for myalgias, arthralgias, neck pain and neck stiffness.  Skin: Positive for wound. Negative for rash.  Neurological: Negative for dizziness, weakness, light-headedness,  numbness and headaches.  All other systems reviewed and are negative.     Allergies  Review of patient's allergies indicates no known allergies.  Home Medications   Prior to Admission medications   Medication Sig Start Date End Date Taking? Authorizing Provider  amLODipine (NORVASC) 10 MG tablet Take 1 tablet (10 mg total) by mouth daily. Patient not taking: Reported on 11/09/2015 11/02/13   Kristian CoveyBruce W Burchette, MD  hydroxychloroquine (PLAQUENIL) 200 MG tablet Take 2 tablets by mouth daily with food or milk. 01/10/14   Historical Provider, MD  losartan (COZAAR) 25 MG tablet Take 25 mg by mouth daily.    Historical Provider, MD  pantoprazole (PROTONIX) 40 MG tablet Take 1 tablet (40 mg total) by mouth 2 (two) times daily. Patient not taking: Reported on 11/09/2015 11/02/13   Kristian CoveyBruce W Burchette, MD  predniSONE (DELTASONE) 20 MG tablet Take 20 mg by mouth daily with breakfast. Take 2 tablets daily 11/02/13   Kristian CoveyBruce W Burchette, MD  trimethoprim-polymyxin b (POLYTRIM) ophthalmic solution Place 2 drops into the left eye every 6 (six) hours. 11/09/15   Carmelina DaneJeffery S Anderson, MD   BP 127/83 mmHg  Pulse 68  Temp(Src) 98.5 F (36.9 C) (Oral)  Resp 18  Ht 6\' 5"  (1.956 m)  Wt 106.595 kg  BMI 27.86 kg/m2  SpO2 98% Physical Exam  Constitutional: He appears well-developed and well-nourished. No distress.  HENT:  Head: Normocephalic.  Neck: Neck supple.  Cardiovascular: Normal rate, regular rhythm and normal heart sounds.  Pulmonary/Chest: Effort normal and breath sounds normal. No respiratory distress. He has no wheezes. He has no rales.  Skin:  Pinpoint papule to the left lower abdominal wall. No surrounding erythema or swelling. No foreign body in the skin.  Nursing note and vitals reviewed.   ED Course  Procedures (including critical care time) Labs Review Labs Reviewed - No data to display  Imaging Review No results found. I have personally reviewed and evaluated these images and lab  results as part of my medical decision-making.   EKG Interpretation None      MDM   Final diagnoses:  Tick bite   Patient in emergency department with a tick bite that he pulled off just prior to arrival. Site of the bite appears to be normal with no obvious signs of infection, no swelling, no rash around it. Patient has no other complaints. Specifically denies any headache, fever, or rash to extremities, fatigue. He does have history of lupus, but no related complaints at this time. We'll discharge home with wound care. Follow-up as needed. Discussed symptoms that should prompt his follow-up with his doctor or return to emergency department.  Filed Vitals:   06/11/16 0557 06/11/16 0600 06/11/16 0601 06/11/16 0603  BP: 126/80 127/83    Pulse: 70 68    Temp: 98.5 F (36.9 C)     TempSrc: Oral     Resp: 18     Height: 6\' 5"  (1.956 m)     Weight: 106.595 kg     SpO2: 97% 97% 98% 98%      Jaynie Crumbleatyana Lawrance Wiedemann, PA-C 06/11/16 0723  Linwood DibblesJon Knapp, MD 06/11/16 [phone]

## 2016-06-11 NOTE — Discharge Instructions (Signed)
Bacitracin to the bite area twice a day. Watch for signs of infection around the bite area. If develop severe headache, fever, rash, return to emergency department or go to you doctor.    Tick Bite Information Ticks are insects that attach themselves to the skin and draw blood for food. There are various types of ticks. Common types include wood ticks and deer ticks. Most ticks live in shrubs and grassy areas. Ticks can climb onto your body when you make contact with leaves or grass where the tick is waiting. The most common places on the body for ticks to attach themselves are the scalp, neck, armpits, waist, and groin. Most tick bites are harmless, but sometimes ticks carry germs that cause diseases. These germs can be spread to a person during the tick's feeding process. The chance of a disease spreading through a tick bite depends on:   The type of tick.  Time of year.   How long the tick is attached.   Geographic location.  HOW CAN YOU PREVENT TICK BITES? Take these steps to help prevent tick bites when you are outdoors:  Wear protective clothing. Long sleeves and long pants are best.   Wear white clothes so you can see ticks more easily.  Tuck your pant legs into your socks.   If walking on a trail, stay in the middle of the trail to avoid brushing against bushes.  Avoid walking through areas with long grass.  Put insect repellent on all exposed skin and along boot tops, pant legs, and sleeve cuffs.   Check clothing, hair, and skin repeatedly and before going inside.   Brush off any ticks that are not attached.  Take a shower or bath as soon as possible after being outdoors.  WHAT IS THE PROPER WAY TO REMOVE A TICK? Ticks should be removed as soon as possible to help prevent diseases caused by tick bites. 1. If latex gloves are available, put them on before trying to remove a tick.  2. Using fine-point tweezers, grasp the tick as close to the skin as possible.  You may also use curved forceps or a tick removal tool. Grasp the tick as close to its head as possible. Avoid grasping the tick on its body. 3. Pull gently with steady upward pressure until the tick lets go. Do not twist the tick or jerk it suddenly. This may break off the tick's head or mouth parts. 4. Do not squeeze or crush the tick's body. This could force disease-carrying fluids from the tick into your body.  5. After the tick is removed, wash the bite area and your hands with soap and water or other disinfectant such as alcohol. 6. Apply a small amount of antiseptic cream or ointment to the bite site.  7. Wash and disinfect any instruments that were used.  Do not try to remove a tick by applying a hot match, petroleum jelly, or fingernail polish to the tick. These methods do not work and may increase the chances of disease being spread from the tick bite.  WHEN SHOULD YOU SEEK MEDICAL CARE? Contact your health care provider if you are unable to remove a tick from your skin or if a part of the tick breaks off and is stuck in the skin.  After a tick bite, you need to be aware of signs and symptoms that could be related to diseases spread by ticks. Contact your health care provider if you develop any of the following in the  days or weeks after the tick bite:  Unexplained fever.  Rash. A circular rash that appears days or weeks after the tick bite may indicate the possibility of Lyme disease. The rash may resemble a target with a bull's-eye and may occur at a different part of your body than the tick bite.  Redness and swelling in the area of the tick bite.   Tender, swollen lymph glands.   Diarrhea.   Weight loss.   Cough.   Fatigue.   Muscle, joint, or bone pain.   Abdominal pain.   Headache.   Lethargy or a change in your level of consciousness.  Difficulty walking or moving your legs.   Numbness in the legs.   Paralysis.  Shortness of breath.    Confusion.   Repeated vomiting.    This information is not intended to replace advice given to you by your health care provider. Make sure you discuss any questions you have with your health care provider.   Document Released: 11/19/2000 Document Revised: 12/13/2014 Document Reviewed: 05/02/2013 Elsevier Interactive Patient Education [employer]2016 [employer]

## 2016-08-30 ENCOUNTER — Other Ambulatory Visit (HOSPITAL_COMMUNITY): Payer: Self-pay | Admitting: Nephrology

## 2016-08-30 DIAGNOSIS — M3214 Glomerular disease in systemic lupus erythematosus: Secondary | ICD-10-CM

## 2016-09-01 ENCOUNTER — Other Ambulatory Visit: Payer: Self-pay | Admitting: Radiology

## 2016-09-02 ENCOUNTER — Other Ambulatory Visit: Payer: Self-pay | Admitting: General Surgery

## 2016-09-03 ENCOUNTER — Encounter (HOSPITAL_COMMUNITY): Payer: Self-pay

## 2016-09-03 ENCOUNTER — Ambulatory Visit (HOSPITAL_COMMUNITY)
Admission: RE | Admit: 2016-09-03 | Discharge: 2016-09-03 | Disposition: A | Discharge status: Home / Self Care | Payer: Federal, State, Local not specified - PPO | Source: Ambulatory Visit | Attending: Nephrology | Admitting: Nephrology

## 2016-09-03 DIAGNOSIS — Z79899 Other long term (current) drug therapy: Secondary | ICD-10-CM | POA: Insufficient documentation

## 2016-09-03 DIAGNOSIS — R809 Proteinuria, unspecified: Secondary | ICD-10-CM | POA: Insufficient documentation

## 2016-09-03 DIAGNOSIS — N269 Renal sclerosis, unspecified: Secondary | ICD-10-CM | POA: Diagnosis not present

## 2016-09-03 DIAGNOSIS — M3214 Glomerular disease in systemic lupus erythematosus: Secondary | ICD-10-CM | POA: Insufficient documentation

## 2016-09-03 LAB — CBC
HCT: 37.7 % — ABNORMAL LOW (ref 39.0–52.0)
HEMOGLOBIN: 12.2 g/dL — AB (ref 13.0–17.0)
MCH: 28.1 pg (ref 26.0–34.0)
MCHC: 32.4 g/dL (ref 30.0–36.0)
MCV: 86.9 fL (ref 78.0–100.0)
Platelets: 204 10*3/uL (ref 150–400)
RBC: 4.34 MIL/uL (ref 4.22–5.81)
RDW: 12.7 % (ref 11.5–15.5)
WBC: 6.4 10*3/uL (ref 4.0–10.5)

## 2016-09-03 LAB — APTT: APTT: 27 s (ref 24–36)

## 2016-09-03 LAB — PROTIME-INR
INR: 0.99
PROTHROMBIN TIME: 13.1 s (ref 11.4–15.2)

## 2016-09-03 MED ORDER — SODIUM CHLORIDE 0.9 % IV SOLN: INTRAVENOUS | Status: DC

## 2016-09-03 MED ORDER — SODIUM CHLORIDE 0.9 % IV SOLN
INTRAVENOUS | Status: AC | PRN
  Administered 2016-09-03: 30 mL/h via INTRAVENOUS

## 2016-09-03 MED ORDER — LIDOCAINE HCL 1 % IJ SOLN
INTRAMUSCULAR | Status: AC
Start: 2016-09-03 — End: 2016-09-03
  Filled 2016-09-03: qty 20

## 2016-09-03 MED ORDER — MIDAZOLAM HCL 2 MG/2ML IJ SOLN
INTRAMUSCULAR | Status: AC
  Filled 2016-09-03: qty 4

## 2016-09-03 MED ORDER — FENTANYL CITRATE (PF) 100 MCG/2ML IJ SOLN
INTRAMUSCULAR | Status: AC | PRN
  Administered 2016-09-03 (×2): 50 ug via INTRAVENOUS

## 2016-09-03 MED ORDER — FENTANYL CITRATE (PF) 100 MCG/2ML IJ SOLN
INTRAMUSCULAR | Status: AC
  Filled 2016-09-03: qty 4

## 2016-09-03 MED ORDER — GELATIN ABSORBABLE 12-7 MM EX MISC
CUTANEOUS | Status: AC
  Filled 2016-09-03: qty 1

## 2016-09-03 MED ORDER — HYDROCODONE-ACETAMINOPHEN 5-325 MG PO TABS: 1.0000 | ORAL_TABLET | ORAL | Status: DC | PRN

## 2016-09-03 MED ORDER — MIDAZOLAM HCL 2 MG/2ML IJ SOLN
INTRAMUSCULAR | Status: AC | PRN
Start: 2016-09-03 — End: 2016-09-03
  Administered 2016-09-03: 1 mg via INTRAVENOUS
  Administered 2016-09-03: 2 mg via INTRAVENOUS

## 2016-09-03 NOTE — Sedation Documentation (Signed)
Patient is resting comfortably. 

## 2016-09-03 NOTE — H&P (Signed)
Chief Complaint: Patient was seen in consultation today for random renal biopsy at the request of Coladonato,Joseph  Referring Physician(s): Coladonato,Joseph  Supervising Physician: Malachy Moan  Patient Status: Outpatient  History of Present Illness: Austin [name] is a 40 y.o. male   Known focal segmental glomerulosclerosis Previous renal bx 09/2013 Scheduled now for random renal biopsy---recurrent "foamy urine" Proteinuria Microscopic hematuria Lupus Nephrotic syndrome    Past Medical History:  Diagnosis Date  . HYPERLIPIDEMIA 02/18/2009  . HYPERTENSION 02/18/2009  . Lupus glomerulonephritis (HCC)   . Nephritis   . Pancytopenia [facility])     Past Surgical History:  Procedure Laterality Date  . RENAL BIOPSY  2003  . TONSILLECTOMY      Allergies: Review of patient's allergies indicates no known allergies.  Medications: Prior to Admission medications   Medication Sig Start Date End Date Taking? Authorizing Provider  acetaminophen (TYLENOL) 500 MG tablet Take 1,000 mg by mouth every 6 (six) hours as needed for moderate pain or headache.   Yes Historical Provider, MD  hydroxychloroquine (PLAQUENIL) 200 MG tablet Take 2 tablets by mouth daily with food or milk. 01/10/14  Yes Historical Provider, MD  losartan (COZAAR) 25 MG tablet Take 25 mg by mouth daily.   Yes Historical Provider, MD  predniSONE (DELTASONE) 20 MG tablet Take 60 mg by mouth daily with breakfast.  11/02/13  Yes Kristian Covey, MD  amLODipine (NORVASC) 10 MG tablet Take 1 tablet (10 mg total) by mouth daily. Patient not taking: Reported on 11/09/2015 11/02/13   Kristian Covey, MD  pantoprazole (PROTONIX) 40 MG tablet Take 1 tablet (40 mg total) by mouth 2 (two) times daily. Patient not taking: Reported on 09/01/2016 11/02/13   Kristian Covey, MD  trimethoprim-polymyxin b (POLYTRIM) ophthalmic solution Place 2 drops into the left eye every 6 (six) hours. Patient not taking: Reported on  09/01/2016 11/09/15   Carmelina Dane, MD     Family History  Problem Relation Age of Onset  . Hypertension Maternal Grandmother   . Diabetes Maternal Grandmother   . Hypertension Maternal Grandfather   . Cancer Maternal Grandfather     prostate  . Hyperlipidemia Father     Social History   Social History  . Marital status: Married    Spouse name: N/A  . Number of children: N/A  . Years of education: N/A   Social History Main Topics  . Smoking status: Never Smoker  . Smokeless tobacco: Never Used  . Alcohol use Yes     Comment: rare  . Drug use: No  . Sexual activity: Not Asked   Other Topics Concern  . None   Social History Narrative  . None    Review of Systems: A 12 point ROS discussed and pertinent positives are indicated in the HPI above.  All other systems are negative.  Review of Systems  Constitutional: Negative for activity change, appetite change, fatigue and fever.  Respiratory: Negative for shortness of breath.   Cardiovascular: Negative for chest pain.  Gastrointestinal: Negative for nausea.  Neurological: Negative for weakness.  Psychiatric/Behavioral: Negative for behavioral problems and confusion.    Vital Signs: BP 125/84   Pulse 70   Temp 98.9 F (37.2 C)   Resp 18   Ht 6\' 5"  (1.956 m)   Wt 237 lb (107.5 kg)   SpO2 99%   BMI 28.10 kg/m   Physical Exam  Constitutional: He is oriented to person, place, and time. He appears well-nourished.  Cardiovascular: Normal rate, regular rhythm and normal heart sounds.   Pulmonary/Chest: Effort normal and breath sounds normal. He has no wheezes.  Abdominal: Soft. Bowel sounds are normal. There is no tenderness.  Musculoskeletal: Normal range of motion. He exhibits no edema.  Neurological: He is alert and oriented to person, place, and time.  Skin: Skin is warm and dry.  Psychiatric: He has a normal mood and affect. His behavior is normal. Judgment and thought content normal.  Nursing note and  vitals reviewed.   Mallampati Score:  MD Evaluation Airway: WNL Heart: WNL Abdomen: WNL Chest/ Lungs: WNL ASA  Classification: 2 Mallampati/Airway Score: One  Imaging: No results found.  Labs:  CBC:  Recent Labs  09/03/16 0618  WBC 6.4  HGB 12.2*  HCT 37.7*  PLT 204    COAGS:  Recent Labs  09/03/16 0618  INR 0.99  APTT 27    BMP: No results for input(s): NA, K, CL, CO2, GLUCOSE, BUN, CALCIUM, CREATININE, GFRNONAA, GFRAA in the last 8760 hours.  Invalid input(s): CMP  LIVER FUNCTION TESTS: No results for input(s): BILITOT, AST, ALT, ALKPHOS, PROT, ALBUMIN in the last 8760 hours.  TUMOR MARKERS: No results for input(s): AFPTM, CEA, CA199, CHROMGRNA in the last 8760 hours.  Assessment and Plan:  Lupus nephrotic syndrome For random renal biopsy per Dr Arrie Aranoladonato Risks and Benefits discussed with the patient including, but not limited to bleeding, infection, damage to adjacent structures or low yield requiring additional tests. All of the patient's questions were answered, patient is agreeable to proceed. Consent signed and in chart.    Thank you for this interesting consult.  I greatly enjoyed meeting Austin [name] and look forward to participating in their care.  A copy of this report was sent to the requesting provider on this date.  Electronically Signed: Ralene MuskratURPIN,Tahiri Shareef A 09/03/2016, 7:15 AM   I spent a total of  30 Minutes   in face to face in clinical consultation, greater than 50% of which was counseling/coordinating care for random renal biopsy

## 2016-09-03 NOTE — Sedation Documentation (Signed)
O2 d/c'd 

## 2016-09-03 NOTE — Procedures (Signed)
Interventional Radiology Procedure Note  Procedure: LEFT renal biopsy with US guidance  Complications: None  Estimated Blood Loss: None  Recommendations: - Path pending - bedrest x 4 hrs  Signed,  Sterling BigHeath K. Jesusa Stenerson, MD

## 2016-09-03 NOTE — Discharge Instructions (Signed)
Kidney Biopsy, Care After °Refer to this sheet in the next few weeks. These instructions provide you with information on caring for yourself after your procedure. Your health care provider may also give you more specific instructions. Your treatment has been planned according to current medical practices, but problems sometimes occur. Call your health care provider if you have any problems or questions after your procedure.  °WHAT TO EXPECT AFTER THE PROCEDURE  °· You may notice blood in the urine for the first 24 hours after the biopsy. °· You may feel some pain at the biopsy site for 1-2 weeks after the biopsy. °HOME CARE INSTRUCTIONS °· Do not lift anything heavier than 10 lb (4.5 kg) for 2 weeks. °· Do not take any non-steroidal anti-inflammatory drugs (NSAIDs) or any blood thinners for a week after the biopsy unless instructed to do so by your health care provider. °· Only take medicines for pain, fever, or discomfort as directed by your health care provider. °SEEK MEDICAL CARE IF: °· You have bloody urine more than 24 hours after the biopsy.   °· You develop a fever.   °· You cannot urinate.   °· You have increasing pain at the biopsy site.   °SEEK IMMEDIATE MEDICAL CARE IF: °You feel faint or dizzy.  °  °This information is not intended to replace advice given to you by your health care provider. Make sure you discuss any questions you have with your health care provider. °  °Document Released: 07/25/2013 Document Reviewed: 07/25/2013 °Elsevier Interactive Patient Education ©2016 Elsevier Inc. ° °

## 2016-09-03 NOTE — Sedation Documentation (Signed)
O2 applied 2l

## 2016-09-13 ENCOUNTER — Encounter (HOSPITAL_COMMUNITY): Payer: Self-pay

## 2016-09-29 ENCOUNTER — Ambulatory Visit (INDEPENDENT_AMBULATORY_CARE_PROVIDER_SITE_OTHER): Payer: Federal, State, Local not specified - PPO | Admitting: Adult Health

## 2016-09-29 ENCOUNTER — Encounter: Payer: Self-pay | Admitting: Adult Health

## 2016-09-29 VITALS — BP 120/60 | Temp 98.4°F | Ht 77.0 in | Wt 234.4 lb

## 2016-09-29 DIAGNOSIS — K529 Noninfective gastroenteritis and colitis, unspecified: Secondary | ICD-10-CM

## 2016-09-29 MED ORDER — ONDANSETRON HCL 4 MG PO TABS: 4.0000 mg | ORAL_TABLET | Freq: Three times a day (TID) | ORAL | 0 refills | Status: DC | PRN

## 2016-09-29 NOTE — Progress Notes (Signed)
Subjective:    Patient ID: Austin [name], male    DOB: 10/20/1976, 40 y.o.   MRN: [id number]  HPI  40 year old male who presents with 5 days of fever ( 101-102), headaches, nausea, vomiting ( today), and diarrhea. He reports that the worst day was 2 days ago and he had started to feel better for a day , but then last night his symptoms returned.   He has been staying hydrated but has a decreased appetite.   He has been using Tylenol cold and flu which help with his fever.   Review of Systems  Constitutional: Negative.   Gastrointestinal: Positive for abdominal pain, diarrhea, nausea and vomiting. Negative for anal bleeding and blood in stool.  Musculoskeletal: Negative.   Skin: Negative.   Neurological: Negative.   All other systems reviewed and are negative.  Past Medical History:  Diagnosis Date  . HYPERLIPIDEMIA 02/18/2009  . HYPERTENSION 02/18/2009  . Lupus glomerulonephritis (HCC)   . Nephritis   . Pancytopenia [facility](HCC)     Social History   Social History  . Marital status: Married    Spouse name: N/A  . Number of children: N/A  . Years of education: N/A   Occupational History  . Not on file.   Social History Main Topics  . Smoking status: Never Smoker  . Smokeless tobacco: Never Used  . Alcohol use Yes     Comment: rare  . Drug use: No  . Sexual activity: Not on file   Other Topics Concern  . Not on file   Social History Narrative  . No narrative on file    Past Surgical History:  Procedure Laterality Date  . RENAL BIOPSY  2003  . TONSILLECTOMY      Family History  Problem Relation Age of Onset  . Hypertension Maternal Grandmother   . Diabetes Maternal Grandmother   . Hypertension Maternal Grandfather   . Cancer Maternal Grandfather     prostate  . Hyperlipidemia Father     No Known Allergies  Current Outpatient Prescriptions on File Prior to Visit  Medication Sig Dispense Refill  . acetaminophen (TYLENOL) 500 MG tablet Take 1,000 mg  by mouth every 6 (six) hours as needed for moderate pain or headache.    . hydroxychloroquine (PLAQUENIL) 200 MG tablet Take 2 tablets by mouth daily with food or milk.    Marland Kitchen. losartan (COZAAR) 25 MG tablet Take 25 mg by mouth daily.    . pantoprazole (PROTONIX) 40 MG tablet Take 1 tablet (40 mg total) by mouth 2 (two) times daily. 60 tablet 5  . predniSONE (DELTASONE) 20 MG tablet Take 60 mg by mouth daily with breakfast.     . trimethoprim-polymyxin b (POLYTRIM) ophthalmic solution Place 2 drops into the left eye every 6 (six) hours. 10 mL 0   No current facility-administered medications on file prior to visit.     BP 120/60   Temp 98.4 F (36.9 C) (Oral)   Ht 6\' 5"  (1.956 m)   Wt 234 lb 6.4 oz (106.3 kg)   BMI 27.80 kg/m       Objective:   Physical Exam  Constitutional: He is oriented to person, place, and time. He appears well-developed and well-nourished. No distress.  HENT:  Head: Normocephalic and atraumatic.  Right Ear: External ear normal.  Left Ear: External ear normal.  Nose: Nose normal.  Mouth/Throat: Oropharynx is clear and moist. No oropharyngeal exudate.  Eyes: Conjunctivae and EOM are normal.  Pupils are equal, round, and reactive to light. Right eye exhibits no discharge. Left eye exhibits no discharge. No scleral icterus.  Neck: Normal range of motion. Neck supple. No thyromegaly present.  Cardiovascular: Normal rate, regular rhythm, normal heart sounds and intact distal pulses.  Exam reveals no gallop and no friction rub.   No murmur heard. Pulmonary/Chest: Effort normal and breath sounds normal. No respiratory distress. He has no wheezes. He has no rales.  Abdominal: Soft. Normal appearance, normal aorta and bowel sounds are normal. He exhibits no distension and no mass. There is no hepatosplenomegaly, splenomegaly or hepatomegaly. There is tenderness in the epigastric area and periumbilical area. There is no rigidity, no rebound, no guarding, no CVA tenderness, no  tenderness at McBurney's point and negative Murphy's sign.  Lymphadenopathy:    He has no cervical adenopathy.  Neurological: He is alert and oriented to person, place, and time.  Skin: Skin is warm and dry. No rash noted. He is not diaphoretic. No erythema. No pallor.  Psychiatric: He has a normal mood and affect. His behavior is normal. Judgment and thought content normal.  Nursing note reviewed.     Assessment & Plan:  1. Gastroenteritis, acute - likely viral gastroenteritis.  - ondansetron (ZOFRAN) 4 MG tablet; Take 1 tablet (4 mg total) by mouth every 8 (eight) hours as needed for nausea or vomiting.  Dispense: 20 tablet; Refill: 0 - Imodium 1/2 tab until formed bowel movement - Stay hydrated - Clear liquid and advance as tolerated.   Shirline Frees, NP

## 2016-09-29 NOTE — Patient Instructions (Addendum)
It was great meeting you today but I am sorry you are feeling so badly.   Your exam is consistent with Gastroenteritis.   Take zofran every 8 hours as needed for nausea and vomiting. Take 1/2 tab of imodium until you have a formed bowel movement  Stay hydrated with gatorade and chicken noodle soup.   Your symptoms should pass in the next 2-3 days  Viral Gastroenteritis Viral gastroenteritis is also known as stomach flu. This condition affects the stomach and intestinal tract. It can cause sudden diarrhea and vomiting. The illness typically lasts 3 to 8 days. Most people develop an immune response that eventually gets rid of the virus. While this natural response develops, the virus can make you quite ill. CAUSES  Many different viruses can cause gastroenteritis, such as rotavirus or noroviruses. You can catch one of these viruses by consuming contaminated food or water. You may also catch a virus by sharing utensils or other personal items with an infected person or by touching a contaminated surface. SYMPTOMS  The most common symptoms are diarrhea and vomiting. These problems can cause a severe loss of body fluids (dehydration) and a body salt (electrolyte) imbalance. Other symptoms may include:  Fever.  Headache.  Fatigue.  Abdominal pain. DIAGNOSIS  Your caregiver can usually diagnose viral gastroenteritis based on your symptoms and a physical exam. A stool sample may also be taken to test for the presence of viruses or other infections. TREATMENT  This illness typically goes away on its own. Treatments are aimed at rehydration. The most serious cases of viral gastroenteritis involve vomiting so severely that you are not able to keep fluids down. In these cases, fluids must be given through an intravenous line (IV). HOME CARE INSTRUCTIONS   Drink enough fluids to keep your urine clear or pale yellow. Drink small amounts of fluids frequently and increase the amounts as  tolerated.  Ask your caregiver for specific rehydration instructions.  Avoid:  Foods high in sugar.  Alcohol.  Carbonated drinks.  Tobacco.  Juice.  Caffeine drinks.  Extremely hot or cold fluids.  Fatty, greasy foods.  Too much intake of anything at one time.  Dairy products until 24 to 48 hours after diarrhea stops.  You may consume probiotics. Probiotics are active cultures of beneficial bacteria. They may lessen the amount and number of diarrheal stools in adults. Probiotics can be found in yogurt with active cultures and in supplements.  Wash your hands well to avoid spreading the virus.  Only take over-the-counter or prescription medicines for pain, discomfort, or fever as directed by your caregiver. Do not give aspirin to children. Antidiarrheal medicines are not recommended.  Ask your caregiver if you should continue to take your regular prescribed and over-the-counter medicines.  Keep all follow-up appointments as directed by your caregiver. SEEK IMMEDIATE MEDICAL CARE IF:   You are unable to keep fluids down.  You do not urinate at least once every 6 to 8 hours.  You develop shortness of breath.  You notice blood in your stool or vomit. This may look like coffee grounds.  You have abdominal pain that increases or is concentrated in one small area (localized).  You have persistent vomiting or diarrhea.  You have a fever.  The patient is a child younger than 3 months, and he or she has a fever.  The patient is a child older than 3 months, and he or she has a fever and persistent symptoms.  The patient is a  child older than 3 months, and he or she has a fever and symptoms suddenly get worse.  The patient is a baby, and he or she has no tears when crying. MAKE SURE YOU:   Understand these instructions.  Will watch your condition.  Will get help right away if you are not doing well or get worse.   This information is not intended to replace  advice given to you by your health care provider. Make sure you discuss any questions you have with your health care provider.   Document Released: 11/22/2005 Document Revised: 02/14/2012 Document Reviewed: 09/08/2011 Elsevier Interactive Patient Education [employer]

## 2016-11-26 DIAGNOSIS — M329 Systemic lupus erythematosus, unspecified: Secondary | ICD-10-CM | POA: Diagnosis not present

## 2016-11-26 DIAGNOSIS — M3214 Glomerular disease in systemic lupus erythematosus: Secondary | ICD-10-CM | POA: Diagnosis not present

## 2016-12-14 DIAGNOSIS — N032 Chronic nephritic syndrome with diffuse membranous glomerulonephritis: Secondary | ICD-10-CM | POA: Diagnosis not present

## 2016-12-14 DIAGNOSIS — N189 Chronic kidney disease, unspecified: Secondary | ICD-10-CM | POA: Diagnosis not present

## 2016-12-14 DIAGNOSIS — I129 Hypertensive chronic kidney disease with stage 1 through stage 4 chronic kidney disease, or unspecified chronic kidney disease: Secondary | ICD-10-CM | POA: Diagnosis not present

## 2016-12-14 DIAGNOSIS — D638 Anemia in other chronic diseases classified elsewhere: Secondary | ICD-10-CM | POA: Diagnosis not present

## 2017-03-23 DIAGNOSIS — M3214 Glomerular disease in systemic lupus erythematosus: Secondary | ICD-10-CM | POA: Diagnosis not present

## 2017-03-23 DIAGNOSIS — M329 Systemic lupus erythematosus, unspecified: Secondary | ICD-10-CM | POA: Diagnosis not present

## 2017-03-23 DIAGNOSIS — N032 Chronic nephritic syndrome with diffuse membranous glomerulonephritis: Secondary | ICD-10-CM | POA: Diagnosis not present

## 2017-03-23 DIAGNOSIS — I129 Hypertensive chronic kidney disease with stage 1 through stage 4 chronic kidney disease, or unspecified chronic kidney disease: Secondary | ICD-10-CM | POA: Diagnosis not present

## 2017-03-23 DIAGNOSIS — N181 Chronic kidney disease, stage 1: Secondary | ICD-10-CM | POA: Diagnosis not present

## 2017-03-23 DIAGNOSIS — D631 Anemia in chronic kidney disease: Secondary | ICD-10-CM | POA: Diagnosis not present

## 2017-03-23 DIAGNOSIS — N179 Acute kidney failure, unspecified: Secondary | ICD-10-CM | POA: Diagnosis not present

## 2017-03-25 ENCOUNTER — Encounter: Payer: Self-pay | Admitting: Family Medicine

## 2017-05-25 DIAGNOSIS — N181 Chronic kidney disease, stage 1: Secondary | ICD-10-CM | POA: Diagnosis not present

## 2017-05-25 DIAGNOSIS — D631 Anemia in chronic kidney disease: Secondary | ICD-10-CM | POA: Diagnosis not present

## 2017-06-21 DIAGNOSIS — M3214 Glomerular disease in systemic lupus erythematosus: Secondary | ICD-10-CM | POA: Diagnosis not present

## 2017-06-21 DIAGNOSIS — M329 Systemic lupus erythematosus, unspecified: Secondary | ICD-10-CM | POA: Diagnosis not present

## 2017-06-21 DIAGNOSIS — Z7952 Long term (current) use of systemic steroids: Secondary | ICD-10-CM | POA: Diagnosis not present

## 2017-06-22 DIAGNOSIS — N032 Chronic nephritic syndrome with diffuse membranous glomerulonephritis: Secondary | ICD-10-CM | POA: Diagnosis not present

## 2017-06-22 DIAGNOSIS — N181 Chronic kidney disease, stage 1: Secondary | ICD-10-CM | POA: Diagnosis not present

## 2017-06-22 DIAGNOSIS — N179 Acute kidney failure, unspecified: Secondary | ICD-10-CM | POA: Diagnosis not present

## 2017-06-22 DIAGNOSIS — I129 Hypertensive chronic kidney disease with stage 1 through stage 4 chronic kidney disease, or unspecified chronic kidney disease: Secondary | ICD-10-CM | POA: Diagnosis not present

## 2017-10-11 DIAGNOSIS — N181 Chronic kidney disease, stage 1: Secondary | ICD-10-CM | POA: Diagnosis not present

## 2017-10-20 DIAGNOSIS — Z23 Encounter for immunization: Secondary | ICD-10-CM | POA: Diagnosis not present

## 2017-10-20 DIAGNOSIS — N032 Chronic nephritic syndrome with diffuse membranous glomerulonephritis: Secondary | ICD-10-CM | POA: Diagnosis not present

## 2017-10-20 DIAGNOSIS — I129 Hypertensive chronic kidney disease with stage 1 through stage 4 chronic kidney disease, or unspecified chronic kidney disease: Secondary | ICD-10-CM | POA: Diagnosis not present

## 2017-10-20 DIAGNOSIS — N179 Acute kidney failure, unspecified: Secondary | ICD-10-CM | POA: Diagnosis not present

## 2017-10-20 DIAGNOSIS — N181 Chronic kidney disease, stage 1: Secondary | ICD-10-CM | POA: Diagnosis not present

## 2017-12-26 DIAGNOSIS — N181 Chronic kidney disease, stage 1: Secondary | ICD-10-CM | POA: Diagnosis not present

## 2017-12-26 DIAGNOSIS — M546 Pain in thoracic spine: Secondary | ICD-10-CM | POA: Diagnosis not present

## 2018-01-23 DIAGNOSIS — I129 Hypertensive chronic kidney disease with stage 1 through stage 4 chronic kidney disease, or unspecified chronic kidney disease: Secondary | ICD-10-CM | POA: Diagnosis not present

## 2018-01-30 DIAGNOSIS — I129 Hypertensive chronic kidney disease with stage 1 through stage 4 chronic kidney disease, or unspecified chronic kidney disease: Secondary | ICD-10-CM | POA: Diagnosis not present

## 2018-01-30 DIAGNOSIS — N181 Chronic kidney disease, stage 1: Secondary | ICD-10-CM | POA: Diagnosis not present

## 2018-01-30 DIAGNOSIS — N041 Nephrotic syndrome with focal and segmental glomerular lesions: Secondary | ICD-10-CM | POA: Diagnosis not present

## 2018-01-30 DIAGNOSIS — N179 Acute kidney failure, unspecified: Secondary | ICD-10-CM | POA: Diagnosis not present

## 2018-07-25 DIAGNOSIS — N181 Chronic kidney disease, stage 1: Secondary | ICD-10-CM | POA: Diagnosis not present

## 2018-08-03 DIAGNOSIS — N179 Acute kidney failure, unspecified: Secondary | ICD-10-CM | POA: Diagnosis not present

## 2018-08-03 DIAGNOSIS — I129 Hypertensive chronic kidney disease with stage 1 through stage 4 chronic kidney disease, or unspecified chronic kidney disease: Secondary | ICD-10-CM | POA: Diagnosis not present

## 2018-08-03 DIAGNOSIS — M3214 Glomerular disease in systemic lupus erythematosus: Secondary | ICD-10-CM | POA: Diagnosis not present

## 2018-08-03 DIAGNOSIS — N181 Chronic kidney disease, stage 1: Secondary | ICD-10-CM | POA: Diagnosis not present

## 2018-08-23 DIAGNOSIS — Z7952 Long term (current) use of systemic steroids: Secondary | ICD-10-CM | POA: Diagnosis not present

## 2018-08-23 DIAGNOSIS — M329 Systemic lupus erythematosus, unspecified: Secondary | ICD-10-CM | POA: Diagnosis not present

## 2018-08-23 DIAGNOSIS — M2550 Pain in unspecified joint: Secondary | ICD-10-CM | POA: Diagnosis not present

## 2018-08-23 DIAGNOSIS — M3214 Glomerular disease in systemic lupus erythematosus: Secondary | ICD-10-CM | POA: Diagnosis not present

## 2018-10-30 ENCOUNTER — Ambulatory Visit: Payer: Federal, State, Local not specified - PPO | Admitting: Family Medicine

## 2018-10-30 ENCOUNTER — Encounter: Payer: Self-pay | Admitting: Family Medicine

## 2018-10-30 VITALS — BP 110/80 | HR 85 | Temp 98.3°F | Wt 246.8 lb

## 2018-10-30 DIAGNOSIS — M25511 Pain in right shoulder: Secondary | ICD-10-CM | POA: Diagnosis not present

## 2018-10-30 DIAGNOSIS — M25473 Effusion, unspecified ankle: Secondary | ICD-10-CM | POA: Diagnosis not present

## 2018-10-30 DIAGNOSIS — R82998 Other abnormal findings in urine: Secondary | ICD-10-CM | POA: Diagnosis not present

## 2018-10-30 LAB — POCT URINALYSIS DIPSTICK
BILIRUBIN UA: NEGATIVE
Glucose, UA: NEGATIVE
KETONES UA: NEGATIVE
Leukocytes, UA: NEGATIVE
NITRITE UA: NEGATIVE
PH UA: 7.5 (ref 5.0–8.0)
Protein, UA: POSITIVE — AB
Spec Grav, UA: 1.015 (ref 1.010–1.025)
UROBILINOGEN UA: 4 U/dL — AB

## 2018-10-30 NOTE — Patient Instructions (Signed)
Shoulder Impingement Syndrome Shoulder impingement syndrome is a condition that causes pain when connective tissues (tendons) surrounding the shoulder joint become pinched. These tendons are part of the group of muscles and tissues that help to stabilize the shoulder (rotator cuff). Beneath the rotator cuff is a fluid-filled sac (bursa) that allows the muscles and tendons to glide smoothly. The bursa may become swollen or irritated (bursitis). Bursitis, swelling in the rotator cuff tendons, or both conditions can decrease how much space is under a bone in the shoulder joint (acromion), resulting in impingement. What are the causes? Shoulder impingement syndrome can be caused by bursitis or swelling of the rotator cuff tendons, which may result from:  Repetitive overhead arm movements.  Falling onto the shoulder.  Weakness in the shoulder muscles.  What increases the risk? You may be more likely to develop this condition if you are an athlete who participates in:  Sports that involve throwing, such as baseball.  Tennis.  Swimming.  Volleyball.  Some people are also more likely to develop impingement syndrome because of the shape of their acromion bone. What are the signs or symptoms? The main symptom of this condition is pain on the front or side of the shoulder. Pain may:  Get worse when lifting or raising the arm.  Get worse at night.  Wake you up from sleeping.  Feel sharp when the shoulder is moved, and then fade to an ache.  Other signs and symptoms may include:  Tenderness.  Stiffness.  Inability to raise the arm above shoulder level or behind the body.  Weakness.  How is this diagnosed? This condition may be diagnosed based on:  Your symptoms.  Your medical history.  A physical exam.  Imaging tests, such as: ? X-rays. ? MRI. ? Ultrasound.  How is this treated? Treatment for this condition may include:  Resting your shoulder and avoiding all  activities that cause pain or put stress on the shoulder.  Icing your shoulder.  NSAIDs to help reduce pain and swelling.  One or more injections of medicines to numb the area and reduce inflammation.  Physical therapy.  Surgery. This may be needed if nonsurgical treatments have not helped. Surgery may involve repairing the rotator cuff, reshaping the acromion, or removing the bursa.  Follow these instructions at home: Managing pain, stiffness, and swelling  If directed, apply ice to the injured area. ? Put ice in a plastic bag. ? Place a towel between your skin and the bag. ? Leave the ice on for 20 minutes, 2-3 times a day. Activity  Rest and return to your normal activities as told by your health care provider. Ask your health care provider what activities are safe for you.  Do exercises as told by your health care provider. General instructions  Do not use any tobacco products, including cigarettes, chewing tobacco, or e-cigarettes. Tobacco can delay healing. If you need help quitting, ask your health care provider.  Ask your health care provider when it is safe for you to drive.  Take over-the-counter and prescription medicines only as told by your health care provider.  Keep all follow-up visits as told by your health care provider. This is important. How is this prevented?  Give your body time to rest between periods of activity.  Be safe and responsible while being active to avoid falls.  Maintain physical fitness, including strength and flexibility. Contact a health care provider if:  Your symptoms have not improved after 1-2 months of treatment and   rest.  You cannot lift your arm away from your body. This information is not intended to replace advice given to you by your health care provider. Make sure you discuss any questions you have with your health care provider. Document Released: 11/22/2005 Document Revised: 07/29/2016 Document Reviewed:  10/25/2015 Elsevier Interactive Patient Education  2018 Elsevier Inc.  

## 2018-10-30 NOTE — Progress Notes (Signed)
Subjective:     Patient ID: Austin [name], male   DOB: [birth year], 42 y.o.   MRN: [id number]  HPI Patient has chronic problems including systemic lupus, history of focal segmental glomerulosclerosis, hypertension, pancytopenia.  He is followed by nephrology and rheumatology.  He has been tapered off prednisone.  Presents today for the following items  Right shoulder pain for several months.  Onset about 4 months ago.  He works for [employer] and does lots of lifting- sometimes over 70 pounds.  Denies any specific injury.  Occasional right-sided neck pain.  Developing decreased range of motion.  Extreme pain with internal rotation and also abduction.  Denies prior history of shoulder difficulties.  Some night pain.  Second issue is very dark-colored urine for the past week or so.  No burning with urination.  No jaundice.  Third issue is some bilateral ankle edema right greater than left for the past couple weeks.  His edema actually seems to be worse first thing in the morning and improves through the day.  No increased dyspnea.  Weight is stable.  Past Medical History:  Diagnosis Date  . HYPERLIPIDEMIA 02/18/2009  . HYPERTENSION 02/18/2009  . Lupus glomerulonephritis (HCC)   . Nephritis   . Pancytopenia [facility](HCC)    Past Surgical History:  Procedure Laterality Date  . RENAL BIOPSY  2003  . TONSILLECTOMY      reports that he has never smoked. He has never used smokeless tobacco. He reports that he drinks alcohol. He reports that he does not use drugs. family history includes Cancer in his maternal grandfather; Diabetes in his maternal grandmother; Hyperlipidemia in his father; Hypertension in his maternal grandfather and maternal grandmother. No Known Allergies   Review of Systems  Constitutional: Negative for chills, fever and unexpected weight change.  Respiratory: Negative for cough and shortness of breath.   Cardiovascular: Negative for chest pain.  Gastrointestinal:  Negative for abdominal pain, blood in stool, nausea and vomiting.  Genitourinary: Negative for hematuria.  Musculoskeletal: Positive for arthralgias.       Objective:   Physical Exam  Constitutional: He is oriented to person, place, and time. He appears well-developed and well-nourished.  Cardiovascular: Normal rate and regular rhythm.  Pulmonary/Chest: Effort normal and breath sounds normal. He has no wheezes. He has no rales.  Musculoskeletal: He exhibits no edema.  No edema whatsoever noted in feet ,ankles, or lower legs today at this time  Patient has pain with internal rotation and abduction with minimal resistance.  He has some mild subacromial tenderness on the right shoulder.  No acromioclavicular tenderness.  Neurological: He is alert and oriented to person, place, and time.       Assessment:     #1 progressive right shoulder pain over several months.  Concern for possible developing adhesive capsulitis. ?rotator cuff tendonitis.  #2 dark urine with 4.0 urobilinogen on dipstick.  He is not jaundiced.  Can see increased urobilinogen with the following -Increased bilirubin production such as hemolysis -Insufficient hepatic clearance of bilirubin -Excessive exposure of bilirubin in the intestines to bacteria such as constipation or bacterial overgrowth  #3 patient reporting increased ankle edema though none noted today on exam    Plan:     -Check further labs with hepatic panel, CBC, basic metabolic panel -Set up sports medicine referral -Patient requesting note out of work.  He states they have no light duty.  We have written him out from today through December 3 until we can get  him to see sports medicine.  Kristian Covey MD [facility]

## 2018-10-31 LAB — BASIC METABOLIC PANEL
BUN: 13 mg/dL (ref 6–23)
CHLORIDE: 105 meq/L (ref 96–112)
CO2: 27 meq/L (ref 19–32)
CREATININE: 0.87 mg/dL (ref 0.40–1.50)
Calcium: 8.6 mg/dL (ref 8.4–10.5)
GFR: 122.13 mL/min (ref >60.00)
Glucose, Bld: 85 mg/dL (ref 70–99)
Potassium: 3.5 mEq/L (ref 3.5–5.1)
SODIUM: 138 meq/L (ref 135–145)

## 2018-10-31 LAB — CBC WITH DIFFERENTIAL/PLATELET
BASOS PCT: 1.7 % (ref 0.0–3.0)
Basophils Absolute: 0.1 10*3/uL (ref 0.0–0.1)
EOS PCT: 1.2 % (ref 0.0–5.0)
Eosinophils Absolute: 0 10*3/uL (ref 0.0–0.7)
HEMATOCRIT: 39.4 % (ref 39.0–52.0)
Hemoglobin: 13.3 g/dL (ref 13.0–17.0)
LYMPHS ABS: 1.4 10*3/uL (ref 0.7–4.0)
LYMPHS PCT: 42.7 % (ref 12.0–46.0)
MCHC: 33.8 g/dL (ref 30.0–36.0)
MCV: 85.5 fl (ref 78.0–100.0)
MONOS PCT: 11.2 % (ref 3.0–12.0)
Monocytes Absolute: 0.4 10*3/uL (ref 0.1–1.0)
NEUTROS ABS: 1.4 10*3/uL (ref 1.4–7.7)
NEUTROS PCT: 43.2 % (ref 43.0–77.0)
PLATELETS: 197 10*3/uL (ref 150.0–400.0)
RBC: 4.61 Mil/uL (ref 4.22–5.81)
RDW: 12.6 % (ref 11.5–15.5)
WBC: 3.3 10*3/uL — AB (ref 4.0–10.5)

## 2018-10-31 LAB — HEPATIC FUNCTION PANEL
ALBUMIN: 4 g/dL (ref 3.5–5.2)
ALT: 19 U/L (ref 0–53)
AST: 17 U/L (ref 0–37)
Alkaline Phosphatase: 48 U/L (ref 39–117)
Bilirubin, Direct: 0.1 mg/dL (ref 0.0–0.3)
TOTAL PROTEIN: 8.1 g/dL (ref 6.0–8.3)
Total Bilirubin: 0.8 mg/dL (ref 0.2–1.2)

## 2018-11-06 ENCOUNTER — Ambulatory Visit
Admission: RE | Admit: 2018-11-06 | Discharge: 2018-11-06 | Disposition: A | Discharge status: Home / Self Care | Payer: Federal, State, Local not specified - PPO | Source: Ambulatory Visit | Attending: Sports Medicine | Admitting: Sports Medicine

## 2018-11-06 ENCOUNTER — Encounter: Payer: Self-pay | Admitting: Sports Medicine

## 2018-11-06 ENCOUNTER — Ambulatory Visit (INDEPENDENT_AMBULATORY_CARE_PROVIDER_SITE_OTHER): Payer: Federal, State, Local not specified - PPO | Admitting: Sports Medicine

## 2018-11-06 DIAGNOSIS — M25511 Pain in right shoulder: Secondary | ICD-10-CM

## 2018-11-06 NOTE — Progress Notes (Signed)
   Subjective:    Patient ID: Austin [name], male    DOB: 03-10-1976, 42 y.o.   MRN: [id number]  HPI Austin [name] is a 42yo male with a h/o Lupus and FSGS presenting for R shoulder pain evaluation referred by his PCP. Pt reports R shoulder pain started about 3-4 months ago. Denies specific inciting trauma or injury but he does work as a rural Engineer, agriculturalpostal office deliverer. He reports he travels in his own car sitting in the middle console and has to reach across his body to deliver the mail. He reports more recently he has had to deal with larger freight and heavier packages. He is R hand dominant and pain is worse at night when he sleeps on his side. He reports associated numbness and tingling in the fourth and fifth digits in his R hand. Denies elbow pain. Pain localized to anterior shoulder and posterior shoulder over the RC muscles. Pain is constant and dull. He had been immobilized in a sling which helped a little bit. Pain does not radiate. He is limited in what he can take for pain due to his chronic medical conditions. He is taking Tylenol as needed which isn't offering much relief. He also complains of limited ROM as well with flexion, extension and abduction of his R shoulder.   Review of Systems As listed above    Objective:   Physical Exam  Constitutional: He appears well-developed and well-nourished.  Pt resting comfortably on exam bed table in NAD MSK R shoulder exam: Inspection: No bruising, swelling or obvious deformity Palpation: + TTP proximal biceps tendon, AC joint, trapezius muscle belly Active ROM reduced with flexion about 90 degrees, abduction about 50 degrees and extension unable to perform due to pain.  4+/5 strength with resisted supraspinatus and external rotation. Specialty testing: + Neer's. + Hawkin's. + Empty can test. O'brien's equivocal. + Speed's test        Assessment & Plan:  Austin [name] is a 42yo male with a h/o lupus and FSGS presenting for R shoulder  pain evaluation.  Plan: -XR R shoulder AP and axillary view -MRI R shoulder to evaluate for RC dysfunction and shoulder impingement -Consider PT vs steroid injection vs ortho referral pending images -Recommend remaining off work until imaging and follow up completed -Tylenol for pain prn -Avoid NSAIDs due to chronic medical condition -Ice/heat as directed -Massage therapy as tolerated -Education provided  RTC after imaging completed for FU.  Staff: Dr. Maree Erieraper  Sean Wilen, MD FP, PGY-3  Patient seen and evaluated with the resident.  I agree with the above plan of care.  X-rays are unremarkable.  No evidence of glenohumeral DJD.  Proceed with MRI as above specifically to rule out rotator cuff tear versus subacromial impingement.  Patient is currently out of work and will remain out of work until follow-up 1 to 2 days after his MRI.  We will delineate further treatment based on those findings as well as his return to work status.

## 2018-11-19 ENCOUNTER — Ambulatory Visit
Admission: RE | Admit: 2018-11-19 | Discharge: 2018-11-19 | Disposition: A | Discharge status: Home / Self Care | Payer: Federal, State, Local not specified - PPO | Source: Ambulatory Visit | Attending: Sports Medicine | Admitting: Sports Medicine

## 2018-11-19 DIAGNOSIS — M25511 Pain in right shoulder: Secondary | ICD-10-CM | POA: Diagnosis not present

## 2018-11-21 ENCOUNTER — Encounter: Payer: Self-pay | Admitting: Sports Medicine

## 2018-11-21 ENCOUNTER — Ambulatory Visit: Payer: Federal, State, Local not specified - PPO | Admitting: Sports Medicine

## 2018-11-21 VITALS — BP 116/72 | Ht 77.0 in | Wt 248.0 lb

## 2018-11-21 DIAGNOSIS — M25511 Pain in right shoulder: Secondary | ICD-10-CM

## 2018-11-21 NOTE — Patient Instructions (Signed)
We have referred you to see Dr. Ave Filterhandler at [facility]. [address] [place], [place] [phone]  Appt: Friday 12/01/2018 @ 9 am. Please arrive 30 mins before your appt.

## 2018-11-22 NOTE — Progress Notes (Signed)
   Subjective:    Patient ID: Austin [name], male    DOB: [birth year], 42 y.o.   MRN: [id number]  HPI   Austin [name] comes in today to discuss MRI findings of the right shoulder.  Dominant finding is prominent marrow edema in the distal clavicle.  There is a small joint effusion at the acromioclavicular joint as well.  No evidence of rotator cuff tear.  Slight irregularity of the superior labrum which may suggest a degenerative tear.  Austin [name]'s shoulder pain has improved but not resolved.  He continues to localize the pain diffusely to the anterior and superior shoulder.  Still having difficulty sleeping at night.  He remains out of work.   Review of Systems    As above Objective:   Physical Exam  Well-developed, well-nourished.  No acute distress.  Sitting comfortably in the exam room  Right shoulder: Examination of the right shoulder is limited by pain.  He is tender to palpation along the distal clavicle and over the acromioclavicular joint.  He does have decreased strength secondary to pain but nothing focal.  He is neurovascularly intact distally.  MRI is as above      Assessment & Plan:   Right shoulder pain secondary to significant distal clavicle edema of unknown etiology  This is an unusual case.  I do not see any evidence of rotator cuff tearing and I think the small labral tear seen is incidental.  I believe most of his pain is originating from the edema in his distal clavicle.  I would like to elicit the input of Dr Ave Filterhandler at [facility].  I will defer further work-up and treatment to his discretion.  In the meantime, patient will remain out of work until that consultation.

## 2018-12-01 DIAGNOSIS — M24811 Other specific joint derangements of right shoulder, not elsewhere classified: Secondary | ICD-10-CM | POA: Diagnosis not present

## 2018-12-22 DIAGNOSIS — M24811 Other specific joint derangements of right shoulder, not elsewhere classified: Secondary | ICD-10-CM | POA: Diagnosis not present

## 2019-10-07 ENCOUNTER — Other Ambulatory Visit: Payer: Self-pay

## 2019-10-07 ENCOUNTER — Ambulatory Visit (HOSPITAL_COMMUNITY)
Admission: EM | Admit: 2019-10-07 | Discharge: 2019-10-07 | Disposition: A | Discharge status: Home / Self Care | Payer: Worker's Compensation | Attending: Internal Medicine | Admitting: Internal Medicine

## 2019-10-07 DIAGNOSIS — M62830 Muscle spasm of back: Secondary | ICD-10-CM | POA: Diagnosis not present

## 2019-10-07 MED ORDER — KETOROLAC TROMETHAMINE 60 MG/2ML IM SOLN: 60.0000 mg | Freq: Once | INTRAMUSCULAR | Status: DC

## 2019-10-07 MED ORDER — DEXAMETHASONE SODIUM PHOSPHATE 10 MG/ML IJ SOLN
INTRAMUSCULAR | Status: AC
  Filled 2019-10-07: qty 1

## 2019-10-07 MED ORDER — DEXAMETHASONE SODIUM PHOSPHATE 10 MG/ML IJ SOLN
10.0000 mg | Freq: Once | INTRAMUSCULAR | Status: AC
  Administered 2019-10-07: 10 mg via INTRAMUSCULAR

## 2019-10-07 MED ORDER — CYCLOBENZAPRINE HCL 10 MG PO TABS: 10.0000 mg | ORAL_TABLET | Freq: Two times a day (BID) | ORAL | 0 refills | Status: DC | PRN

## 2019-10-07 MED ORDER — NAPROXEN 375 MG PO TABS: 375.0000 mg | ORAL_TABLET | Freq: Two times a day (BID) | ORAL | 0 refills | Status: DC

## 2019-10-07 NOTE — ED Provider Notes (Signed)
[facility]    CSN: [id number] Arrival date & time: 10/07/19  1325      History   Chief Complaint Chief Complaint  Patient presents with  . Back Pain    HPI Austin [name] is a 43 y.o. male with history of hypertension-controlled, lupus glomerulonephritis comes to urgent care with complaints of low back pain of 3 days duration.  Patient's job requires him to do repetitive movements.  Was picking up a box 3 days ago he experienced sharp pain in the lower back.  Pain has been persistent over the past 3 days.  Pain is worse when he tries to bend over or move.  No known alleviating factors.  Is associated with some radiation into the left thigh.  No weakness in the lower extremities.  No bowel or bladder incontinence.  No perineal loss of sensation. HPI  Past Medical History:  Diagnosis Date  . HYPERLIPIDEMIA 02/18/2009  . HYPERTENSION 02/18/2009  . Lupus glomerulonephritis (HCC)   . Nephritis   . Pancytopenia [facility](HCC)     Patient Active Problem List   Diagnosis Date Noted  . On prednisone therapy 01/28/2014  . Lupus (systemic lupus erythematosus) (HCC) 10/02/2013  . Hyponatremia 10/02/2013  . Acute renal failure (HCC) 09/30/2013  . Other pancytopenia (HCC) 09/29/2013  . Anemia of chronic disease 09/29/2013  . Joint pain 09/28/2013  . 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 LUMBAGO 07/08/2009  . HYPERTENSION 02/18/2009  . HIP PAIN, LEFT, CHRONIC 02/18/2009    Past Surgical History:  Procedure Laterality Date  . RENAL BIOPSY  2003  . TONSILLECTOMY         Home Medications    Prior to Admission medications   Medication Sig Start Date End Date Taking? Authorizing Provider  acetaminophen (TYLENOL) 500 MG tablet Take 1,000 mg by mouth every 6 (six) hours as needed for moderate pain or headache.    [provider]  cyclobenzaprine (FLEXERIL)  10 MG tablet Take 1 tablet (10 mg total) by mouth 2 (two) times daily as needed for muscle spasms. 10/07/19   Merrilee JanskyLamptey,  O, MD  enalapril (VASOTEC) 5 MG tablet Take 5 mg by mouth daily.    Terrial Rhodesoladonato, Joseph, MD  hydroxychloroquine (PLAQUENIL) 200 MG tablet Take 2 tablets by mouth daily with food or milk. 01/10/14   [provider]  pantoprazole (PROTONIX) 40 MG tablet Take 1 tablet (40 mg total) by mouth 2 (two) times daily. 11/02/13   Burchette, Elberta FortisBruce W, MD    Family History Family History  Problem Relation Age of Onset  . Hypertension Maternal Grandmother   . Diabetes Maternal Grandmother   . Hypertension Maternal Grandfather   . Cancer Maternal Grandfather        prostate  . Hyperlipidemia Father     Social History Social History   Tobacco Use  . Smoking status: Never Smoker  . Smokeless tobacco: Never Used  Substance Use Topics  . Alcohol use: Yes    Comment: rare  . Drug use: No     Allergies   Patient has no known allergies.   Review of Systems Review of Systems  Constitutional: Negative.   HENT: Negative.   Cardiovascular: Negative.   Gastrointestinal: Negative.   Genitourinary: Negative.   Musculoskeletal: Positive for arthralgias, back pain and myalgias. Negative for gait problem, joint swelling, neck pain and neck stiffness.  Neurological: Negative for dizziness, facial asymmetry, weakness,  numbness and headaches.     Physical Exam Triage Vital Signs ED Triage Vitals [10/07/19 1406]  Enc Vitals Group     BP 119/70     Pulse Rate 72     Resp 18     Temp 98.4 F (36.9 C)     Temp src      SpO2 100 %     Weight      Height      Head Circumference      Peak Flow      Pain Score      Pain Loc      Pain Edu?      Excl. in GC?    No data found.  Updated Vital Signs BP 119/70 (BP Location: Right Arm)   Pulse 72   Temp 98.4 F (36.9 C)   Resp 18   SpO2 100%   Visual Acuity Right Eye Distance:   Left Eye Distance:   Bilateral  Distance:    Right Eye Near:   Left Eye Near:    Bilateral Near:     Physical Exam Vitals signs and nursing note reviewed.  Constitutional:      General: He is in acute distress.     Appearance: He is not ill-appearing.  HENT:     Right Ear: Tympanic membrane normal.     Left Ear: Tympanic membrane normal.  Cardiovascular:     Rate and Rhythm: Normal rate and regular rhythm.     Pulses: Normal pulses.     Heart sounds: No murmur. No friction rub. No gallop.   Pulmonary:     Effort: Pulmonary effort is normal. No respiratory distress.     Breath sounds: Normal breath sounds. No rhonchi or rales.  Abdominal:     General: Abdomen is flat. Bowel sounds are normal. There is no distension.     Hernia: No hernia is present.  Musculoskeletal:        General: No swelling, tenderness or signs of injury.     Comments: Limited range of motion over the lower back.  Skin:    General: Skin is warm.     Capillary Refill: Capillary refill takes less than 2 seconds.     Findings: No bruising, erythema or lesion.  Neurological:     General: No focal deficit present.     Mental Status: He is alert and oriented to person, place, and time.      UC Treatments / Results  Labs (all labs ordered are listed, but only abnormal results are displayed) Labs Reviewed - No data to display  EKG   Radiology No results found.  Procedures Procedures (including critical care time)  Medications Ordered in UC Medications  dexamethasone (DECADRON) injection 10 mg (10 mg Intramuscular Given 10/07/19 1459)  dexamethasone (DECADRON) 10 MG/ML injection (has no administration in time range)    Initial Impression / Assessment and Plan / UC Course  I have reviewed the triage vital signs and the nursing notes.  Pertinent labs & imaging results that were available during my care of the patient were reviewed by me and considered in my medical decision making (see chart for details).     1.  Lower back  pain-musculoskeletal with possible radiculopathy: Dexamethasone 10 mg IM Patient has a history of nephritis so I will stay away from NSAID use Flexeril 10 mg twice daily as needed for back pain and joint stiffness. If patient develops any bowel or bladder concerns, weakness in the  lower extremity or progressive numbness, patient is advised to call urgent care to be reevaluated.   Final Clinical Impressions(s) / UC Diagnoses   Final diagnoses:  Muscle spasm of back   Discharge Instructions   None    ED Prescriptions    Medication Sig Dispense Auth. Provider   cyclobenzaprine (FLEXERIL) 10 MG tablet Take 1 tablet (10 mg total) by mouth 2 (two) times daily as needed for muscle spasms. 20 tablet , Myrene Galas, MD   naproxen (NAPROSYN) 375 MG tablet  (Status: Discontinued) Take 1 tablet (375 mg total) by mouth 2 (two) times daily. 20 tablet , Myrene Galas, MD     PDMP not reviewed this encounter.   Chase Picket, MD 10/07/19 [phone]

## 2019-10-07 NOTE — ED Triage Notes (Signed)
Pt states he felt a muscle pain in his lower back and pt states pain is radiating dow his leg. This has going for 3 days.

## 2019-10-09 ENCOUNTER — Ambulatory Visit: Payer: Federal, State, Local not specified - PPO | Admitting: Family Medicine

## 2019-10-09 ENCOUNTER — Encounter: Payer: Self-pay | Admitting: Family Medicine

## 2019-10-09 ENCOUNTER — Other Ambulatory Visit: Payer: Self-pay

## 2019-10-09 VITALS — HR 88 | Temp 97.6°F | Resp 16 | Ht 77.0 in | Wt 242.2 lb

## 2019-10-09 DIAGNOSIS — M545 Low back pain, unspecified: Secondary | ICD-10-CM

## 2019-10-09 DIAGNOSIS — S3992XD Unspecified injury of lower back, subsequent encounter: Secondary | ICD-10-CM

## 2019-10-09 DIAGNOSIS — R202 Paresthesia of skin: Secondary | ICD-10-CM

## 2019-10-09 DIAGNOSIS — R200 Anesthesia of skin: Secondary | ICD-10-CM | POA: Diagnosis not present

## 2019-10-09 MED ORDER — PREDNISONE 20 MG PO TABS: ORAL_TABLET | ORAL | 0 refills | Status: AC

## 2019-10-09 NOTE — Patient Instructions (Addendum)
A few things to remember from today's visit:   Injury of back, subsequent encounter - Plan: Ambulatory referral to Sports Medicine  Acute midline low back pain, unspecified whether sciatica present - Plan: Ambulatory referral to Sports Medicine  Numbness and tingling of left lower extremity - Plan: predniSONE (DELTASONE) 20 MG tablet  Avoid prolonged sitting or prolonged standing. Avoid twisting. No heavy lifting until you follow-up with PCP or sport medicine provider.  If lower extremity numbness gets worse or you have urine/bowel incontinence, these are emergency you need to seek immediate medical attention. Take prednisone with breakfast. Stop naproxen. Continue Flexeril. You can use over-the-counter IcyHot patch or lidocaine patch. Appointment with sports medicine will be arranged.  Please be sure medication list is accurate. If a new problem present, please set up appointment sooner than planned today.

## 2019-10-09 NOTE — Progress Notes (Signed)
ACUTE VISIT   HPI:  Chief Complaint  Patient presents with  . Back Pain    work injury    [name] is a 43 y.o. male with hx of HTN,lupus,and nephrotic synd, who is here today complaining of mid lower back pain.  He was seen in the ED on 10/07/2019 due to same problem.  He is reporting this as work related injury. He stated with back pain on 10/05/19,attributed to prolonged standing and lifting at work.  He works at the post [employer], working longer hours.  On 10/06/2019 when he was working he felt a mid lower back pulling sensation. Lower back pain got worse, 9/10.  Pain is not radiated, heavy sensation, now it is 6/10 in intensity, with no associated urinary incontinence or retention, stool incontinence, or saddle anesthesia. Left lower extremity intermittent numbness and tingling sensation, lateral aspect of thighs mainly.  Exacerbated by prolonged standing and prolonged sitting. Alleviated by position changes. No rash or edema on area, fever, chills, or abnormal wt loss.  Prior Hx of back pain: Denies.  Currently he is on Flexeril 10 mg 3 times daily as needed and naproxen 500 mg bid.  He would like to be referred to a "specialist."  Review of Systems  Constitutional: Positive for activity change. Negative for appetite change and fatigue.  Respiratory: Negative for cough, shortness of breath and wheezing.   Cardiovascular: Negative for leg swelling.  Gastrointestinal: Negative for abdominal pain, nausea and vomiting.       No changes in bowel habits.  Genitourinary: Negative for decreased urine volume, dysuria and hematuria.  Neurological: Negative for weakness and headaches.  Psychiatric/Behavioral: Negative for confusion. The patient is nervous/anxious.   Rest see pertinent positives and negatives per HPI.  Current Outpatient Medications on File Prior to Visit  Medication Sig Dispense Refill  . acetaminophen (TYLENOL) 500 MG tablet Take 1,000 mg  by mouth every 6 (six) hours as needed for moderate pain or headache.    . cyclobenzaprine (FLEXERIL) 10 MG tablet Take 1 tablet (10 mg total) by mouth 2 (two) times daily as needed for muscle spasms. 20 tablet 0  . enalapril (VASOTEC) 5 MG tablet Take 5 mg by mouth daily.    . hydroxychloroquine (PLAQUENIL) 200 MG tablet Take 2 tablets by mouth daily with food or milk.    . pantoprazole (PROTONIX) 40 MG tablet Take 1 tablet (40 mg total) by mouth 2 (two) times daily. 60 tablet 5   No current facility-administered medications on file prior to visit.    Past Medical History:  Diagnosis Date  . HYPERLIPIDEMIA 02/18/2009  . HYPERTENSION 02/18/2009  . Lupus glomerulonephritis ([place])   . Nephritis   . Pancytopenia ([place])    No Known Allergies  Social History   Socioeconomic History  . Marital status: Married    Spouse name: Not on file  . Number of children: Not on file  . Years of education: Not on file  . Highest education level: Not on file  Occupational History  . Not on file  Social Needs  . Financial resource strain: Not on file  . Food insecurity    Worry: Not on file    Inability: Not on file  . Transportation needs    Medical: Not on file    Non-medical: Not on file  Tobacco Use  . Smoking status: Never Smoker  . Smokeless tobacco: Never Used  Substance and Sexual Activity  . Alcohol use: Yes  Comment: rare  . Drug use: No  . Sexual activity: Not on file  Lifestyle  . Physical activity    Days per week: Not on file    Minutes per session: Not on file  . Stress: Not on file  Relationships  . Social Musician on phone: Not on file    Gets together: Not on file    Attends religious service: Not on file    Active member of club or organization: Not on file    Attends meetings of clubs or organizations: Not on file    Relationship status: Not on file  Other Topics Concern  . Not on file  Social History Narrative  . Not on file    Vitals:    10/09/19 1046  Pulse: 88  Resp: 16  Temp: 97.6 F (36.4 C)  SpO2: 97%   Body mass index is 28.72 kg/m.   Physical Exam  Nursing note and vitals reviewed. Constitutional: He is oriented to person, place, and time. He appears well-developed. No distress.  HENT:  Head: Normocephalic and atraumatic.  Mouth/Throat: Oropharynx is clear and moist and mucous membranes are normal.  Eyes: Conjunctivae are normal.  Cardiovascular: Normal rate and regular rhythm.  No murmur heard. Respiratory: Effort normal and breath sounds normal. No respiratory distress.  GI: Soft. He exhibits no mass. There is no abdominal tenderness.  Musculoskeletal:        General: No edema.     Lumbar back: He exhibits tenderness and spasm. He exhibits normal range of motion and no bony tenderness.       Back:     Comments: No significant deformity appreciated on affected area. ? thoracic scoliosis. There is tenderness upon palpation of right-sided lumbar paraspinal muscles. Pain elicited with movement on exam table during examination. No local edema or erythema appreciated, no suspicious lesions.   Neurological: He is alert and oriented to person, place, and time. He has normal strength.  Reflex Scores:      Patellar reflexes are 2+ on the right side and 2+ on the left side. Antalgic gait.  Skin: Skin is warm. No erythema.  Psychiatric: His mood appears anxious.  Well groomed,good eye contact.    ASSESSMENT AND PLAN:  [name] was seen today for back pain.  Diagnoses and all orders for this visit:  Injury of back, subsequent encounter -     Ambulatory referral to Sports Medicine  Acute midline low back pain, unspecified whether sciatica present Pain is not better. Recommend avoiding NSAID's. Acetaminophen 500 mg 3-4 times per day prn. Continue Flexeril,side effects discussed. OTC Icy hot or lidocaine patch may also help. Relative rest. Note for work given.  -     Ambulatory referral to Sports  Medicine  Numbness and tingling of left lower extremity ? Radiculopathy. Prednisone side effects discussed,recommend taking it with breakfast. Instructed about warning signs.  -     predniSONE (DELTASONE) 20 MG tablet; 3 tabs for 3 days, 2 tabs for 3 days, 1 tabs for 3 days, and 1/2 tab for 3 days. Take tables together with breakfast.   Return in about 6 days (around 10/15/2019).   -[name] was advised to seek immediate medical attention if sudden worsening symptoms.   Doyce Saling G. [place], MD  [facility]. [place] office.

## 2019-10-15 ENCOUNTER — Other Ambulatory Visit: Payer: Self-pay

## 2019-10-15 ENCOUNTER — Encounter: Payer: Self-pay | Admitting: Family Medicine

## 2019-10-15 ENCOUNTER — Ambulatory Visit (INDEPENDENT_AMBULATORY_CARE_PROVIDER_SITE_OTHER): Payer: Federal, State, Local not specified - PPO

## 2019-10-15 ENCOUNTER — Ambulatory Visit (INDEPENDENT_AMBULATORY_CARE_PROVIDER_SITE_OTHER): Admitting: Family Medicine

## 2019-10-15 VITALS — BP 102/70 | HR 91 | Ht 77.0 in | Wt 247.6 lb

## 2019-10-15 DIAGNOSIS — M5416 Radiculopathy, lumbar region: Secondary | ICD-10-CM | POA: Diagnosis not present

## 2019-10-15 DIAGNOSIS — S39012A Strain of muscle, fascia and tendon of lower back, initial encounter: Secondary | ICD-10-CM

## 2019-10-15 DIAGNOSIS — M545 Low back pain: Secondary | ICD-10-CM | POA: Diagnosis not present

## 2019-10-15 MED ORDER — GABAPENTIN 300 MG PO CAPS: ORAL_CAPSULE | ORAL | 3 refills | Status: DC

## 2019-10-15 NOTE — Progress Notes (Signed)
Subjective:    I'm seeing this patient as a consultation for:  Dr. Martinique  I, Molly Weber, LAT, ATC, am serving as scribe for Dr. Lynne Leader.  CC: Low back pain w/ L LE radicular symptoms  HPI: Pt is a 43 y/o male presenting w/ c/o low back pain and L thigh radicular symptoms.  Pt states that he feels this is a work-related injury that began on 10/05/19.  He states that he has been doing a lot more prolonged standing on concrete and then tried to pick-up a heavier package on Saturday 10/06/19 when he felt a pull in his lower back.  He works at the [employer] and does a lot of prolonged standing and lifting.  Aggravating factors include prolonged sitting and standing.  He has tried Naproxen 500mg  bid, Flexeril 10 mg tid and is currently finishing a prednisone taper.  He continues to take the Flexeril and prednisone taper.  During the visit pain was radiating into his lower leg including lateral calf.  He reports that he thought he was getting better but then noticed irritation in his L lower when standing for a prolonged period of time.  He rates his current pain at a 4/10.  He is experiencing radiating pain into his L thigh which is an improvement from his initial injury.  He denies any sensory changes into his L LE.  He has tried ice and some stretching exercises he got at the urgent. Care.    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  His job requires lots of heavy pushing and pulling.  Past medical history, Surgical history, Family history not pertinant except as noted below, Social history, Allergies, and medications have been entered into the medical record, reviewed, and no changes needed.   Review of Systems: No headache, visual changes, nausea, vomiting, diarrhea, constipation, dizziness, abdominal pain, skin rash, fevers, chills, night sweats, weight loss, swollen lymph nodes, body aches, joint swelling, muscle aches, chest pain, shortness of breath, mood changes,  visual or auditory hallucinations.   Objective:    Vitals:   10/15/19 1055  BP: 102/70  Pulse: 91  SpO2: 95%   General: Well Developed, well nourished, and in no acute distress.  Neuro/Psych: Alert and oriented x3, extra-ocular muscles intact, able to move all 4 extremities, sensation grossly intact. Skin: Warm and dry, no rashes noted.  Respiratory: Not using accessory muscles, speaking in full sentences, trachea midline.  Cardiovascular: Pulses palpable, no extremity edema. Abdomen: Does not appear distended. MSK: L-spine: Nontender to spinal midline.  No step-offs palpated. Tender palpation left lumbar paraspinal musculature. Lumbar range of motion: Normal extension limited flexion.  Normal rotation and lateral flexion bilaterally. Lower extremity strength reflexes and sensation are equal normal throughout bilateral lower extremities. Mild antalgic gait. Positive left-sided slump test.  Lab and Radiology Results X-ray images L-spine obtained today personally and independently reviewed No significant DDD.  No malalignment.  Mild facet DJD L5-S1. Await formal radiology review  Impression and Recommendations:    Assessment and Plan: 43 y.o. male with left low back pain with lumbar radiculopathy.  Likely lumbosacral strain with some component of radiculopathy..  Patient had some initial response to prednisone muscle relaxers and naproxen.  However symptoms are continuing.  Plan to add gabapentin and refer to physical therapy.  Discussed return to work.  Return to work on Monday the 16th to light duty as outlined by paperwork filled out today included and scanned  documents.  Recheck back in about 2 weeks.  Return sooner if needed.  Precautions reviewed.  PDMP not reviewed this encounter. Orders Placed This Encounter  Procedures  . DG Lumbar Spine Complete    Standing Status:   Future    Number of Occurrences:   1    Standing Expiration Date:   12/14/2020    Order Specific  Question:   Reason for Exam (SYMPTOM  OR DIAGNOSIS REQUIRED)    Answer:   eval left low back pain and lumbar radicuopathy at L5    Order Specific Question:   Preferred imaging location?    Answer:   [facility]    Order Specific Question:   Radiology Contrast Protocol - do NOT remove file path    Answer:   [email or web address]  . Ambulatory referral to Physical Therapy    Referral Priority:   Routine    Referral Type:   Physical Medicine    Referral Reason:   Specialty Services Required    Requested Specialty:   Physical Therapy   Meds ordered this encounter  Medications  . gabapentin (NEURONTIN) 300 MG capsule    Sig: One tab PO qHS for a week, then BID for a week, then TID. May double weekly to a max of 3,600mg /day    Dispense:  180 capsule    Refill:  3    Discussed warning signs or symptoms. Please see discharge instructions. Patient expresses understanding.  The above documentation has been reviewed and is accurate and complete Clementeen Graham

## 2019-10-15 NOTE — Patient Instructions (Addendum)
Thank you for coming in today. Attend PT.  Recheck in 2 weeks.  Return sooner if needed.  OK to resume work with light duty per form starting 11/16 Come back or go to the emergency room if you notice new weakness new numbness problems walking or bowel or bladder problems.    Lumbosacral Strain Lumbosacral strain is an injury that causes pain in the lower back (lumbosacral spine). This injury usually occurs from overstretching the muscles or ligaments along your spine. A strain can affect one or more muscles or cord-like tissues that connect bones to other bones (ligaments). What are the causes? This condition may be caused by:  A hard, direct hit (blow) to the back.  Excessive stretching of the lower back muscles. This may result from: ? A fall. ? Lifting something heavy. ? Repetitive movements such as bending or crouching. What increases the risk? The following factors may increase your risk of getting this condition:  Participating in sports or activities that involve: ? A sudden twist of the back. ? Pushing or pulling motions.  Being overweight or obese.  Having poor strength and flexibility, especially tight hamstrings or weak muscles in the back or abdomen.  Having too much of a curve in the lower back.  Having a pelvis that is tilted forward. What are the signs or symptoms? The main symptom of this condition is pain in the lower back, at the site of the strain. Pain may extend (radiate) down one or both legs. How is this diagnosed? This condition is diagnosed based on:  Your symptoms.  Your medical history.  A physical exam. ? Your health care provider may push on certain areas of your back to determine the source of your pain. ? You may be asked to bend forward, backward, and side to side to assess the severity of your pain and your range of motion.  Imaging tests, such as: ? X-rays. ? MRI.  How is this treated? Treatment for this condition may  include:  Putting heat and cold on the affected area.  Medicines to help relieve pain and relax your muscles (muscle relaxants).  NSAIDs to help reduce swelling and discomfort. When your symptoms improve, it is important to gradually return to your normal routine as soon as possible to reduce pain, avoid stiffness, and avoid loss of muscle strength. Generally, symptoms should improve within 6 weeks of treatment. However, recovery time varies. Follow these instructions at home: Managing pain, stiffness, and swelling   If directed, put ice on the injured area during the first 24 hours after your strain. ? Put ice in a plastic bag. ? Place a towel between your skin and the bag. ? Leave the ice on for 20 minutes, 2-3 times a day.  If directed, put heat on the affected area as often as told by your health care provider. Use the heat source that your health care provider recommends, such as a moist heat pack or a heating pad. ? Place a towel between your skin and the heat source. ? Leave the heat on for 20-30 minutes. ? Remove the heat if your skin turns bright red. This is especially important if you are unable to feel pain, heat, or cold. You may have a greater risk of getting burned. Activity  Rest and return to your normal activities as told by your health care provider. Ask your health care provider what activities are safe for you.  Avoid activities that take a lot of energy for as  long as told by your health care provider. General instructions  Take over-the-counter and prescription medicines only as told by your health care provider.  Donot drive or use heavy machinery while taking prescription pain medicine.  Do not use any products that contain nicotine or tobacco, such as cigarettes and e-cigarettes. If you need help quitting, ask your health care provider.  Keep all follow-up visits as told by your health care provider. This is important. How is this prevented?  Use  correct form when playing sports and lifting heavy objects.  Use good posture when sitting and standing.  Maintain a healthy weight.  Sleep on a mattress with medium firmness to support your back.  Be safe and responsible while being active to avoid falls.  Do at least 150 minutes of moderate-intensity exercise each week, such as brisk walking or water aerobics. Try a form of exercise that takes stress off your back, such as swimming or stationary cycling.  Maintain physical fitness, including: ? Strength. ? Flexibility. ? Cardiovascular fitness. ? Endurance. Contact a health care provider if:  Your back pain does not improve after 6 weeks of treatment.  Your symptoms get worse. Get help right away if:  Your back pain is severe.  You cannot stand or walk.  You have difficulty controlling when you urinate or when you have a bowel movement.  You feel nauseous or you vomit.  Your feet get very cold.  You have numbness, tingling, weakness, or problems using your arms or legs.  You develop any of the following: ? Shortness of breath. ? Dizziness. ? Pain in your legs. ? Weakness in your buttocks or legs. ? Discoloration of the skin on your toes or legs. This information is not intended to replace advice given to you by your health care provider. Make sure you discuss any questions you have with your health care provider. Document Released: 09/01/2005 Document Revised: 03/16/2019 Document Reviewed: 04/25/2016 Elsevier Patient Education  2020 [employer].

## 2019-10-16 NOTE — Progress Notes (Signed)
Mild back arthritis present.

## 2019-10-29 ENCOUNTER — Ambulatory Visit (INDEPENDENT_AMBULATORY_CARE_PROVIDER_SITE_OTHER): Admitting: Family Medicine

## 2019-10-29 ENCOUNTER — Other Ambulatory Visit: Payer: Self-pay

## 2019-10-29 VITALS — HR 80 | Wt 248.0 lb

## 2019-10-29 DIAGNOSIS — M5416 Radiculopathy, lumbar region: Secondary | ICD-10-CM | POA: Diagnosis not present

## 2019-10-29 DIAGNOSIS — M5442 Lumbago with sciatica, left side: Secondary | ICD-10-CM

## 2019-10-29 MED ORDER — PREDNISONE 50 MG PO TABS: 50.0000 mg | ORAL_TABLET | Freq: Every day | ORAL | 0 refills | Status: DC

## 2019-10-29 MED ORDER — PREGABALIN 75 MG PO CAPS: 75.0000 mg | ORAL_CAPSULE | Freq: Two times a day (BID) | ORAL | 3 refills | Status: DC

## 2019-10-29 NOTE — Progress Notes (Signed)
Austin [name] is a 43 y.o. male who presents to [facility] today for follow-up of lumbar radiculopathy and lumbosacral strain.    Austin [name] was last in clinic on November 9.  At that time he had lumbar radiculopathy and lumbosacral strain.  At that point he had already been treated with a course of prednisone muscle relaxer and naproxen.  We added gabapentin and referred to physical therapy and plan to return to work on November 16 with light duty.  However his work held him out of work as there is no light duty to be done.  He has not been able to work during the duration of this pain episode.  Back pain started October 30.  In the interim he notes continued pain.  Pain located predominantly left low back.  He notes his radicular pain is less dominant.  States his pain is exacerbated with prolonged standing. Also complains of radicular symptoms doing down left leg.  Patient is using prednisone but is no longer using gabapentin as he felt moody with use.   ROS:  As above  Exam:  Pulse 80   Wt 248 lb (112.5 kg)   SpO2 95%   BMI 29.41 kg/m  Wt Readings from Last 5 Encounters:  10/29/19 248 lb (112.5 kg)  10/15/19 247 lb 9.6 oz (112.3 kg)  10/09/19 242 lb 3.2 oz (109.9 kg)  11/21/18 248 lb (112.5 kg)  11/06/18 246 lb (111.6 kg)   General: Well Developed, well nourished, and in no acute distress.  Neuro/Psych: Alert and oriented x3, extra-ocular muscles intact, able to move all 4 extremities, sensation grossly intact. Skin: Warm and dry, no rashes noted.  Respiratory: Not using accessory muscles, speaking in full sentences, trachea midline.  Cardiovascular: Pulses palpable, no extremity edema. Abdomen: Does not appear distended. MSK: L-spine: Nontender to spinal midline. Lumbar motion significantly limited to extension rotation lateral flexion and flexion. Lower extremity strength is intact. Mild antalgic gait.    Lab and Radiology Results Dg Lumbar Spine  Complete  Result Date: 10/15/2019 CLINICAL DATA:  Low back pain and lumbar radiculopathy at L5. EXAM: LUMBAR SPINE - COMPLETE 4+ VIEW COMPARISON:  None. FINDINGS: Five lumbar type vertebral bodies. Left greater than right sacroiliac joint sclerosis is most likely degenerative. Maintenance of vertebral body height and alignment. Intervertebral disc heights are maintained. Facet arthropathy involves L5-S1. IMPRESSION: Mild spondylosis.  No acute findings. Electronically Signed   By: Jeronimo Greaves M.D.   On: 10/15/2019 15:31   I, Austin [name], personally (independently) visualized and performed the interpretation of the images attached in this note.     Assessment and Plan: 43 y.o. male with left low back pain with mild radiculopathy.  Ongoing.  Plan to proceed with physical therapy patient has is first appointment scheduled for tomorrow.  Will stop gabapentin as he did not tolerate it at all.  We will try using Lyrica if needed.  Backup course of prednisone prescribed for use if worsening.  Discussed work situation.  Unable to work.  Given that we will check back in 2 weeks with planned visit.  If no better next step would likely be MRI for injection planning.   PDMP not reviewed this encounter. No orders of the defined types were placed in this encounter.  Meds ordered this encounter  Medications  . DISCONTD: pregabalin (LYRICA) 75 MG capsule    Sig: Take 1 capsule (75 mg total) by mouth 2 (two) times daily.    Dispense:  60 capsule    Refill:  3  . pregabalin (LYRICA) 75 MG capsule    Sig: Take 1 capsule (75 mg total) by mouth 2 (two) times daily.    Dispense:  60 capsule    Refill:  3  . predniSONE (DELTASONE) 50 MG tablet    Sig: Take 1 tablet (50 mg total) by mouth daily.    Dispense:  5 tablet    Refill:  0    Historical information moved to improve visibility of documentation.  Past Medical History:  Diagnosis Date  . HYPERLIPIDEMIA 02/18/2009  . HYPERTENSION 02/18/2009  . 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 Nephritis   . Pancytopenia [facility])    Past Surgical History:  Procedure Laterality Date  . RENAL BIOPSY  2003  . TONSILLECTOMY     Social History   Tobacco Use  . Smoking status: Never Smoker  . Smokeless tobacco: Never Used  Substance Use Topics  . Alcohol use: Yes    Comment: rare   family history includes Cancer in his maternal grandfather; Diabetes in his maternal grandmother; Hyperlipidemia in his father; Hypertension in his maternal grandfather and maternal grandmother.  Medications: Current Outpatient Medications  Medication Sig Dispense Refill  . acetaminophen (TYLENOL) 500 MG tablet Take 1,000 mg by mouth every 6 (six) hours as needed for moderate pain or headache.    . cyclobenzaprine (FLEXERIL) 10 MG tablet Take 1 tablet (10 mg total) by mouth 2 (two) times daily as needed for muscle spasms. 20 tablet 0  . enalapril (VASOTEC) 5 MG tablet Take 5 mg by mouth daily.    . hydroxychloroquine (PLAQUENIL) 200 MG tablet Take 2 tablets by mouth daily with food or milk.    . pantoprazole (PROTONIX) 40 MG tablet Take 1 tablet (40 mg total) by mouth 2 (two) times daily. 60 tablet 5  . predniSONE (DELTASONE) 50 MG tablet Take 1 tablet (50 mg total) by mouth daily. 5 tablet 0  . pregabalin (LYRICA) 75 MG capsule Take 1 capsule (75 mg total) by mouth 2 (two) times daily. 60 capsule 3   No current facility-administered medications for this visit.    No Known Allergies    Discussed warning signs or symptoms. Please see discharge instructions. Patient expresses understanding.  The above documentation has been reviewed and is accurate and complete Lynne Leader

## 2019-10-29 NOTE — Patient Instructions (Addendum)
Thank you for coming in today. Attend PT.  STOP gabapentin Try lyrica.  Use prednisone as a backup.   Check back in 2 weeks.  Attend PT.   Recheck sooner if needed.   Come back or go to the emergency room if you notice new weakness new numbness problems walking or bowel or bladder problems.

## 2019-10-30 ENCOUNTER — Ambulatory Visit: Payer: Federal, State, Local not specified - PPO | Attending: Family Medicine | Admitting: Physical Therapy

## 2019-10-30 ENCOUNTER — Encounter: Payer: Self-pay | Admitting: Physical Therapy

## 2019-10-30 DIAGNOSIS — M5442 Lumbago with sciatica, left side: Secondary | ICD-10-CM | POA: Diagnosis not present

## 2019-10-30 DIAGNOSIS — M6281 Muscle weakness (generalized): Secondary | ICD-10-CM | POA: Diagnosis not present

## 2019-10-30 DIAGNOSIS — R262 Difficulty in walking, not elsewhere classified: Secondary | ICD-10-CM | POA: Diagnosis not present

## 2019-10-30 NOTE — Therapy (Signed)
[place], [place], [address] Phone: [phone]   Fax:  [phone]  Physical Therapy Evaluation  Patient Details  Name: Austin [name] MRN: [id number] Date of Birth: [birth year] Referring Provider (PT): Lynne Leader MD   Encounter Date: 10/30/2019  PT End of Session - 10/30/19 1719    Visit Number  1    Number of Visits  13    Date for PT Re-Evaluation  12/21/19    PT Start Time  2025    PT Stop Time  1702    PT Time Calculation (min)  47 min    Activity Tolerance  Patient tolerated treatment well    Behavior During Therapy  [facility] for tasks assessed/performed       Past Medical History:  Diagnosis Date  . HYPERLIPIDEMIA 02/18/2009  . HYPERTENSION 02/18/2009  . 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 Nephritis   . Pancytopenia [facility])     Past Surgical History:  Procedure Laterality Date  . RENAL BIOPSY  2003  . TONSILLECTOMY      There were no vitals filed for this visit.   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 Pt reporting on Halloween he went to lift a bag from the ground he had a "shooting pain" down his low back and he couldn't streighten up. He reported going to the ground with pain. Pt reporting when incident occurred he layed on the ground for about 15-20 minutes hoping the pain would go away. He reported finishing his shift.    Pertinent History  h/o HTN, Lupus, lumbago, anemia, 1 year ago had injection in R shoulder.    Limitations  Walking;Standing    How long can you sit comfortably?  sitting is the most comfortable    How long can you stand comfortably?  unsure    How long can you walk comfortably?  a little over an hour    Patient Stated Goals  Work without pain    Currently in Pain?  Yes    Pain Score  4     Pain Location   Back    Pain Orientation  Left    Pain Descriptors / Indicators  Aching    Pain Radiating Towards  down left lateral thight and lower leg    Pain Onset  1 to 4 weeks ago    Pain Frequency  Intermittent    Aggravating Factors   standing, walking    Pain Relieving Factors  changing positions, sitting         [facility] PT Assessment - 10/30/19 0001      Assessment   Medical Diagnosis  Left sided LBP with Sciatica    Referring Provider (PT)  Lynne Leader MD    Onset Date/Surgical Date  09/06/19   pain worsened on 10/06/2019    Hand Dominance  Right    Prior Therapy  no      Precautions   Precautions  None      Restrictions   Weight Bearing Restrictions  No      Balance Screen   Has the patient fallen in the past 6 months  Yes    How many times?  3   on 10/06/2019 after incident due to increased pain   Is the patient reluctant to leave their  home because of a fear of falling?   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    Leisure  travel and eating      Cognition   Overall Cognitive Status  Within Functional Limits for tasks assessed      Observation/Other Assessments   Focus on Therapeutic Outcomes (FOTO)   workers comp      Posture/Postural Control   Posture/Postural Control  Postural limitations    Postural Limitations  Rounded Shoulders;Forward head;Decreased lumbar lordosis      ROM / Strength   AROM / PROM / Strength  AROM;Strength      AROM   AROM Assessment Site  Lumbar    Lumbar Flexion  48    Lumbar Extension  12    Lumbar - Right Side  Bend  24    Lumbar - Left Side Bend  21    Lumbar - Right Rotation  limited 25% with increasd pain down left side of low back    Lumbar - Left Rotation  WFL pain reported      Strength   Overall Strength  Deficits    Overall Strength Comments  RLE grossly 5/5    Strength Assessment Site  Hip;Knee    Right/Left Hip  Left    Left Hip Flexion  4-/5   pain increased    Left Hip Extension  4-/5   increased pain   Left Hip ABduction  4-/5   increased pain   Left Hip ADduction  4-/5   increased pain   Right/Left Knee  Left    Left Knee Flexion  4/5    Left Knee Extension  4/5      Flexibility   Soft Tissue Assessment /Muscle Length  yes    Hamstrings  R: 64, L: 55 degrees      Palpation   Palpation comment  TTP L lumbar paraspinals, proximal hamstrings, IT band, piriformis, and SI joint.       Special Tests    Special Tests  Lumbar    Lumbar Tests  Slump Test;Straight Leg Raise      Slump test   Findings  Positive    Side  Left      Straight Leg Raise   Findings  Positive    Side   Left      Transfers   Five time sit to stand comments   47.8 seconds using UE support      Ambulation/Gait   Gait Pattern  Step-through pattern;Antalgic      Balance   Balance Assessed  Yes      High Level Balance   High Level Balance Comments  SLS R: 15 seconds, L: 3 seconds                Objective measurements completed on examination: See above findings.  PT Short Term Goals - 10/30/19 1719      PT SHORT TERM GOAL #1   Title  Pt will be independent in her HEP.    Time  3    Period  Weeks    Status  New    Target Date  11/20/19        PT Long Term Goals - 10/30/19 1720      PT LONG TERM GOAL #1   Title  Pt will be able to improve his L LE strength to grossly 5/5 in order to improve his functional mobility and gait.    Baseline  -4/5 grossly    Time  6    Period  Weeks    Status  New    Target Date  12/11/19      PT LONG TERM GOAL  #2   Title  Pt will be able to improve his 5 time sit to stand in under 15 seconds.    Baseline  37.8 seconds on 10/30/2019    Time  6    Period  Weeks    Status  New    Target Date  12/11/19      PT LONG TERM GOAL #3   Title  pt will be able bend and lift 20# object off the floor demonstrating correct body mechanics with pain </=2/10.    Baseline  unable to bend and lift    Time  6    Period  Weeks    Status  New    Target Date  12/11/19      PT LONG TERM GOAL #4   Title  Pt will be able to amb for >/= 20 minutes with no pain reported.    Baseline  pain can vary    Time  6    Period  Weeks    Status  New    Target Date  12/11/19      PT LONG TERM GOAL #5   Title  Pt will improve his bilateral hamstring flexibliity to >/= 70 degrees to improve mobility.    Baseline  see flow sheets    Time  6    Period  Weeks    Status  New    Target Date  12/11/19             Plan - 10/30/19 1736    Clinical Impression Statement  Pt arriving to therapy reporting month long history of LBP with increased on 10/06/2019 while lifting a package from the ground at work. Pt reporting falling due to shooting pain down his left low back and side. Pt arriving today reporting 5/10 pain in his left low back reporting radiation down lateral thigh and lower leg which is intermittent. Pt reports having diffculty with standing long periods, transitioning from sit to stand, and walking longer distances. Pt presenting with postiive SLR and slump test on the left. Pt issued gentle stretching for HEP. Skilled PT needed to continue to address pt's impairments with the below interventions for pt to return to this PLOF.    Personal Factors and Comorbidities  Comorbidity 2    Comorbidities  HTN, lupus, anemia, h/o R shoulder pain, h/o lumbago    Examination-Activity Limitations  Lift;Squat;Stairs;Stand;Bend;Transfers    Examination-Participation Restrictions  Community Activity;Driving;Yard Work;Other     Stability/Clinical Decision Making  Stable/Uncomplicated    Clinical Decision Making  Low    Rehab Potential  Good    PT Frequency  2x / week  PT Duration  6 weeks    PT Treatment/Interventions  ADLs/Self Care Home Management;Cryotherapy;Electrical Stimulation;Moist Heat;Traction;Ultrasound;Therapeutic activities;Functional mobility training;Stair training;Gait training;Therapeutic exercise;Balance training;Neuromuscular re-education;Patient/family education;Manual techniques;Passive range of motion;Dry needling;Taping    PT Next Visit Plan  lumbar stretching, LE stretching, core stabilization, STM to lumbar paraspinals, modalities as needed    PT Home Exercise Plan  access code: [email or web address] (hamstring stretch, PPT, supine marching, trunk rotation in hook lying)    Consulted and Agree with Plan of Care  Patient       Patient will benefit from skilled therapeutic intervention in order to improve the following deficits and impairments:  Pain, Postural dysfunction, Impaired flexibility, Decreased strength, Decreased activity tolerance, Decreased range of motion, Difficulty walking, Decreased balance  Visit Diagnosis: Acute left-sided low back pain with left-sided sciatica  Muscle weakness (generalized)  Difficulty in walking, not elsewhere classified     Problem List Patient Active Problem List   Diagnosis Date Noted  . On prednisone therapy 01/28/2014  . Lupus (systemic lupus erythematosus) (HCC) 10/02/2013  . Hyponatremia 10/02/2013  . Acute renal failure (HCC) 09/30/2013  . Other pancytopenia (HCC) 09/29/2013  . Anemia of chronic disease 09/29/2013  . Joint pain 09/28/2013  . 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 LUMBAGO 07/08/2009  . HYPERTENSION 02/18/2009  . HIP PAIN, LEFT, CHRONIC 02/18/2009    Sharmon Leyden, PT 10/30/2019, 5:46 PM  [facility] [address] [place], [place], [address] Phone: [phone]   Fax:  [phone]  Name: Austin [name] MRN: [id number] Date of Birth: [birth year]

## 2019-11-13 ENCOUNTER — Ambulatory Visit (INDEPENDENT_AMBULATORY_CARE_PROVIDER_SITE_OTHER): Payer: Federal, State, Local not specified - PPO | Admitting: Family Medicine

## 2019-11-13 ENCOUNTER — Other Ambulatory Visit: Payer: Self-pay

## 2019-11-13 ENCOUNTER — Encounter: Payer: Self-pay | Admitting: Family Medicine

## 2019-11-13 VITALS — BP 112/72 | HR 84 | Ht 77.0 in | Wt 249.2 lb

## 2019-11-13 DIAGNOSIS — M5442 Lumbago with sciatica, left side: Secondary | ICD-10-CM

## 2019-11-13 DIAGNOSIS — M5416 Radiculopathy, lumbar region: Secondary | ICD-10-CM | POA: Diagnosis not present

## 2019-11-13 NOTE — Progress Notes (Signed)
I, Austin [name], LAT, ATC, am serving as scribe for Dr. Lynne Leader.  Austin [name] is a 43 y.o. male who presents to [facility] [place] today for f/u of low back pain w/ L LE radicular pain.  He was last seen by Dr. Georgina Snell on 10/29/19.  Pt was referred to PT but has only attended one visit.  He has twice weekly physical therapy scheduled starting tomorrow for 6 weeks.  He is taking Prednisone 50 mg qd and Lyrica 75 mg bid.  Since his last visit, pt notes that has his symptoms are "so-so" but does note improvement in his symptoms since his first visit.  Pt rates his improvement at 50-60%.  Pt reports L-sided lower back pain currently but does not report L leg pain currently.  Pt states when he does have L leg pain, he notes pain in his L calf and also reports numbness/tingling into his L toes.    Overall his symptoms are significant and bothersome.  He cannot complete normal housework because of pain.  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   ROS:  As above  Exam:  BP 112/72 (BP Location: Left Arm, Patient Position: Sitting, Cuff Size: Large)   Pulse 84   Ht 6\' 5"  (1.956 m)   Wt 249 lb 3.2 oz (113 kg)   SpO2 95%   BMI 29.55 kg/m  Wt Readings from Last 5 Encounters:  11/13/19 249 lb 3.2 oz (113 kg)  10/29/19 248 lb (112.5 kg)  10/15/19 247 lb 9.6 oz (112.3 kg)  10/09/19 242 lb 3.2 oz (109.9 kg)  11/21/18 248 lb (112.5 kg)   General: Well Developed, well nourished, and in no acute distress.  Neuro/Psych: Alert and oriented x3, extra-ocular muscles intact, able to move all 4 extremities, sensation grossly intact. Skin: Warm and dry, no rashes noted.  Respiratory: Not using accessory muscles, speaking in full sentences, trachea midline.  Cardiovascular: Pulses palpable, no extremity edema. Abdomen: Does not appear distended. MSK: L-spine: Nontender to spinal midline.  Tender palpation bilateral  lumbar paraspinal musculature. Range of motion: Limited rotation lateral flexion flexion and extension by pain. Lower extremity strength is intact bilateral lower extremities.  Reflexes are equal normal throughout.  Sensation is intact. Mildly positive left-sided slump test.    Lab and Radiology Results Dg Lumbar Spine Complete  Result Date: 10/15/2019 CLINICAL DATA:  Low back pain and lumbar radiculopathy at L5. EXAM: LUMBAR SPINE - COMPLETE 4+ VIEW COMPARISON:  None. FINDINGS: Five lumbar type vertebral bodies. Left greater than right sacroiliac joint sclerosis is most likely degenerative. Maintenance of vertebral body height and alignment. Intervertebral disc heights are maintained. Facet arthropathy involves L5-S1. IMPRESSION: Mild spondylosis.  No acute findings. Electronically Signed   By: Abigail Miyamoto M.D.   On: 10/15/2019 15:31   I, Lynne Leader, personally (independently) visualized and performed the interpretation of the images attached in this note.     Assessment and Plan: 43 y.o. male with low back pain with left-sided lumbar radiculopathy.  Symptoms ongoing now for approximately 5 weeks and failing initial trials of conservative management.  Currently mildly improved with Lyrica and prednisone burst.  Fundamentally we have had a hard time getting him scheduled for physical therapy and has only had one session.  Fortunately he does have multiple scheduled physical therapy sessions starting tomorrow twice weekly for 6 weeks.  This should  be significantly helpful.  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  From my perspective he is able to return to work with light duty if there is light duty to be done at his job.  Plan on rechecking in about 2 weeks.  At that time if not improved with physical therapy we may consider MRI of L-spine for epidural steroid injection planning.  We will continue to complete notes and forms as needed.     Historical information moved to improve visibility of documentation.  Past Medical History:  Diagnosis Date  . HYPERLIPIDEMIA 02/18/2009  . HYPERTENSION 02/18/2009  . Lupus glomerulonephritis (HCC)   . Nephritis   . Pancytopenia [facility])    Past Surgical History:  Procedure Laterality Date  . RENAL BIOPSY  2003  . TONSILLECTOMY     Social History   Tobacco Use  . Smoking status: Never Smoker  . Smokeless tobacco: Never Used  Substance Use Topics  . Alcohol use: Yes    Comment: rare   family history includes Cancer in his maternal grandfather; Diabetes in his maternal grandmother; Hyperlipidemia in his father; Hypertension in his maternal grandfather and maternal grandmother.  Medications: Current Outpatient Medications  Medication Sig Dispense Refill  . acetaminophen (TYLENOL) 500 MG tablet Take 1,000 mg by mouth every 6 (six) hours as needed for moderate pain or headache.    . enalapril (VASOTEC) 5 MG tablet Take 5 mg by mouth daily.    . hydroxychloroquine (PLAQUENIL) 200 MG tablet Take 2 tablets by mouth daily with food or milk.    . pantoprazole (PROTONIX) 40 MG tablet Take 1 tablet (40 mg total) by mouth 2 (two) times daily. 60 tablet 5  . pregabalin (LYRICA) 75 MG capsule Take 1 capsule (75 mg total) by mouth 2 (two) times daily. 60 capsule 3  . predniSONE (DELTASONE) 50 MG tablet Take 1 tablet (50 mg total) by mouth daily. (Patient not taking: Reported on 10/30/2019) 5 tablet 0   No current facility-administered medications for this visit.    No Known Allergies    Discussed warning signs or symptoms. Please see discharge instructions. Patient expresses understanding.  The above documentation has been reviewed and is accurate and complete Clementeen Graham

## 2019-11-13 NOTE — Patient Instructions (Signed)
Thank you for coming in today. Schedule with me in about 2 weeks.  Return sooner if needed.   We're moving!  Dr. Clovis Riley new office will be located at [address] on the 1st floor.  This location is across the street from the [employer] and in the same complex as the [facility] and [employer]  Our new office phone number will be [phone].  We anticipate beginning to see patients at the [facility] office in early December 2020.

## 2019-11-14 ENCOUNTER — Encounter: Payer: Self-pay | Admitting: Physical Therapy

## 2019-11-14 ENCOUNTER — Ambulatory Visit: Payer: Federal, State, Local not specified - PPO | Attending: Family Medicine | Admitting: Physical Therapy

## 2019-11-14 DIAGNOSIS — M5442 Lumbago with sciatica, left side: Secondary | ICD-10-CM | POA: Insufficient documentation

## 2019-11-14 DIAGNOSIS — M6281 Muscle weakness (generalized): Secondary | ICD-10-CM | POA: Diagnosis not present

## 2019-11-14 DIAGNOSIS — R262 Difficulty in walking, not elsewhere classified: Secondary | ICD-10-CM | POA: Insufficient documentation

## 2019-11-14 NOTE — Therapy (Signed)
Austin [name], [place], [address] Phone: [phone]   Fax:  [phone]  Physical Therapy Treatment  Patient Details  Name: Austin [name] MRN: [id number] Date of Birth: [birth year] Referring Provider (PT): Lynne Leader MD   Encounter Date: 11/14/2019  PT End of Session - 11/14/19 1426    Visit Number  2    Number of Visits  13    Date for PT Re-Evaluation  12/21/19    PT Start Time  0626    PT Stop Time  1059    PT Time Calculation (min)  44 min    Activity Tolerance  Patient tolerated treatment well    Behavior During Therapy  [facility] for tasks assessed/performed       Past Medical History:  Diagnosis Date  . HYPERLIPIDEMIA 02/18/2009  . HYPERTENSION 02/18/2009  . Lupus glomerulonephritis ([place])   . Nephritis   . Pancytopenia [facility])     Past Surgical History:  Procedure Laterality Date  . RENAL BIOPSY  2003  . TONSILLECTOMY      There were no vitals filed for this visit.  Subjective Assessment - 11/14/19 1345    Subjective  Patient feels like his back is a little better but yesterday he had radicular pain down his leg. He is not having any radicular pain right now just in the middle of his back. He has been working on his stretches at home.    Pertinent History  h/o HTN, Lupus, lumbago, anemia, 1 year ago had injection in R shoulder.    Limitations  Walking;Standing    How long can you sit comfortably?  sitting is the most comfortable    How long can you stand comfortably?  unsure    How long can you walk comfortably?  a little over an hour    Patient Stated Goals  Work without pain    Currently in Pain?  Yes    Pain Score  4     Pain Location  Back    Pain Orientation  Left    Pain Descriptors / Indicators  Aching    Pain Type  Chronic pain    Pain Radiating Towards  left lateral thigh and lateral lower leg    Pain Onset  1 to 4 weeks ago    Pain Frequency  Constant    Aggravating Factors    standing and walking    Pain Relieving Factors  rest    Multiple Pain Sites  No                       [facility] Adult PT Treatment/Exercise - 11/14/19 0001      Lumbar Exercises: Stretches   Piriformis Stretch Limitations  3x20 sec hold     Other Lumbar Stretch Exercise  reviewed self trigger point release with tennis ball       Lumbar Exercises: Seated   Other Seated Lumbar Exercises  reviewed abdominal breathing and PPT. Reviewed sitting with good posture.       Lumbar Exercises: Prone   Other Prone Lumbar Exercises  reviewed prone on elbows and prone positioning       Manual Therapy   Manual Therapy  Soft tissue mobilization;Joint mobilization;Manual Traction    Joint Mobilization  Side lying spinal mobilization     Soft tissue mobilization  IASTYM to lumbar paraspinals     Manual Traction  LAD grade II and III ocilations to both legs;  Trigger Point Dry Needling - 11/14/19 0001    Consent Given?  Yes    Education Handout Provided  Yes    Muscles Treated Back/Hip  Lumbar multifidi    Dry Needling Comments  2 needels    Lumbar multifidi Response  Twitch response elicited;Palpable increased muscle length           PT Education - 11/14/19 1352    Education Details  updated HEP; benefits and risks of TPDN    Person(s) Educated  Patient    Methods  Explanation;Demonstration;Tactile cues;Verbal cues    Comprehension  Verbalized understanding;Verbal cues required;Returned demonstration;Tactile cues required       PT Short Term Goals - 11/14/19 1442      PT SHORT TERM GOAL #1   Title  Pt will be independent in her HEP.    Baseline  working on base HEP    Time  3    Period  Weeks    Status  On-going    Target Date  11/20/19        PT Long Term Goals - 10/30/19 1720      PT LONG TERM GOAL #1   Title  Pt will be able to improve his L LE strength to grossly 5/5 in order to improve his functional mobility and gait.    Baseline  -4/5 grossly     Time  6    Period  Weeks    Status  New    Target Date  12/11/19      PT LONG TERM GOAL #2   Title  Pt will be able to improve his 5 time sit to stand in under 15 seconds.    Baseline  37.8 seconds on 10/30/2019    Time  6    Period  Weeks    Status  New    Target Date  12/11/19      PT LONG TERM GOAL #3   Title  pt will be able bend and lift 20# object off the floor demonstrating correct body mechanics with pain </=2/10.    Baseline  unable to bend and lift    Time  6    Period  Weeks    Status  New    Target Date  12/11/19      PT LONG TERM GOAL #4   Title  Pt will be able to amb for >/= 20 minutes with no pain reported.    Baseline  pain can vary    Time  6    Period  Weeks    Status  New    Target Date  12/11/19      PT LONG TERM GOAL #5   Title  Pt will improve his bilateral hamstring flexibliity to >/= 70 degrees to improve mobility.    Baseline  see flow sheets    Time  6    Period  Weeks    Status  New    Target Date  12/11/19            Plan - 11/14/19 1435    Clinical Impression Statement  Therapy focused on manual therapy to rece inflammation and lumbar parapsinal spasming. Pateint tolerated dry needling well. therapy did 2 needles in his parapsinals with a good twitch respose to eahc. He reported improved pain with decompression exercises. he was given a pirifromis stretch and tennis ball for home. Therapy also reviewed prone positioning. He was advised to try prone on elbows if he is  having pain into his leg. If the pain cerntralies we will begin with a mckenzie progression.    Personal Factors and Comorbidities  Comorbidity 2    Comorbidities  HTN, lupus, anemia, h/o R shoulder pain, h/o lumbago    Examination-Activity Limitations  Lift;Squat;Stairs;Stand;Bend;Transfers    Examination-Participation Restrictions  Community Activity;Driving;Yard Work;Other    Stability/Clinical Decision Making  Stable/Uncomplicated    Clinical Decision Making  Low     Rehab Potential  Good    PT Frequency  2x / week    PT Duration  6 weeks    PT Treatment/Interventions  ADLs/Self Care Home Management;Cryotherapy;Electrical Stimulation;Moist Heat;Traction;Ultrasound;Therapeutic activities;Functional mobility training;Stair training;Gait training;Therapeutic exercise;Balance training;Neuromuscular re-education;Patient/family education;Manual techniques;Passive range of motion;Dry needling;Taping    PT Next Visit Plan  lumbar stretching, LE stretching, core stabilization, STM to lumbar paraspinals, modalities as needed    PT Home Exercise Plan  access code: [email or web address] (hamstring stretch, PPT, supine marching, trunk rotation in hook lying)    Consulted and Agree with Plan of Care  Patient       Patient will benefit from skilled therapeutic intervention in order to improve the following deficits and impairments:  Pain, Postural dysfunction, Impaired flexibility, Decreased strength, Decreased activity tolerance, Decreased range of motion, Difficulty walking, Decreased balance  Visit Diagnosis: Acute left-sided low back pain with left-sided sciatica  Muscle weakness (generalized)  Difficulty in walking, not elsewhere classified     Problem List Patient Active Problem List   Diagnosis Date Noted  . On prednisone therapy 01/28/2014  . Lupus (systemic lupus erythematosus) (HCC) 10/02/2013  . Hyponatremia 10/02/2013  . Acute renal failure (HCC) 09/30/2013  . Other pancytopenia (HCC) 09/29/2013  . Anemia of chronic disease 09/29/2013  . Joint pain 09/28/2013  . 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 LUMBAGO 07/08/2009  . HYPERTENSION 02/18/2009  . HIP PAIN, LEFT, CHRONIC 02/18/2009    Dessie Comaavid J Alleyne Lac PT DPT  11/14/2019, 2:44 PM  [facility] [address] [place], [place], [address] Phone:  [phone]   Fax:  [phone]  Name: Laray AngerDavid L Bowker MRN: [id number] Date of Birth: 09/02/1976

## 2019-11-15 ENCOUNTER — Encounter: Payer: Self-pay | Admitting: Family Medicine

## 2019-11-16 ENCOUNTER — Encounter: Payer: Self-pay | Admitting: Physical Therapy

## 2019-11-16 ENCOUNTER — Other Ambulatory Visit: Payer: Self-pay

## 2019-11-16 ENCOUNTER — Ambulatory Visit: Payer: Federal, State, Local not specified - PPO | Admitting: Physical Therapy

## 2019-11-16 ENCOUNTER — Encounter: Payer: Self-pay | Admitting: Family Medicine

## 2019-11-16 DIAGNOSIS — M6281 Muscle weakness (generalized): Secondary | ICD-10-CM | POA: Diagnosis not present

## 2019-11-16 DIAGNOSIS — R262 Difficulty in walking, not elsewhere classified: Secondary | ICD-10-CM | POA: Diagnosis not present

## 2019-11-16 DIAGNOSIS — M5442 Lumbago with sciatica, left side: Secondary | ICD-10-CM

## 2019-11-16 NOTE — Therapy (Signed)
[facility] [address] [place], [place], [address] Phone: [phone]   Fax:  [phone]  Physical Therapy Treatment  Patient Details  Name: Austin [name] MRN: [id number] Date of Birth: Jul 24, 1976 Referring Provider (PT): Clementeen Graham MD   Encounter Date: 11/16/2019  PT End of Session - 11/16/19 0914    Visit Number  3    Number of Visits  13    Date for PT Re-Evaluation  12/21/19    PT Start Time  0845    PT Stop Time  0930    PT Time Calculation (min)  45 min    Activity Tolerance  Patient tolerated treatment well    Behavior During Therapy  [facility] for tasks assessed/performed       Past Medical History:  Diagnosis Date  . HYPERLIPIDEMIA 02/18/2009  . HYPERTENSION 02/18/2009  . Lupus glomerulonephritis (HCC)   . Nephritis   . Pancytopenia [facility])     Past Surgical History:  Procedure Laterality Date  . RENAL BIOPSY  2003  . TONSILLECTOMY      There were no vitals filed for this visit.  Subjective Assessment - 11/16/19 0851    Subjective  Patient feels like his back is imprving. He feels like the needling has helepd. He had a little pain going down his back this morning.    Pertinent History  h/o HTN, Lupus, lumbago, anemia, 1 year ago had injection in R shoulder.    Limitations  Walking;Standing    How long can you sit comfortably?  sitting is the most comfortable    How long can you stand comfortably?  unsure    How long can you walk comfortably?  a little over an hour    Patient Stated Goals  Work without pain    Currently in Pain?  No/denies    Pain Score  6     Pain Location  Back    Pain Orientation  Left    Pain Descriptors / Indicators  Aching    Pain Type  Chronic pain    Pain Onset  1 to 4 weeks ago    Pain Frequency  Constant    Aggravating Factors   standing and walking    Pain Relieving Factors  rest                       [facility] Adult PT Treatment/Exercise - 11/16/19 0001       Lumbar Exercises: Stretches   Passive Hamstring Stretch  2 reps;30 seconds    Passive Hamstring Stretch Limitations  some increased pain on the left     Piriformis Stretch Limitations  3x20 sec hold       Lumbar Exercises: Supine   AB Set Limitations  reviewed abdominal breathing     Bent Knee Raise Limitations  1x10 reviewed potential porgresion for the future     Bridge Limitations  x10       Lumbar Exercises: Prone   Other Prone Lumbar Exercises  talked to patient about prone positioning if he has radicular pain       Manual Therapy   Manual Therapy  Soft tissue mobilization;Joint mobilization;Manual Traction    Joint Mobilization  Prone PA glides from L2-L5     Soft tissue mobilization  IASTYM to lumbar paraspinals     Manual Traction  LAD grade II and III ocilations to both legs;        Trigger Point Dry Needling -  11/16/19 0001    Consent Given?  Yes    Education Handout Provided  Yes    Muscles Treated Back/Hip  Gluteus medius    Gluteus Medius Response  Twitch response elicited    Lumbar multifidi Response  Twitch response elicited           PT Education - 11/16/19 0913    Education Details  reviewed HEp and symptom mangement    Person(s) Educated  Patient    Methods  Explanation;Demonstration;Tactile cues;Verbal cues    Comprehension  Verbalized understanding;Returned demonstration;Verbal cues required;Tactile cues required       PT Short Term Goals - 11/16/19 1241      PT SHORT TERM GOAL #1   Title  Pt will be independent in her HEP.    Baseline  working on base HEP    Time  3    Period  Weeks    Status  On-going    Target Date  11/20/19        PT Long Term Goals - 10/30/19 1720      PT LONG TERM GOAL #1   Title  Pt will be able to improve his L LE strength to grossly 5/5 in order to improve his functional mobility and gait.    Baseline  -4/5 grossly    Time  6    Period  Weeks    Status  New    Target Date  12/11/19      PT LONG TERM GOAL  #2   Title  Pt will be able to improve his 5 time sit to stand in under 15 seconds.    Baseline  37.8 seconds on 10/30/2019    Time  6    Period  Weeks    Status  New    Target Date  12/11/19      PT LONG TERM GOAL #3   Title  pt will be able bend and lift 20# object off the floor demonstrating correct body mechanics with pain </=2/10.    Baseline  unable to bend and lift    Time  6    Period  Weeks    Status  New    Target Date  12/11/19      PT LONG TERM GOAL #4   Title  Pt will be able to amb for >/= 20 minutes with no pain reported.    Baseline  pain can vary    Time  6    Period  Weeks    Status  New    Target Date  12/11/19      PT LONG TERM GOAL #5   Title  Pt will improve his bilateral hamstring flexibliity to >/= 70 degrees to improve mobility.    Baseline  see flow sheets    Time  6    Period  Weeks    Status  New    Target Date  12/11/19            Plan - 11/16/19 1233    Clinical Impression Statement  Patient tolerated treatment well. His spasming is improved compared to the last visit. He still has some positional pain and tenderness to palpation. He was advised to continue his stretching and strengthneing at home. He was given some things he can do sitting today. He had a minor increase in pain with left hamstring stretch.    Personal Factors and Comorbidities  Comorbidity 2    Comorbidities  HTN, lupus, anemia, h/o  R shoulder pain, h/o lumbago    Examination-Activity Limitations  Lift;Squat;Stairs;Stand;Bend;Transfers    Examination-Participation Restrictions  Community Activity;Driving;Yard Work;Other    Stability/Clinical Decision Making  Stable/Uncomplicated    Clinical Decision Making  Low    Rehab Potential  Good    PT Frequency  2x / week    PT Duration  6 weeks    PT Treatment/Interventions  ADLs/Self Care Home Management;Cryotherapy;Electrical Stimulation;Moist Heat;Traction;Ultrasound;Therapeutic activities;Functional mobility training;Stair  training;Gait training;Therapeutic exercise;Balance training;Neuromuscular re-education;Patient/family education;Manual techniques;Passive range of motion;Dry needling;Taping    PT Next Visit Plan  lumbar stretching, LE stretching, core stabilization, STM to lumbar paraspinals, modalities as needed    PT Home Exercise Plan  access code: [email or web address] (hamstring stretch, PPT, supine marching, trunk rotation in hook lying)    Consulted and Agree with Plan of Care  Patient       Patient will benefit from skilled therapeutic intervention in order to improve the following deficits and impairments:  Pain, Postural dysfunction, Impaired flexibility, Decreased strength, Decreased activity tolerance, Decreased range of motion, Difficulty walking, Decreased balance  Visit Diagnosis: Acute left-sided low back pain with left-sided sciatica  Muscle weakness (generalized)  Difficulty in walking, not elsewhere classified     Problem List Patient Active Problem List   Diagnosis Date Noted  . On prednisone therapy 01/28/2014  . Lupus (systemic lupus erythematosus) ([place]) 10/02/2013  . Hyponatremia 10/02/2013  . Acute renal failure ([place]) 09/30/2013  . Other pancytopenia ([place]) 09/29/2013  . Anemia of chronic disease 09/29/2013  . Joint pain 09/28/2013  . 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 LUMBAGO 07/08/2009  . HYPERTENSION 02/18/2009  . HIP PAIN, LEFT, CHRONIC 02/18/2009    Carney Living PT DPT  11/16/2019, 12:43 PM  [facility] [address] [place], [place], [address] Phone: [phone]   Fax:  [phone]  Name: Austin [name] MRN: [id number] Date of Birth: Mar 23, 1976

## 2019-11-19 ENCOUNTER — Encounter: Payer: Self-pay | Admitting: Physical Therapy

## 2019-11-19 ENCOUNTER — Ambulatory Visit: Payer: Federal, State, Local not specified - PPO | Admitting: Physical Therapy

## 2019-11-19 ENCOUNTER — Other Ambulatory Visit: Payer: Self-pay

## 2019-11-19 DIAGNOSIS — M5442 Lumbago with sciatica, left side: Secondary | ICD-10-CM

## 2019-11-19 DIAGNOSIS — R262 Difficulty in walking, not elsewhere classified: Secondary | ICD-10-CM | POA: Diagnosis not present

## 2019-11-19 DIAGNOSIS — M6281 Muscle weakness (generalized): Secondary | ICD-10-CM | POA: Diagnosis not present

## 2019-11-19 NOTE — Therapy (Signed)
[place], [place], [address] Phone: [phone]   Fax:  [phone]  Physical Therapy Treatment  Patient Details  Name: Austin [name] MRN: [id number] Date of Birth: Jan 12, 1976 Referring Provider (PT): Lynne Leader MD   Encounter Date: 11/19/2019  PT End of Session - 11/19/19 1104    Visit Number  4    Number of Visits  13    Date for PT Re-Evaluation  12/21/19    PT Start Time  3419    PT Stop Time  1058    PT Time Calculation (min)  43 min    Activity Tolerance  Patient tolerated treatment well    Behavior During Therapy  [facility] for tasks assessed/performed       Past Medical History:  Diagnosis Date  . HYPERLIPIDEMIA 02/18/2009  . HYPERTENSION 02/18/2009  . 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 Nephritis   . Pancytopenia [facility])     Past Surgical History:  Procedure Laterality Date  . RENAL BIOPSY  2003  . TONSILLECTOMY      There were no vitals filed for this visit.  Subjective Assessment - 11/19/19 1021    Subjective  Patient has returned to work light duty. he is feeling stiff this morning. He has been doing a lot of standing.    Pertinent History  h/o HTN, Lupus, lumbago, anemia, 1 year ago had injection in R shoulder.    Limitations  Walking;Standing    How long can you sit comfortably?  sitting is the most comfortable    How long can you stand comfortably?  unsure    How long can you walk comfortably?  a little over an hour    Patient Stated Goals  Work without pain    Currently in Pain?  Yes    Pain Score  3     Pain Location  Back    Pain Orientation  Left    Pain Descriptors / Indicators  Aching    Pain Type  Chronic pain    Pain Onset  1 to 4 weeks ago    Pain Frequency  Constant    Aggravating Factors   standing and walking    Pain Relieving Factors  rest    Effect of Pain on Daily Activities  reviewed HEP and symptom management                       [facility]  Adult PT Treatment/Exercise - 11/19/19 0001      Lumbar Exercises: Stretches   Piriformis Stretch Limitations  3x20 sec hold       Lumbar Exercises: Standing   Other Standing Lumbar Exercises  reviewed proper hip hinge x5 with min cuing for technique. With min cuing patient able to get into the poistion pretty easily.     Other Standing Lumbar Exercises  scap retraction blue x20- with abdominal breathing       Lumbar Exercises: Supine   AB Set Limitations  reviewed abdominal breathing     Bridge Limitations  x10     Other Supine Lumbar Exercises  supine clam x20 green       Manual Therapy   Manual Therapy  Soft tissue mobilization;Joint mobilization;Manual Traction    Joint Mobilization  Prone PA glides from L2-L5     Soft tissue mobilization  IASTYM to lumbar paraspinals     Manual Traction  LAD grade II and III ocilations to both legs;  Trigger Point Dry Needling - 11/19/19 0001    Consent Given?  Yes    Education Handout Provided  Previously provided    Dry Needling Comments  2 spots on the right 1 onl the left.     Lumbar multifidi Response  Twitch response elicited           PT Education - 11/19/19 1024    Education Details  reviewed stretches and exercises; reviewed lifting technique    Person(s) Educated  Patient    Methods  Explanation;Demonstration;Tactile cues;Verbal cues    Comprehension  Verbalized understanding;Returned demonstration;Verbal cues required;Tactile cues required       PT Short Term Goals - 11/19/19 1539      PT SHORT TERM GOAL #1   Title  Pt will be independent in her HEP.    Baseline  working on base HEP    Time  3    Period  Weeks    Status  Achieved    Target Date  11/20/19        PT Long Term Goals - 10/30/19 1720      PT LONG TERM GOAL #1   Title  Pt will be able to improve his L LE strength to grossly 5/5 in order to improve his functional mobility and gait.    Baseline  -4/5 grossly    Time  6    Period  Weeks     Status  New    Target Date  12/11/19      PT LONG TERM GOAL #2   Title  Pt will be able to improve his 5 time sit to stand in under 15 seconds.    Baseline  37.8 seconds on 10/30/2019    Time  6    Period  Weeks    Status  New    Target Date  12/11/19      PT LONG TERM GOAL #3   Title  pt will be able bend and lift 20# object off the floor demonstrating correct body mechanics with pain </=2/10.    Baseline  unable to bend and lift    Time  6    Period  Weeks    Status  New    Target Date  12/11/19      PT LONG TERM GOAL #4   Title  Pt will be able to amb for >/= 20 minutes with no pain reported.    Baseline  pain can vary    Time  6    Period  Weeks    Status  New    Target Date  12/11/19      PT LONG TERM GOAL #5   Title  Pt will improve his bilateral hamstring flexibliity to >/= 70 degrees to improve mobility.    Baseline  see flow sheets    Time  6    Period  Weeks    Status  New    Target Date  12/11/19            Plan - 11/19/19 1104    Clinical Impression Statement  Patient hasd some stiffness today along his lower lumbar paraspinals. he had improved stiffness after treatment. He had an    Comorbidities  HTN, lupus, anemia, h/o R shoulder pain, h/o lumbago    Examination-Activity Limitations  Lift;Squat;Stairs;Stand;Bend;Transfers    Examination-Participation Restrictions  Community Activity;Driving;Yard Work;Other    Stability/Clinical Decision Making  Stable/Uncomplicated    Clinical Decision Making  Low  Rehab Potential  Good    PT Frequency  2x / week    PT Duration  6 weeks    PT Treatment/Interventions  ADLs/Self Care Home Management;Cryotherapy;Electrical Stimulation;Moist Heat;Traction;Ultrasound;Therapeutic activities;Functional mobility training;Stair training;Gait training;Therapeutic exercise;Balance training;Neuromuscular re-education;Patient/family education;Manual techniques;Passive range of motion;Dry needling;Taping    PT Next Visit Plan   lumbar stretching, LE stretching, core stabilization, STM to lumbar paraspinals, modalities as needed, continue to progress as tolerated.    PT Home Exercise Plan  access code: [email or web address] (hamstring stretch, PPT, supine marching, trunk rotation in hook lying)    Consulted and Agree with Plan of Care  Patient       Patient will benefit from skilled therapeutic intervention in order to improve the following deficits and impairments:  Pain, Postural dysfunction, Impaired flexibility, Decreased strength, Decreased activity tolerance, Decreased range of motion, Difficulty walking, Decreased balance  Visit Diagnosis: Acute left-sided low back pain with left-sided sciatica  Muscle weakness (generalized)  Difficulty in walking, not elsewhere classified     Problem List Patient Active Problem List   Diagnosis Date Noted  . On prednisone therapy 01/28/2014  . Lupus (systemic lupus erythematosus) (HCC) 10/02/2013  . Hyponatremia 10/02/2013  . Acute renal failure (HCC) 09/30/2013  . Other pancytopenia (HCC) 09/29/2013  . Anemia of chronic disease 09/29/2013  . Joint pain 09/28/2013  . 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 LUMBAGO 07/08/2009  . HYPERTENSION 02/18/2009  . HIP PAIN, LEFT, CHRONIC 02/18/2009    [name] PT DPT  11/19/2019, 3:40 PM  [facility] [address] [place], [place], [address] Phone: [phone]   Fax:  [phone]  Name: Austin [name] MRN: [id number] Date of Birth: November 27, 1976

## 2019-11-23 ENCOUNTER — Encounter: Payer: Self-pay | Admitting: Physical Therapy

## 2019-11-23 ENCOUNTER — Ambulatory Visit: Payer: Federal, State, Local not specified - PPO | Admitting: Physical Therapy

## 2019-11-23 ENCOUNTER — Other Ambulatory Visit: Payer: Self-pay

## 2019-11-23 DIAGNOSIS — R262 Difficulty in walking, not elsewhere classified: Secondary | ICD-10-CM | POA: Diagnosis not present

## 2019-11-23 DIAGNOSIS — M5442 Lumbago with sciatica, left side: Secondary | ICD-10-CM

## 2019-11-23 DIAGNOSIS — M6281 Muscle weakness (generalized): Secondary | ICD-10-CM

## 2019-11-23 NOTE — Therapy (Signed)
[place], [place], [address] Phone: [phone]   Fax:  [phone]  Physical Therapy Treatment  Patient Details  Name: Austin [name] MRN: [id number] Date of Birth: 1976/04/20 Referring Provider (PT): Lynne Leader MD   Encounter Date: 11/23/2019  PT End of Session - 11/23/19 0851    Visit Number  5    Number of Visits  13    Date for PT Re-Evaluation  12/21/19    PT Start Time  0845    PT Stop Time  0930    PT Time Calculation (min)  45 min    Activity Tolerance  Patient tolerated treatment well    Behavior During Therapy  [facility] for tasks assessed/performed       Past Medical History:  Diagnosis Date  . HYPERLIPIDEMIA 02/18/2009  . HYPERTENSION 02/18/2009  . 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 Nephritis   . Pancytopenia [facility])     Past Surgical History:  Procedure Laterality Date  . RENAL BIOPSY  2003  . TONSILLECTOMY      There were no vitals filed for this visit.  Subjective Assessment - 11/23/19 0848    Subjective  Patient repoorts yesterday was tough. He had pain in the middle of his back.    Pertinent History  h/o HTN, Lupus, lumbago, anemia, 1 year ago had injection in R shoulder.    Limitations  Walking;Standing    How long can you sit comfortably?  sitting is the most comfortable    How long can you stand comfortably?  unsure    How long can you walk comfortably?  a little over an hour    Patient Stated Goals  Work without pain    Currently in Pain?  Yes    Pain Score  3     Pain Location  Back    Pain Orientation  Left    Pain Descriptors / Indicators  Aching    Pain Type  Chronic pain    Pain Onset  1 to 4 weeks ago    Pain Frequency  Constant    Aggravating Factors   standing and walking    Pain Relieving Factors  rest    Effect of Pain on Daily Activities  reviewed HEP                       [facility] Adult PT Treatment/Exercise - 11/23/19 0001      Lumbar  Exercises: Stretches   Passive Hamstring Stretch  2 reps;30 seconds    Passive Hamstring Stretch Limitations  some increased pain on the left     Piriformis Stretch Limitations  3x20 sec hold       Lumbar Exercises: Standing   Other Standing Lumbar Exercises  scap retraction blue x20- with abdominal breathing Shoulder extension with breathing 2x10       Lumbar Exercises: Supine   AB Set Limitations  reviewed abdominal breathing     Bent Knee Raise Limitations  x20     Bridge Limitations  2x10     Other Supine Lumbar Exercises  supine clam x20 green       Manual Therapy   Manual Therapy  Soft tissue mobilization;Joint mobilization;Manual Traction    Joint Mobilization  Prone PA glides from L2-L5     Soft tissue mobilization  IASTYM to lumbar paraspinals     Manual Traction  LAD grade II and III ocilations to both legs;  Trigger Point Dry Needling - 11/23/19 0001    Consent Given?  Yes    Education Handout Provided  Previously provided    Dry Needling Comments  2 spots on the right 1 onl the left.     Lumbar multifidi Response  Twitch response elicited           PT Education - 11/23/19 0850    Education Details  HEP and symptom mangement; improtance of core muscles    Person(s) Educated  Patient    Methods  Explanation;Demonstration;Tactile cues;Verbal cues    Comprehension  Verbalized understanding;Returned demonstration;Verbal cues required;Tactile cues required       PT Short Term Goals - 11/23/19 0920      PT SHORT TERM GOAL #1   Title  Pt will be independent in her HEP.    Baseline  working on base HEP    Time  3    Period  Weeks    Status  Achieved    Target Date  11/20/19        PT Long Term Goals - 10/30/19 1720      PT LONG TERM GOAL #1   Title  Pt will be able to improve his L LE strength to grossly 5/5 in order to improve his functional mobility and gait.    Baseline  -4/5 grossly    Time  6    Period  Weeks    Status  New    Target Date   12/11/19      PT LONG TERM GOAL #2   Title  Pt will be able to improve his 5 time sit to stand in under 15 seconds.    Baseline  37.8 seconds on 10/30/2019    Time  6    Period  Weeks    Status  New    Target Date  12/11/19      PT LONG TERM GOAL #3   Title  pt will be able bend and lift 20# object off the floor demonstrating correct body mechanics with pain </=2/10.    Baseline  unable to bend and lift    Time  6    Period  Weeks    Status  New    Target Date  12/11/19      PT LONG TERM GOAL #4   Title  Pt will be able to amb for >/= 20 minutes with no pain reported.    Baseline  pain can vary    Time  6    Period  Weeks    Status  New    Target Date  12/11/19      PT LONG TERM GOAL #5   Title  Pt will improve his bilateral hamstring flexibliity to >/= 70 degrees to improve mobility.    Baseline  see flow sheets    Time  6    Period  Weeks    Status  New    Target Date  12/11/19            Plan - 11/23/19 0916    Clinical Impression Statement  Patient continues to have spasming in his lower lumbar paraspinals. he had a palpable improvement with needling and soft tissue mobilization. Patient continues to tolerate ther-ex well. Therapy will begin lift training next week depedning on how the patient is doing.    Personal Factors and Comorbidities  Comorbidity 2    Comorbidities  HTN, lupus, anemia, h/o R shoulder pain, h/o lumbago  Examination-Activity Limitations  Lift;Squat;Stairs;Stand;Bend;Transfers    Stability/Clinical Decision Making  Stable/Uncomplicated    Clinical Decision Making  Low    Rehab Potential  Good    PT Frequency  2x / week    PT Duration  6 weeks    PT Treatment/Interventions  ADLs/Self Care Home Management;Cryotherapy;Electrical Stimulation;Moist Heat;Traction;Ultrasound;Therapeutic activities;Functional mobility training;Stair training;Gait training;Therapeutic exercise;Balance training;Neuromuscular re-education;Patient/family  education;Manual techniques;Passive range of motion;Dry needling;Taping    PT Next Visit Plan  lumbar stretching, LE stretching, core stabilization, STM to lumbar paraspinals, modalities as needed, continue to progress as tolerated.    PT Home Exercise Plan  access code: [email or web address] (hamstring stretch, PPT, supine marching, trunk rotation in hook lying)    Consulted and Agree with Plan of Care  Patient       Patient will benefit from skilled therapeutic intervention in order to improve the following deficits and impairments:  Pain, Postural dysfunction, Impaired flexibility, Decreased strength, Decreased activity tolerance, Decreased range of motion, Difficulty walking, Decreased balance  Visit Diagnosis: Acute left-sided low back pain with left-sided sciatica  Muscle weakness (generalized)  Difficulty in walking, not elsewhere classified     Problem List Patient Active Problem List   Diagnosis Date Noted  . On prednisone therapy 01/28/2014  . Lupus (systemic lupus erythematosus) (HCC) 10/02/2013  . Hyponatremia 10/02/2013  . Acute renal failure (HCC) 09/30/2013  . Other pancytopenia (HCC) 09/29/2013  . Anemia of chronic disease 09/29/2013  . Joint pain 09/28/2013  . 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 LUMBAGO 07/08/2009  . HYPERTENSION 02/18/2009  . HIP PAIN, LEFT, CHRONIC 02/18/2009    [name] PT DPT  11/23/2019, 10:16 AM  [facility] [address] [place], [place], [address] Phone: [phone]   Fax:  [phone]  Name: Austin [name] MRN: [id number] Date of Birth: [birth year]

## 2019-11-26 ENCOUNTER — Other Ambulatory Visit: Payer: Self-pay

## 2019-11-26 ENCOUNTER — Encounter: Payer: Self-pay | Admitting: Physical Therapy

## 2019-11-26 ENCOUNTER — Ambulatory Visit: Payer: Federal, State, Local not specified - PPO | Admitting: Physical Therapy

## 2019-11-26 DIAGNOSIS — R262 Difficulty in walking, not elsewhere classified: Secondary | ICD-10-CM | POA: Diagnosis not present

## 2019-11-26 DIAGNOSIS — M5442 Lumbago with sciatica, left side: Secondary | ICD-10-CM

## 2019-11-26 DIAGNOSIS — M6281 Muscle weakness (generalized): Secondary | ICD-10-CM | POA: Diagnosis not present

## 2019-11-26 NOTE — Therapy (Signed)
[facility] [place], [place], [address] Phone: [phone]   Fax:  [phone]  Physical Therapy Treatment  Patient Details  Name: Austin [name] MRN: [id number] Date of Birth: 12-02-1976 Referring Provider (PT): Lynne Leader MD   Encounter Date: 11/26/2019  PT End of Session - 11/26/19 1022    Visit Number  6    Number of Visits  13    Date for PT Re-Evaluation  12/21/19    PT Start Time  9381    PT Stop Time  1058    PT Time Calculation (min)  43 min    Activity Tolerance  Patient tolerated treatment well    Behavior During Therapy  [facility] for tasks assessed/performed       Past Medical History:  Diagnosis Date  . HYPERLIPIDEMIA 02/18/2009  . HYPERTENSION 02/18/2009  . Lupus glomerulonephritis ([facility])   . Nephritis   . Pancytopenia [facility])     Past Surgical History:  Procedure Laterality Date  . RENAL BIOPSY  2003  . TONSILLECTOMY      There were no vitals filed for this visit.  Subjective Assessment - 11/26/19 1020    Subjective  Patient worked this weekend and had some pain. His pain was in his left buttock and low back. Today it is  better.    Pertinent History  h/o HTN, Lupus, lumbago, anemia, 1 year ago had injection in R shoulder.    Limitations  Walking;Standing    How long can you sit comfortably?  sitting is the most comfortable    How long can you stand comfortably?  unsure    How long can you walk comfortably?  a little over an hour    Patient Stated Goals  Work without pain    Currently in Pain?  Yes    Pain Score  3     Pain Location  Back    Pain Orientation  Left    Pain Descriptors / Indicators  Aching    Pain Type  Chronic pain    Pain Radiating Towards  lateral gluteal    Pain Onset  1 to 4 weeks ago    Pain Frequency  Constant    Aggravating Factors   standing and walking    Pain Relieving Factors  rest    Effect of Pain on Daily Activities  reviewed HEP                        [facility] Adult PT Treatment/Exercise - 11/26/19 0001      Self-Care   Self-Care  Lifting    Lifting  reviewed lifiting technique 2x10 15lbs no cuing required for treatment.       Lumbar Exercises: Stretches   Piriformis Stretch Limitations  3x20 sec hold       Lumbar Exercises: Supine   AB Set Limitations  reviewed abdominal breathing     Bent Knee Raise Limitations  x20     Bridge Limitations  2x10     Other Supine Lumbar Exercises  supine clam x20 green       Lumbar Exercises: Prone   Other Prone Lumbar Exercises  prone on elbows at two levels 2 min hold each; prone pressups 2x10 with cuing for symptom management      Manual Therapy   Manual Therapy  Soft tissue mobilization;Joint mobilization;Manual Traction    Joint Mobilization  Prone PA glides from L2-L5     Soft tissue  mobilization  IASTYM to lumbar paraspinals     Manual Traction  LAD grade II and III ocilations to both legs;        Trigger Point Dry Needling - 11/26/19 0001    Consent Given?  Yes    Education Handout Provided  Previously provided    Dry Needling Comments  3 spots on left     Lumbar multifidi Response  Twitch response elicited           PT Education - 11/26/19 1022    Education Details  rewiewed HEP and symptom mangement    Person(s) Educated  Patient    Methods  Explanation;Demonstration;Tactile cues;Verbal cues    Comprehension  Verbalized understanding;Returned demonstration;Verbal cues required;Tactile cues required       PT Short Term Goals - 11/26/19 1219      PT SHORT TERM GOAL #1   Title  Pt will be independent in her HEP.    Baseline  working on base HEP    Time  3    Period  Weeks    Status  Achieved    Target Date  11/20/19        PT Long Term Goals - 10/30/19 1720      PT LONG TERM GOAL #1   Title  Pt will be able to improve his L LE strength to grossly 5/5 in order to improve his functional mobility and gait.    Baseline  -4/5  grossly    Time  6    Period  Weeks    Status  New    Target Date  12/11/19      PT LONG TERM GOAL #2   Title  Pt will be able to improve his 5 time sit to stand in under 15 seconds.    Baseline  37.8 seconds on 10/30/2019    Time  6    Period  Weeks    Status  New    Target Date  12/11/19      PT LONG TERM GOAL #3   Title  pt will be able bend and lift 20# object off the floor demonstrating correct body mechanics with pain </=2/10.    Baseline  unable to bend and lift    Time  6    Period  Weeks    Status  New    Target Date  12/11/19      PT LONG TERM GOAL #4   Title  Pt will be able to amb for >/= 20 minutes with no pain reported.    Baseline  pain can vary    Time  6    Period  Weeks    Status  New    Target Date  12/11/19      PT LONG TERM GOAL #5   Title  Pt will improve his bilateral hamstring flexibliity to >/= 70 degrees to improve mobility.    Baseline  see flow sheets    Time  6    Period  Weeks    Status  New    Target Date  12/11/19            Plan - 11/26/19 1205    Clinical Impression Statement  Patient had tednerness to plapation in the left lower paraspinal. Therapy reviewed press-ups with the patient. Therapy had reviewed in ealrier visits but he had some pain. he had less pain today but did report some stiffness. Patient also worked on Advertising account executive. He reported fatigue  but no significant increases in pain. He was encouraged to continue with press ups at home.    Comorbidities  HTN, lupus, anemia, h/o R shoulder pain, h/o lumbago    Examination-Activity Limitations  Lift;Squat;Stairs;Stand;Bend;Transfers    Stability/Clinical Decision Making  Stable/Uncomplicated    Clinical Decision Making  Low    Rehab Potential  Good    PT Frequency  2x / week    PT Duration  6 weeks    PT Treatment/Interventions  ADLs/Self Care Home Management;Cryotherapy;Electrical Stimulation;Moist Heat;Traction;Ultrasound;Therapeutic activities;Functional mobility  training;Stair training;Gait training;Therapeutic exercise;Balance training;Neuromuscular re-education;Patient/family education;Manual techniques;Passive range of motion;Dry needling;Taping    PT Next Visit Plan  lumbar stretching, LE stretching, core stabilization, STM to lumbar paraspinals, modalities as needed, continue to progress as tolerated.    PT Home Exercise Plan  access code: [email or web address] (hamstring stretch, PPT, supine marching, trunk rotation in hook lying)    Consulted and Agree with Plan of Care  Patient       Patient will benefit from skilled therapeutic intervention in order to improve the following deficits and impairments:  Pain, Postural dysfunction, Impaired flexibility, Decreased strength, Decreased activity tolerance, Decreased range of motion, Difficulty walking, Decreased balance  Visit Diagnosis: Acute left-sided low back pain with left-sided sciatica  Muscle weakness (generalized)  Difficulty in walking, not elsewhere classified     Problem List Patient Active Problem List   Diagnosis Date Noted  . On prednisone therapy 01/28/2014  . Lupus (systemic lupus erythematosus) (HCC) 10/02/2013  . Hyponatremia 10/02/2013  . Acute renal failure (HCC) 09/30/2013  . Other pancytopenia (HCC) 09/29/2013  . Anemia of chronic disease 09/29/2013  . Joint pain 09/28/2013  . 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 LUMBAGO 07/08/2009  . HYPERTENSION 02/18/2009  . HIP PAIN, LEFT, CHRONIC 02/18/2009    Dessie Comaavid J Bawi Lakins PT DPT  11/26/2019, 12:23 PM  [facility] [address] [place], [place], [address] Phone: [phone]   Fax:  [phone]  Name: Austin [name] MRN: [id number] Date of Birth: 1976/01/03

## 2019-11-27 ENCOUNTER — Encounter: Payer: Self-pay | Admitting: Family Medicine

## 2019-11-27 ENCOUNTER — Other Ambulatory Visit: Payer: Self-pay

## 2019-11-27 ENCOUNTER — Ambulatory Visit: Payer: Federal, State, Local not specified - PPO | Admitting: Family Medicine

## 2019-11-27 VITALS — BP 104/70 | HR 88 | Ht 77.0 in | Wt 252.2 lb

## 2019-11-27 DIAGNOSIS — S39012A Strain of muscle, fascia and tendon of lower back, initial encounter: Secondary | ICD-10-CM

## 2019-11-27 NOTE — Progress Notes (Signed)
I, Wendy Poet, LAT, ATC, am serving as scribe for Dr. Lynne Leader.  Austin [name] is a 43 y.o. male who presents to [facility] today for f/u of L-sided low back pain and L LE radicular pain.  He was last seen by Dr. Georgina Snell on 11/13/19 and noted 50-60% improvement at that time.  He has attended 6 PT sessions.  Since his last visit, pt reports improvement in his low back pain.  He con't to note about 50-60% improvement.  He states that he has returned to light duty work x 6-7 hours/day.  He states that PT has been going well but will get sore the day after those sessions due to the dry needling.  He states that he is able to stand for approximately 3 hours before he starts to get soreness and pain in his lower back.  Pt states that the L leg pain and numbness/tingling in his L toes has resolved.  He has discontinued both the prednisone and the Lyrica.    ROS:  As above  Exam:  BP 104/70 (BP Location: Left Arm, Patient Position: Sitting, Cuff Size: Large)   Pulse 88   Ht 6\' 5"  (1.956 m)   Wt 252 lb 3.2 oz (114.4 kg)   SpO2 94%   BMI 29.91 kg/m  Wt Readings from Last 5 Encounters:  11/27/19 252 lb 3.2 oz (114.4 kg)  11/13/19 249 lb 3.2 oz (113 kg)  10/29/19 248 lb (112.5 kg)  10/15/19 247 lb 9.6 oz (112.3 kg)  10/09/19 242 lb 3.2 oz (109.9 kg)   General: Well Developed, well nourished, and in no acute distress.  Neuro/Psych: Alert and oriented x3, extra-ocular muscles intact, able to move all 4 extremities, sensation grossly intact. Skin: Warm and dry, no rashes noted.  Respiratory: Not using accessory muscles, speaking in full sentences, trachea midline.  Cardiovascular: Pulses palpable, no extremity edema. Abdomen: Does not appear distended. MSK: L-spine: Normal appearance. Normal range of motion to extension rotation and lateral flexion.  Somewhat limited flexion. Lower extremity strength is intact throughout. Normal gait.     Assessment and Plan: 43 y.o.  male with follow-up back pain and lumbar radiculopathy.  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  Continue light duty restrictions at work and physical therapy.  Recheck in 4 weeks.  Completed new duty status report which will be sent to scan.  Cautions reviewed.  I spent 15 minutes with this patient, greater than 50% was face-to-face time counseling regarding home exercise program, return to work plan, work restrictions..    Historical information moved to improve visibility of documentation.  Past Medical History:  Diagnosis Date  . HYPERLIPIDEMIA 02/18/2009  . HYPERTENSION 02/18/2009  . 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 Nephritis   . Pancytopenia [facility])    Past Surgical History:  Procedure Laterality Date  . RENAL BIOPSY  2003  . TONSILLECTOMY     Social History   Tobacco Use  . Smoking status: Never Smoker  . Smokeless tobacco: Never Used  Substance Use Topics  . Alcohol use: Yes    Comment: rare   family history includes Cancer in his maternal grandfather; Diabetes in his maternal grandmother; Hyperlipidemia in his father; Hypertension in his maternal grandfather and maternal grandmother.  Medications: Current Outpatient Medications  Medication Sig Dispense Refill  . acetaminophen (TYLENOL) 500 MG tablet Take 1,000 mg by mouth every 6 (six) hours as needed for moderate pain or headache.    Marland Kitchen  enalapril (VASOTEC) 5 MG tablet Take 5 mg by mouth daily.    . hydroxychloroquine (PLAQUENIL) 200 MG tablet Take 2 tablets by mouth daily with food or milk.    . pantoprazole (PROTONIX) 40 MG tablet Take 1 tablet (40 mg total) by mouth 2 (two) times daily. 60 tablet 5  . pregabalin (LYRICA) 75 MG capsule Take 1 capsule (75 mg total) by mouth 2 (two) times daily. (Patient not taking: Reported on 11/27/2019) 60 capsule 3   No current facility-administered medications for this visit.   No Known Allergies    Discussed warning signs or symptoms.  Please see discharge instructions. Patient expresses understanding.  The above documentation has been reviewed and is accurate and complete Clementeen Graham

## 2019-11-27 NOTE — Patient Instructions (Signed)
Thank you for coming in today. Continue PT and home exercises.  Recheck with me in 3-4 weeks.  Return sooner if needed.

## 2019-11-28 ENCOUNTER — Ambulatory Visit: Payer: Federal, State, Local not specified - PPO | Admitting: Physical Therapy

## 2019-11-28 ENCOUNTER — Encounter: Payer: Self-pay | Admitting: Physical Therapy

## 2019-11-28 DIAGNOSIS — R262 Difficulty in walking, not elsewhere classified: Secondary | ICD-10-CM

## 2019-11-28 DIAGNOSIS — M5442 Lumbago with sciatica, left side: Secondary | ICD-10-CM | POA: Diagnosis not present

## 2019-11-28 DIAGNOSIS — M6281 Muscle weakness (generalized): Secondary | ICD-10-CM | POA: Diagnosis not present

## 2019-11-28 NOTE — Therapy (Signed)
[facility] [address] [place], [place], [address] Phone: [phone]   Fax:  [phone]  Physical Therapy Treatment  Patient Details  Name: Austin [name] MRN: [id number] Date of Birth: Aug 19, 1976 Referring Provider (PT): Clementeen Graham MD   Encounter Date: 11/28/2019  PT End of Session - 11/28/19 1041    Visit Number  7    Number of Visits  13    Date for PT Re-Evaluation  12/21/19    PT Start Time  1015    PT Stop Time  1056    PT Time Calculation (min)  41 min    Activity Tolerance  Patient tolerated treatment well    Behavior During Therapy  [facility] for tasks assessed/performed       Past Medical History:  Diagnosis Date  . HYPERLIPIDEMIA 02/18/2009  . HYPERTENSION 02/18/2009  . Lupus glomerulonephritis ([facility])   . Nephritis   . Pancytopenia [facility])     Past Surgical History:  Procedure Laterality Date  . RENAL BIOPSY  2003  . TONSILLECTOMY      There were no vitals filed for this visit.  Subjective Assessment - 11/28/19 1021    Subjective  Patient ffeels like his back is moving in the right direction. he is having minor pain today.    Pertinent History  h/o HTN, Lupus, lumbago, anemia, 1 year ago had injection in R shoulder.    Limitations  Walking;Standing    How long can you sit comfortably?  sitting is the most comfortable    How long can you stand comfortably?  unsure    How long can you walk comfortably?  a little over an hour    Patient Stated Goals  Work without pain    Currently in Pain?  Yes    Pain Score  2     Pain Location  Back    Pain Orientation  Left    Pain Descriptors / Indicators  Aching    Pain Type  Chronic pain    Pain Onset  1 to 4 weeks ago    Pain Frequency  Constant    Aggravating Factors   standing and walking    Pain Relieving Factors  rest    Effect of Pain on Daily Activities  reviewed HEP                       [facility] Adult PT Treatment/Exercise - 11/28/19  0001      Lumbar Exercises: Stretches   Passive Hamstring Stretch  2 reps;30 seconds    Single Knee to Chest Stretch  2 reps;20 seconds;Right;Left    Piriformis Stretch Limitations  3x20 sec hold       Lumbar Exercises: Standing   Other Standing Lumbar Exercises  chops red x15 each way, pallowf press 2x10 red each       Lumbar Exercises: Supine   AB Set Limitations  reviewed abdominal breathing     Bent Knee Raise Limitations  x20     Bridge Limitations  2x10     Other Supine Lumbar Exercises  supine clam x20 green       Manual Therapy   Manual Therapy  Soft tissue mobilization;Joint mobilization;Manual Traction    Joint Mobilization  Prone PA glides from L2-L5     Soft tissue mobilization  IASTYM to lumbar paraspinals     Manual Traction  LAD grade II and III ocilations to both legs;  PT Education - 11/28/19 1022    Education Details  reviewed HEP    Person(s) Educated  Patient    Methods  Explanation;Demonstration;Tactile cues;Verbal cues    Comprehension  Verbalized understanding;Returned demonstration;Verbal cues required;Tactile cues required       PT Short Term Goals - 11/26/19 1219      PT SHORT TERM GOAL #1   Title  Pt will be independent in her HEP.    Baseline  working on base HEP    Time  3    Period  Weeks    Status  Achieved    Target Date  11/20/19        PT Long Term Goals - 11/28/19 1044      PT LONG TERM GOAL #1   Title  Pt will be able to improve his L LE strength to grossly 5/5 in order to improve his functional mobility and gait.    Baseline  improving    Period  Weeks    Status  On-going      PT LONG TERM GOAL #2   Title  Pt will be able to improve his 5 time sit to stand in under 15 seconds.    Baseline  37.8 seconds on 10/30/2019    Time  6    Period  Weeks    Status  On-going      PT LONG TERM GOAL #3   Title  pt will be able bend and lift 20# object off the floor demonstrating correct body mechanics with pain  </=2/10.    Baseline  perfromed box lift today    Time  6    Period  Weeks    Status  On-going      PT LONG TERM GOAL #4   Title  Pt will be able to amb for >/= 20 minutes with no pain reported.    Baseline  pain can vary    Time  6    Period  Weeks    Status  Achieved            Plan - 11/28/19 1041    Clinical Impression Statement  Therapy advanced core strengthening and return to work trianing. He was able to lift a 20 and 30 lb box without increased back pain. He was also able to complete anti-rotational exercises such as the pallof press and low range chops. he was fatigued but had no increase in pain. Therapy will continu to advance functional strengthening.,    Comorbidities  HTN, lupus, anemia, h/o R shoulder pain, h/o lumbago    Examination-Activity Limitations  Lift;Squat;Stairs;Stand;Bend;Transfers    Stability/Clinical Decision Making  Stable/Uncomplicated    Clinical Decision Making  Low    Rehab Potential  Good    PT Frequency  2x / week    PT Duration  6 weeks    PT Treatment/Interventions  ADLs/Self Care Home Management;Cryotherapy;Electrical Stimulation;Moist Heat;Traction;Ultrasound;Therapeutic activities;Functional mobility training;Stair training;Gait training;Therapeutic exercise;Balance training;Neuromuscular re-education;Patient/family education;Manual techniques;Passive range of motion;Dry needling;Taping    PT Next Visit Plan  lumbar stretching, LE stretching, core stabilization, STM to lumbar paraspinals, modalities as needed, continue to progress as tolerated.    PT Home Exercise Plan  access code: [email or web address] (hamstring stretch, PPT, supine marching, trunk rotation in hook lying)       Patient will benefit from skilled therapeutic intervention in order to improve the following deficits and impairments:  Pain, Postural dysfunction, Impaired flexibility, Decreased strength, Decreased activity tolerance, Decreased range of motion, Difficulty walking,  Decreased  balance  Visit Diagnosis: Acute left-sided low back pain with left-sided sciatica  Muscle weakness (generalized)  Difficulty in walking, not elsewhere classified     Problem List Patient Active Problem List   Diagnosis Date Noted  . On prednisone therapy 01/28/2014  . Lupus (systemic lupus erythematosus) (HCC) 10/02/2013  . Hyponatremia 10/02/2013  . Acute renal failure (HCC) 09/30/2013  . Other pancytopenia (HCC) 09/29/2013  . Anemia of chronic disease 09/29/2013  . Joint pain 09/28/2013  . 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 LUMBAGO 07/08/2009  . HYPERTENSION 02/18/2009  . HIP PAIN, LEFT, CHRONIC 02/18/2009    Dessie Comaavid J Leandro Berkowitz PT DPT 11/28/2019, 3:53 PM  [facility] [address] [place], [place], [address] Phone: [phone]   Fax:  [phone]  Name: Austin [name] MRN: [id number] Date of Birth: 06-Jul-1976

## 2019-12-03 ENCOUNTER — Ambulatory Visit: Payer: Federal, State, Local not specified - PPO | Admitting: Physical Therapy

## 2019-12-05 ENCOUNTER — Ambulatory Visit: Payer: Federal, State, Local not specified - PPO | Admitting: Physical Therapy

## 2019-12-10 ENCOUNTER — Ambulatory Visit: Payer: Federal, State, Local not specified - PPO | Admitting: Physical Therapy

## 2019-12-12 ENCOUNTER — Encounter: Payer: Self-pay | Admitting: Physical Therapy

## 2019-12-12 ENCOUNTER — Ambulatory Visit: Payer: Federal, State, Local not specified - PPO | Attending: Family Medicine | Admitting: Physical Therapy

## 2019-12-12 ENCOUNTER — Other Ambulatory Visit: Payer: Self-pay

## 2019-12-12 DIAGNOSIS — M5442 Lumbago with sciatica, left side: Secondary | ICD-10-CM | POA: Insufficient documentation

## 2019-12-12 DIAGNOSIS — M6281 Muscle weakness (generalized): Secondary | ICD-10-CM | POA: Diagnosis not present

## 2019-12-12 DIAGNOSIS — R262 Difficulty in walking, not elsewhere classified: Secondary | ICD-10-CM | POA: Insufficient documentation

## 2019-12-13 NOTE — Therapy (Signed)
[facility] Outpatient Rehabilitation [facility] [address] [place], [place], [address] Phone: [phone]   Fax:  [phone]  Physical Therapy Treatment  Patient Details  Name: ALLYN BERTONI MRN: [id number] Date of Birth: 10/15/1976 Referring Provider (PT): Clementeen Graham MD   Encounter Date: 12/12/2019  PT End of Session - 12/12/19 1022    Visit Number  8    Number of Visits  13    Date for PT Re-Evaluation  12/21/19    PT Start Time  1015    PT Stop Time  1056    PT Time Calculation (min)  41 min    Activity Tolerance  Patient tolerated treatment well    Behavior During Therapy  [facility] for tasks assessed/performed       Past Medical History:  Diagnosis Date  . HYPERLIPIDEMIA 02/18/2009  . HYPERTENSION 02/18/2009  . Lupus glomerulonephritis (HCC)   . Nephritis   . Pancytopenia [facility])     Past Surgical History:  Procedure Laterality Date  . RENAL BIOPSY  2003  . TONSILLECTOMY      There were no vitals filed for this visit.  Subjective Assessment - 12/12/19 1021    Subjective  Patient reports his back has been doing well. He has been to work today and did not have any pain. He has been working on his stretches and exercises at home.    Pertinent History  h/o HTN, Lupus, lumbago, anemia, 1 year ago had injection in R shoulder.    Limitations  Walking;Standing    How long can you sit comfortably?  sitting is the most comfortable    How long can you stand comfortably?  unsure    How long can you walk comfortably?  a little over an hour    Currently in Pain?  No/denies                       [facility] Adult PT Treatment/Exercise - 12/13/19 0001      Self-Care   Lifting  lift from 6 inch box 25# 2x10;  Left and turn to chair 2x5 25 lbs; reviewed the improtance of keeping load close       Lumbar Exercises: Stretches   Passive Hamstring Stretch  2 reps;30 seconds    Single Knee to Chest Stretch  2 reps;20 seconds;Right;Left    Piriformis  Stretch Limitations  3x20 sec hold       Lumbar Exercises: Standing   Other Standing Lumbar Exercises  kettle bell swing       Lumbar Exercises: Supine   Bent Knee Raise Limitations  x20     Bridge Limitations  2x10     Other Supine Lumbar Exercises  supine clam x20 green       Manual Therapy   Manual Therapy  Soft tissue mobilization;Joint mobilization;Manual Traction    Manual Traction  LAD grade II and III ocilations to both legs;              PT Education - 12/12/19 1022    Education Details  HEP and symptom mangement    Person(s) Educated  Patient    Methods  Explanation;Demonstration;Tactile cues;Verbal cues    Comprehension  Verbalized understanding;Returned demonstration;Tactile cues required;Verbal cues required       PT Short Term Goals - 11/26/19 1219      PT SHORT TERM GOAL #1   Title  Pt will be independent in her HEP.    Baseline  working on  base HEP    Time  3    Period  Weeks    Status  Achieved    Target Date  11/20/19        PT Long Term Goals - 11/28/19 1044      PT LONG TERM GOAL #1   Title  Pt will be able to improve his L LE strength to grossly 5/5 in order to improve his functional mobility and gait.    Baseline  improving    Period  Weeks    Status  On-going      PT LONG TERM GOAL #2   Title  Pt will be able to improve his 5 time sit to stand in under 15 seconds.    Baseline  37.8 seconds on 10/30/2019    Time  6    Period  Weeks    Status  On-going      PT LONG TERM GOAL #3   Title  pt will be able bend and lift 20# object off the floor demonstrating correct body mechanics with pain </=2/10.    Baseline  perfromed box lift today    Time  6    Period  Weeks    Status  On-going      PT LONG TERM GOAL #4   Title  Pt will be able to amb for >/= 20 minutes with no pain reported.    Baseline  pain can vary    Time  6    Period  Weeks    Status  Achieved            Plan - 12/12/19 1044    Clinical Impression Statement   Patients session was geared towards lifting. he required min cuing for technique. He did well maintianing a good back posture despite being tall. He has a good understanding of body mechanics and lifting technique. he had no increase in lower back pain. Therapy also reviewed supine core stability exercises. Therapy will continue to progress as tolerated. We will likley add more weight next visit.    Personal Factors and Comorbidities  Comorbidity 2    Comorbidities  HTN, lupus, anemia, h/o R shoulder pain, h/o lumbago    Examination-Activity Limitations  Lift;Squat;Stairs;Stand;Bend;Transfers    Stability/Clinical Decision Making  Stable/Uncomplicated    Clinical Decision Making  Low    Rehab Potential  Good    PT Frequency  2x / week    PT Duration  6 weeks    PT Treatment/Interventions  ADLs/Self Care Home Management;Cryotherapy;Electrical Stimulation;Moist Heat;Traction;Ultrasound;Therapeutic activities;Functional mobility training;Stair training;Gait training;Therapeutic exercise;Balance training;Neuromuscular re-education;Patient/family education;Manual techniques;Passive range of motion;Dry needling;Taping    PT Next Visit Plan  lumbar stretching, LE stretching, core stabilization, STM to lumbar paraspinals, modalities as needed, continue to progress as tolerated.    PT Home Exercise Plan  access code: [email or web address] (hamstring stretch, PPT, supine marching, trunk rotation in hook lying)    Consulted and Agree with Plan of Care  Patient       Patient will benefit from skilled therapeutic intervention in order to improve the following deficits and impairments:  Pain, Postural dysfunction, Impaired flexibility, Decreased strength, Decreased activity tolerance, Decreased range of motion, Difficulty walking, Decreased balance  Visit Diagnosis: Acute left-sided low back pain with left-sided sciatica  Muscle weakness (generalized)  Difficulty in walking, not elsewhere classified     Problem  List Patient Active Problem List   Diagnosis Date Noted  . On prednisone therapy 01/28/2014  . Lupus (systemic lupus erythematosus) (  [place]) 10/02/2013  . Hyponatremia 10/02/2013  . Acute renal failure ([place]) 09/30/2013  . Other pancytopenia ([place]) 09/29/2013  . Anemia of chronic disease 09/29/2013  . Joint pain 09/28/2013  . 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 LUMBAGO 07/08/2009  . HYPERTENSION 02/18/2009  . HIP PAIN, LEFT, CHRONIC 02/18/2009    Carney Living PT DPT 12/13/2019, 9:14 AM  [facility] [address] [place], [place], [address] Phone: [phone]   Fax:  [phone]  Name: ANTIONIO NEGRON MRN: [id number] Date of Birth: September 23, 1976

## 2019-12-17 ENCOUNTER — Ambulatory Visit: Payer: Federal, State, Local not specified - PPO | Admitting: Physical Therapy

## 2019-12-19 ENCOUNTER — Other Ambulatory Visit: Payer: Self-pay

## 2019-12-19 ENCOUNTER — Ambulatory Visit: Payer: Federal, State, Local not specified - PPO | Admitting: Physical Therapy

## 2019-12-19 ENCOUNTER — Encounter: Payer: Self-pay | Admitting: Physical Therapy

## 2019-12-19 DIAGNOSIS — M5442 Lumbago with sciatica, left side: Secondary | ICD-10-CM

## 2019-12-19 DIAGNOSIS — M6281 Muscle weakness (generalized): Secondary | ICD-10-CM

## 2019-12-19 DIAGNOSIS — R262 Difficulty in walking, not elsewhere classified: Secondary | ICD-10-CM

## 2019-12-19 NOTE — Therapy (Signed)
[facility] Outpatient Rehabilitation [facility] [address] [place], [place], [address] Phone: [phone]   Fax:  [phone]  Physical Therapy Treatment  Patient Details  Name: Austin [name] MRN: [id number] Date of Birth: [birth year] Referring Provider (PT): Clementeen Graham MD   Encounter Date: 12/19/2019  PT End of Session - 12/19/19 1450    Visit Number  10    Number of Visits  13    Date for PT Re-Evaluation  12/21/19    PT Start Time  1015    PT Stop Time  1054    PT Time Calculation (min)  39 min    Activity Tolerance  Patient tolerated treatment well    Behavior During Therapy  [facility] for tasks assessed/performed       Past Medical History:  Diagnosis Date  . HYPERLIPIDEMIA 02/18/2009  . HYPERTENSION 02/18/2009  . Lupus glomerulonephritis (HCC)   . Nephritis   . Pancytopenia [facility])     Past Surgical History:  Procedure Laterality Date  . RENAL BIOPSY  2003  . TONSILLECTOMY      There were no vitals filed for this visit.  Subjective Assessment - 12/19/19 1022    Subjective  Patient plans to go back to work Saturday. he has had very little pain and when he doies he can mange it.    Pertinent History  h/o HTN, Lupus, lumbago, anemia, 1 year ago had injection in R shoulder.    Limitations  Walking;Standing    How long can you sit comfortably?  sitting is the most comfortable    How long can you stand comfortably?  unsure    How long can you walk comfortably?  a little over an hour    Patient Stated Goals  Work without pain    Currently in Pain?  No/denies    Pain Onset  1 to 4 weeks ago    Pain Frequency  Constant    Aggravating Factors   standing and walking    Pain Relieving Factors  rest    Effect of Pain on Daily Activities  reviewed HEP         [facility] PT Assessment - 12/19/19 0001      AROM   Lumbar Flexion  70    Lumbar - Right Rotation  no pain     Lumbar - Left Rotation  no pain       Strength   Left Hip Flexion  5/5    Left  Hip Extension  5/5    Left Hip ABduction  5/5    Left Hip ADduction  5/5    Left Knee Flexion  5/5    Left Knee Extension  5/5      Flexibility   Hamstrings  R 65 R 65                    [facility] Adult PT Treatment/Exercise - 12/19/19 0001      Self-Care   Lifting  reviewed how to get into a gym program and how to progress       Lumbar Exercises: Stretches   Passive Hamstring Stretch  2 reps;30 seconds    Single Knee to Chest Stretch  2 reps;20 seconds;Right;Left    Piriformis Stretch Limitations  3x20 sec hold       Lumbar Exercises: Machines for Strengthening   Other Lumbar Machine Exercise  row with breathing 2x10 35    Other Lumbar Machine Exercise  extension with breathing 2x15 35lbs  Lumbar Exercises: Standing   Other Standing Lumbar Exercises  kettle bell swing     Other Standing Lumbar Exercises  pallof press 2x10;       Lumbar Exercises: Supine   Bent Knee Raise Limitations  x20     Bridge Limitations  2x10     Other Supine Lumbar Exercises  supine clam x20 green       Lumbar Exercises: Prone   Opposite Arm/Leg Raise  5 reps;Left arm/Right leg;Right arm/Left leg      Manual Therapy   Manual Therapy  Soft tissue mobilization;Joint mobilization;Manual Traction    Manual Traction  LAD grade II and III ocilations to both legs;              PT Education - 12/19/19 1050    Education Details  final HEP and activity progression    Person(s) Educated  Patient    Methods  Explanation;Demonstration;Tactile cues;Verbal cues    Comprehension  Verbalized understanding;Returned demonstration;Verbal cues required;Tactile cues required       PT Short Term Goals - 12/19/19 1131      PT SHORT TERM GOAL #1   Title  Pt will be independent in her HEP.    Baseline  working on base HEP    Time  3    Period  Weeks    Status  Achieved    Target Date  11/20/19        PT Long Term Goals - 12/19/19 1451      PT LONG TERM GOAL #1   Title  Pt will be  able to improve his L LE strength to grossly 5/5 in order to improve his functional mobility and gait.    Baseline  5/5 left    Time  6    Period  Weeks    Status  Achieved      PT LONG TERM GOAL #2   Title  Pt will be able to improve his 5 time sit to stand in under 15 seconds.    Baseline  no issues with sit to stand    Time  6    Period  Weeks    Status  Achieved      PT LONG TERM GOAL #3   Title  pt will be able bend and lift 20# object off the floor demonstrating correct body mechanics with pain </=2/10.    Baseline  able to lift 2x10 25 lbs    Time  6    Period  Weeks    Status  Achieved      PT LONG TERM GOAL #4   Title  Pt will be able to amb for >/= 20 minutes with no pain reported.    Baseline  pain can vary    Time  6    Period  Weeks    Status  Achieved      PT LONG TERM GOAL #5   Title  Pt will improve his bilateral hamstring flexibliity to >/= 70 degrees to improve mobility.    Baseline  see flow sheets    Time  6    Period  Weeks    Status  Achieved            Plan - 12/19/19 1112    Clinical Impression Statement  Reviewed final HEP with patient. Revied higher level exercises. Also reviewed gym activity and progression in the gym when he goes back. The patient has had lift training and has been able to  lift a 25 lb weight. He was also able to pull 35lbs of weight with gym machines today. He will be discharged from PT to HEP today.    Comorbidities  HTN, lupus, anemia, h/o R shoulder pain, h/o lumbago    Examination-Activity Limitations  Lift;Squat;Stairs;Stand;Bend;Transfers    Examination-Participation Restrictions  Community Activity;Driving;Yard Work;Other    Stability/Clinical Decision Making  Stable/Uncomplicated    Clinical Decision Making  Low    Rehab Potential  Good    PT Frequency  2x / week    PT Duration  6 weeks    PT Treatment/Interventions  ADLs/Self Care Home Management;Cryotherapy;Electrical Stimulation;Moist  Heat;Traction;Ultrasound;Therapeutic activities;Functional mobility training;Stair training;Gait training;Therapeutic exercise;Balance training;Neuromuscular re-education;Patient/family education;Manual techniques;Passive range of motion;Dry needling;Taping    PT Next Visit Plan  lumbar stretching, LE stretching, core stabilization, STM to lumbar paraspinals, modalities as needed, continue to progress as tolerated.    PT Home Exercise Plan  access code: [email or web address] (hamstring stretch, PPT, supine marching, trunk rotation in hook lying)       Patient will benefit from skilled therapeutic intervention in order to improve the following deficits and impairments:  Pain, Postural dysfunction, Impaired flexibility, Decreased strength, Decreased activity tolerance, Decreased range of motion, Difficulty walking, Decreased balance  Visit Diagnosis: Acute left-sided low back pain with left-sided sciatica  Muscle weakness (generalized)  Difficulty in walking, not elsewhere classified     Problem List Patient Active Problem List   Diagnosis Date Noted  . On prednisone therapy 01/28/2014  . Lupus (systemic lupus erythematosus) ([place]) 10/02/2013  . Hyponatremia 10/02/2013  . Acute renal failure ([place]) 09/30/2013  . Other pancytopenia ([place]) 09/29/2013  . Anemia of chronic disease 09/29/2013  . Joint pain 09/28/2013  . 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 LUMBAGO 07/08/2009  . HYPERTENSION 02/18/2009  . HIP PAIN, LEFT, CHRONIC 02/18/2009    Carney Living PT DPT  12/19/2019, 2:56 PM  [facility] [address] [place], [place], [address] Phone: [phone]   Fax:  [phone]  Name: Austin [name] MRN: [id number] Date of Birth: 10-09-1976

## 2019-12-21 ENCOUNTER — Encounter: Payer: Self-pay | Admitting: Family Medicine

## 2019-12-21 ENCOUNTER — Other Ambulatory Visit: Payer: Self-pay

## 2019-12-21 ENCOUNTER — Ambulatory Visit: Payer: Federal, State, Local not specified - PPO | Admitting: Family Medicine

## 2019-12-21 VITALS — BP 112/74 | HR 86 | Ht 77.0 in | Wt 251.2 lb

## 2019-12-21 DIAGNOSIS — S39012D Strain of muscle, fascia and tendon of lower back, subsequent encounter: Secondary | ICD-10-CM | POA: Diagnosis not present

## 2019-12-21 MED ORDER — CYCLOBENZAPRINE HCL 10 MG PO TABS: 10.0000 mg | ORAL_TABLET | Freq: Three times a day (TID) | ORAL | 1 refills | Status: DC | PRN

## 2019-12-21 NOTE — Progress Notes (Addendum)
   I, Christoper Fabian, LAT, ATC, am serving as scribe for Dr. Clementeen Graham.  Austin [name] is a 44 y.o. male who presents to [facility] at [facility] today for low back pain and radicular L LE pain.  He was last seen by Dr. Denyse Amass on 11/27/19 and was reporting 50-60% improvement in his symptoms at that time.  His L leg pain and numbness/tingling had resolved at his last visit and he had d/c-ed his prednisone and Lyrica.  He had returned to work light duty.  He has completed 9 PT sessions and has been d/c-ed from PT.  Since his last visit, pt reports mild low back pain/stiffness in his lower back mainly in the mornings.  Pt rates his improvement at 85%.  He has not had any issues at work w/ light duty.   Pertinent review of systems: No fevers chills nausea vomiting or diarrhea.  Relevant historical information: History of lupus managed with Plaquenil.  History of nephrotic syndrome with FSGS lesions.   Exam:  BP 112/74 (BP Location: Left Arm, Patient Position: Sitting, Cuff Size: Large)   Pulse 86   Ht 6\' 5"  (1.956 m)   Wt 251 lb 3.2 oz (113.9 kg)   SpO2 96%   BMI 29.79 kg/m  General: Well Developed, well nourished, and in no acute distress.   MSK: L-spine: Normal motion normal gait.      Assessment and Plan: 44 y.o. male with low back pain.  Significant improvement but still having some stiffness and pain with light duty at work.  Plan to try returning to work full duty.  Patient will let me know if he is not able to at that point we will proceed with MRI to further characterize source of pain and for injection planning. We will go ahead and provide a refill of Flexeril for use as needed.  Patient notes he does not need to take it very often but it is helpful to have as a backup.  Total encounter time 20 minutes date of service including charting time.  PDMP not reviewed this encounter. No orders of the defined types were placed in this encounter.  Meds ordered  this encounter  Medications  . cyclobenzaprine (FLEXERIL) 10 MG tablet    Sig: Take 1 tablet (10 mg total) by mouth 3 (three) times daily as needed for muscle spasms.    Dispense:  30 tablet    Refill:  1     Discussed warning signs or symptoms. Please see discharge instructions. Patient expresses understanding.  The above documentation has been reviewed and is accurate and complete 54

## 2019-12-21 NOTE — Patient Instructions (Signed)
Thank you for coming in today. Return to work full duty.  If having problems let me know.  Next step for me is MRI lumbar spine.  Keep me updated.  Use flexeril sparingly as needed.

## 2019-12-25 ENCOUNTER — Ambulatory Visit: Payer: Federal, State, Local not specified - PPO | Admitting: Family Medicine

## 2020-10-16 ENCOUNTER — Encounter: Payer: Self-pay | Admitting: Family Medicine

## 2020-10-16 ENCOUNTER — Other Ambulatory Visit: Payer: Self-pay

## 2020-10-16 ENCOUNTER — Ambulatory Visit: Payer: Federal, State, Local not specified - PPO | Admitting: Family Medicine

## 2020-10-16 VITALS — BP 110/78 | HR 75 | Ht 77.0 in | Wt 244.2 lb

## 2020-10-16 DIAGNOSIS — M545 Low back pain, unspecified: Secondary | ICD-10-CM

## 2020-10-16 MED ORDER — PREDNISONE 10 MG (48) PO TBPK: ORAL_TABLET | Freq: Every day | ORAL | 0 refills | Status: DC

## 2020-10-16 MED ORDER — TIZANIDINE HCL 4 MG PO TABS: 4.0000 mg | ORAL_TABLET | Freq: Four times a day (QID) | ORAL | 1 refills | Status: DC | PRN

## 2020-10-16 NOTE — Patient Instructions (Addendum)
Thank you for coming in today.  Plan for PT if not improved.  Plan for heat and TENS unit.   Prednisone and muscle relaxer.   Recheck if not improving.

## 2020-10-16 NOTE — Progress Notes (Signed)
   I, Christoper Fabian, LAT, ATC, am serving as scribe for Dr. Clementeen Graham.  Austin [name] is a 44 y.o. male who presents to [facility] at [facility] today for f/u of LBP.  He was last seen by Dr. Denyse Amass on 12/21/19 for f/u of LBP and radicular L LE pain and noted 85% improvement in his symptoms.  He completed 10 PT sessions.  Since his last visit, pt reports that his low back pulled yesterday while he was trying to put on his tennis shoes.  He denies any radiating pain or paresthesias into his B LEs.  Standing and walking are aggravating factors but sitting is OK.  He has been taking Tylenol and using ice and a massager for his low back.  Diagnostic imaging: L-spine XR- 10/15/19  Pertinent review of systems: No fevers or chills  Relevant historical information: History of lupus and FSGS not requiring any further treatment at this point.   Exam:  BP 110/78 (BP Location: Right Arm, Patient Position: Sitting, Cuff Size: Large)   Pulse 75   Ht 6\' 5"  (1.956 m)   Wt 244 lb 3.2 oz (110.8 kg)   SpO2 96%   BMI 28.96 kg/m  General: Well Developed, well nourished, and in no acute distress.   MSK: L-spine normal-appearing to skin.  Inability to fully extend back. Nontender midline.  Tender palpation bilateral lumbar paraspinal musculature. Range of motion unable to extend to neutral position.  Limited flexion rotation and lateral flexion. Lower extremity strength reflexes and sensation are intact and equal bilaterally.     Assessment and Plan: 44 y.o. male with low back strain in the setting of prior lumbago with radiculopathy.  Fortunately does not have radicular symptoms at this time.  We will treat with physical therapy heat TENS unit.  Tizanidine prescribed as well as limited prednisone.  Consider restart Lyrica in the future if radicular symptoms return.  Check back as needed.   PDMP not reviewed this encounter. Orders Placed This Encounter  Procedures  . Ambulatory  referral to Physical Therapy    Referral Priority:   Routine    Referral Type:   Physical Medicine    Referral Reason:   Specialty Services Required    Requested Specialty:   Physical Therapy   Meds ordered this encounter  Medications  . tiZANidine (ZANAFLEX) 4 MG tablet    Sig: Take 1 tablet (4 mg total) by mouth every 6 (six) hours as needed for muscle spasms.    Dispense:  30 tablet    Refill:  1  . predniSONE (STERAPRED UNI-PAK 48 TAB) 10 MG (48) TBPK tablet    Sig: Take by mouth daily. 12 day dosepack po    Dispense:  48 tablet    Refill:  0     Discussed warning signs or symptoms. Please see discharge instructions. Patient expresses understanding.   The above documentation has been reviewed and is accurate and complete 49, M.D.

## 2020-10-20 ENCOUNTER — Encounter: Payer: Self-pay | Admitting: Family Medicine

## 2020-10-22 ENCOUNTER — Encounter: Payer: Self-pay | Admitting: Family Medicine

## 2020-10-22 NOTE — Progress Notes (Signed)
Work note revised

## 2020-11-24 NOTE — Progress Notes (Signed)
I, Austin [name], LAT, ATC, am serving as scribe for Dr. Clementeen Graham.  DELBERT [name] is a 44 y.o. male who presents to [facility] at [facility] today for f/u of LBP.  He was last seen by Dr. Denyse Amass on 10/16/20 after a flare-up of LBP when putting on his tennis shoes.  He was prescribed tizanidine and prednisone and referred to PT.  Since his last visit, pt reports back pain is varied. 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 Next day, pt was unable to get up from bed. Pt reports feeling warmth over injured region. Of note, pt has a previous dx of lupus. 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  Previous diagnosis of lupus.   Exam:  BP 128/78 (BP Location: Right Arm, Patient Position: Sitting, Cuff Size: Normal)   Ht 6\' 5"  (1.956 m)   Wt 248 lb 3.2 oz (112.6 kg)   BMI 29.43 kg/m  General: Well Developed, well nourished, and in no acute distress.   MSK: Pain normal-appearing decreased motion especially to flexion.  Normal gait.  Lower extremity strength is intact.    Lab and Radiology Results X-ray images lumbar spine obtained today personally and independently interpreted DDD and facet DJD L5-S1. Await formal radiology review    Assessment and Plan: 44 y.o. male with lumbosacral strain.  This is becoming a recurrent problem now.  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  He has been relying a bit on intermittent low-dose prednisone and tizanidine to get by.  This is a okay short-term solution but not a good long-term solution.  Plan to refill the prednisone and tizanidine.  But also get a new x-ray as his last x-rays over a year old.  Additionally will refer again to physical therapy and stressed the importance of good core  strengthening is not going to be a good long-term solution to this.  If not better plan for MRI for potential injection planning.  Check back as needed.  Work note provided.  PDMP not reviewed this encounter. Orders Placed This Encounter  Procedures  . DG Lumbar Spine 2-3 Views    Standing Status:   Future    Standing Expiration Date:   11/25/2021    Order Specific Question:   Reason for Exam (SYMPTOM  OR DIAGNOSIS REQUIRED)    Answer:   eval low back pain    Order Specific Question:   Preferred imaging location?    Answer:   11/27/2021  . Ambulatory referral to Physical Therapy    Referral Priority:   Routine    Referral Type:   Physical Medicine    Referral Reason:   Specialty Services Required    Requested Specialty:   Physical Therapy   Meds ordered this encounter  Medications  . predniSONE (STERAPRED UNI-PAK 48 TAB) 10 MG (48) TBPK tablet    Sig: Take by mouth daily. 12 day dosepack po    Dispense:  48 tablet    Refill:  0  . tiZANidine (ZANAFLEX) 4 MG tablet    Sig: Take 1 tablet (4 mg total) by mouth every 6 (six) hours as needed for muscle spasms.    Dispense:  30 tablet    Refill:  1  Discussed warning signs or symptoms. Please see discharge instructions. Patient expresses understanding.   The above documentation has been reviewed and is accurate and complete Lynne Leader, M.D.

## 2020-11-25 ENCOUNTER — Other Ambulatory Visit: Payer: Self-pay

## 2020-11-25 ENCOUNTER — Ambulatory Visit: Payer: Federal, State, Local not specified - PPO | Admitting: Family Medicine

## 2020-11-25 ENCOUNTER — Ambulatory Visit (INDEPENDENT_AMBULATORY_CARE_PROVIDER_SITE_OTHER): Payer: Federal, State, Local not specified - PPO

## 2020-11-25 VITALS — BP 128/78 | Ht 77.0 in | Wt 248.2 lb

## 2020-11-25 DIAGNOSIS — M545 Low back pain, unspecified: Secondary | ICD-10-CM

## 2020-11-25 DIAGNOSIS — S39012D Strain of muscle, fascia and tendon of lower back, subsequent encounter: Secondary | ICD-10-CM

## 2020-11-25 MED ORDER — PREDNISONE 10 MG (48) PO TBPK: ORAL_TABLET | Freq: Every day | ORAL | 0 refills | Status: DC

## 2020-11-25 MED ORDER — TIZANIDINE HCL 4 MG PO TABS: 4.0000 mg | ORAL_TABLET | Freq: Four times a day (QID) | ORAL | 1 refills | Status: DC | PRN

## 2020-11-25 NOTE — Patient Instructions (Signed)
Thank you for coming in today.  Please get an Xray today before you leave  I've referred you to Physical Therapy.  Let us know if you don't hear from them in one week.  Use the meds as needed.   Recheck as needed.

## 2020-11-26 NOTE — Progress Notes (Signed)
X-ray lumbar spine shows some arthritis at the base of the spine.

## 2021-02-25 DIAGNOSIS — Z20822 Contact with and (suspected) exposure to covid-19: Secondary | ICD-10-CM | POA: Diagnosis not present

## 2021-07-29 ENCOUNTER — Ambulatory Visit: Payer: Federal, State, Local not specified - PPO | Admitting: Family Medicine

## 2021-07-29 ENCOUNTER — Other Ambulatory Visit: Payer: Self-pay

## 2021-07-29 VITALS — Ht 77.0 in

## 2021-07-29 DIAGNOSIS — M545 Low back pain, unspecified: Secondary | ICD-10-CM

## 2021-07-29 DIAGNOSIS — G8929 Other chronic pain: Secondary | ICD-10-CM

## 2021-07-29 MED ORDER — TIZANIDINE HCL 4 MG PO TABS: 4.0000 mg | ORAL_TABLET | Freq: Four times a day (QID) | ORAL | 1 refills | Status: DC | PRN

## 2021-07-29 NOTE — Progress Notes (Signed)
I, Philbert Riser, LAT, ATC acting as a scribe for Clementeen Graham, MD.  Austin [name] is a 45 y.o. male who presents to [facility] at [facility] today for continued LBP. 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 Pt was last seen by Dr. Denyse Amass on 11/25/20 and his prednisone and tizanidine were refilled and was referred to PT, but has not completed any visits. Today, pt reports severe left low back pain.  Since the last visit he has had intermittent episodes of low back pain manageable on his own with intermittent courses of tizanidine and heating pad.  However starting 5 days ago he had severe left low back pain.  The pain does not radiate down his legs and is not associated with weakness or numbness but it is quite severe.  He has had recurrent episodes of similar pain in the past treatable with physical therapy, short course of prednisone and tizanidine.  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  Dx imaging: 11/25/20 L-spine XR  10/15/19 L-spine XR  Pertinent review of systems: No fevers or chills  Relevant historical information: Hypertension.  Lupus with history of renal failure.  And history of FSGS.  Currently well controlled.   Exam:  Ht 6\' 5"  (1.956 m)   BMI 29.43 kg/m  General: Well Developed, well nourished, and in no acute distress.   MSK: Spine nontender midline.  Tender palpation left lumbar paraspinal musculature decreased lumbar motion. Lower extremity strength is intact.    Lab and Radiology Results EXAM: LUMBAR SPINE - 2-3 VIEW   COMPARISON:  Plain films lumbar spine 10/15/2019.   FINDINGS: There is no fracture or malalignment. Intervertebral disc space height is maintained. Mild anterior endplate spurring is most notable at L3-4 and L4-5. Paraspinous structures unremarkable.   IMPRESSION: No change in mild degenerative disc disease.      Electronically Signed   By: 13/08/2019 M.D.   On: 11/25/2020 14:11   I, 11/27/2020, personally (independently) visualized and performed the interpretation of the images attached in this note.      Assessment and Plan: 45 y.o. male with acute exacerbation of chronic left low back pain.  Fortunately no radicular pain but this is now becoming chronic and repeating issue.  Plan to repeat previously effective treatment of physical therapy short course of prednisone and tizanidine.  He already has prednisone at home.  He has been completing physician directed conservative management strategies on his own for this over the last longer than 3 months but certainly over the last 3 months.  At this point reasonable to proceed with advanced imaging such as MRI to further characterize potential sources of pain and to proceed with advanced treatment options if needed such as facet injection planning.  Plan proceed with MRI and physical therapy and recheck after MRI is back.   PDMP not reviewed this encounter. Orders Placed This Encounter  Procedures   MR Lumbar Spine Wo Contrast    Standing Status:   Future    Standing Expiration Date:   07/29/2022    Order Specific Question:   What is the patient's sedation requirement?    Answer:   No Sedation    Order Specific Question:   Does the patient have a pacemaker or implanted devices?    Answer:   No    Order Specific Question:  Preferred imaging location?    Answer:   [facility] (table limit-350lbs)   Ambulatory referral to Physical Therapy    Referral Priority:   Routine    Referral Type:   Physical Medicine    Referral Reason:   Specialty Services Required    Requested Specialty:   Physical Therapy    Number of Visits Requested:   1   Meds ordered this encounter  Medications   tiZANidine (ZANAFLEX) 4 MG tablet    Sig: Take 1 tablet (4 mg total) by mouth every 6 (six) hours as needed for muscle spasms.    Dispense:  30 tablet     Refill:  1     Discussed warning signs or symptoms. Please see discharge instructions. Patient expresses understanding.   The above documentation has been reviewed and is accurate and complete Clementeen Graham, M.D.

## 2021-07-29 NOTE — Patient Instructions (Signed)
Thank you for coming in today.   I've referred you to Physical Therapy.  Let us know if you don't hear from them in one week.   You should hear from MRI scheduling within 1 week. If you do not hear please let me know.    Recheck after the MRI is back.

## 2021-08-02 ENCOUNTER — Ambulatory Visit (INDEPENDENT_AMBULATORY_CARE_PROVIDER_SITE_OTHER): Payer: Federal, State, Local not specified - PPO

## 2021-08-02 ENCOUNTER — Other Ambulatory Visit: Payer: Self-pay

## 2021-08-02 DIAGNOSIS — G8929 Other chronic pain: Secondary | ICD-10-CM | POA: Diagnosis not present

## 2021-08-02 DIAGNOSIS — M545 Low back pain, unspecified: Secondary | ICD-10-CM

## 2021-08-04 ENCOUNTER — Other Ambulatory Visit: Payer: Self-pay | Admitting: Family Medicine

## 2021-08-04 NOTE — Progress Notes (Signed)
I, Christoper Fabian, LAT, ATC, am serving as scribe for Dr. Clementeen Graham.  Austin [name] is a 45 y.o. male who presents to [facility] at [facility] today for f/u of acute-on-chronic L-sided LBP and L-spine MRI review.  He was last seen by Dr. Denyse Amass on 07/29/21 w/ recent flare of L-sided LBP and was referred for an L-spine MRI.  He was also prescribed Tizanidine and advised to use prednisone that he already had.  He was referred to PT at the [facility] location.  Today, pt reports that his L-sided LBP is getting a little better but not much change.  He needs a refill of his prednisone.  He has his first PT appt tomorrow.  Patient does have some pain radiating to his right lower leg lateral calf.  This is mild and intermittent.  No weakness bilateral lower extremities.  No bowel or bladder dysfunction.  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   MSK: L-spine nontender midline decreased lumbar motion.  Mild antalgic gait.  Lower extremity strength is intact.    Lab and Radiology Results No results found for this or any previous visit (from the past 72 hour(s)). MR Lumbar Spine Wo Contrast  Result Date: 08/03/2021 CLINICAL DATA:  Low back pain EXAM: MRI LUMBAR SPINE WITHOUT CONTRAST TECHNIQUE: Multiplanar, multisequence MR imaging of the lumbar spine was performed. No intravenous contrast was administered. COMPARISON:  Lumbar spine radiographs 11/25/2020 and 10/15/2019. FINDINGS: Segmentation: 5 non rib-bearing lumbar type vertebral bodies are present. The lowest fully  formed vertebral body is L5. Alignment: No significant listhesis is present. Lumbar lordosis preserved. Vertebrae: Marrow signal is diffusely depressed. No discrete lesions are present. Vertebral body heights are normal. Conus medullaris and cauda equina: Conus extends to the L1-2 level. Conus and cauda equina appear normal. Paraspinal and other soft tissues: Limited imaging the abdomen is unremarkable. There is no significant adenopathy. No solid organ lesions are present. Disc levels: L1-2: Negative. L2-3: Negative. L3-4: Broad-based disc protrusion extends into the foramina bilaterally. Short pedicles contribute to mild bilateral foraminal narrowing. Central canal is patent. L4-5: A broad-based disc protrusion is present. Short pedicles contribute to mild subarticular and moderate foraminal narrowing bilaterally. Narrowing is worse left than right. L5-S1: A broad-based disc protrusion is asymmetric to the right. Mild facet hypertrophy and short pedicles contribute to mild subarticular narrowing. Moderate foraminal narrowing is present bilaterally. IMPRESSION: 1. Acquired and congenital spinal stenosis as described. 2. Mild bilateral foraminal narrowing at L3-4. 3. Mild subarticular and moderate foraminal stenosis bilaterally at L4-5, left greater than right. 4. Mild subarticular and moderate foraminal narrowing bilaterally at L5-S1. Electronically Signed   By: 13/08/2019 M.D.   On: 08/03/2021 14:47    I, 08/05/2021, personally (independently) visualized and performed the interpretation of the images attached in this note.    Assessment and Plan: 45 y.o. male with chronic low back pain with acute exacerbation with mild right-sided lumbar radiculopathy at L5 dermatomal pattern.  MRI has multiple levels of potential spinal stenosis  from L3-4 to L5-S1.  Fortunately his only significant radicular symptoms are right L5 which seems to be somewhat mild. I think the main source of his current pain acute  exacerbation is muscle dysfunction which should be addressed with physical therapy which starts tomorrow.  However I do think will have some benefit with spinal intervention.  Plan to refer to epidural steroid injection.  I have given radiology discretion but I suggest probably interlaminar injection right L5-S1 as this may also have some benefit with his axial back pain.  If this does not help his axial back pain and also his lumbar radiculopathy symptoms he may have some benefit with targeted trials of injection and facet joints L5-S1 bilaterally as well.  Keep me updated with how he is doing.  We will hold off on refill prednisone for now as he is about to have a spinal injection.  Can refill prednisone in the future if needed although caution against using prednisone frequently.  Work note extended.  Out of work for least 1 to 3 weeks as he is unable to work currently due to his back pain.  Return to work when able.  We will fill out paperwork if needed.   PDMP not reviewed this encounter. Orders Placed This Encounter  Procedures   DG INJECT DIAG/THERA/INC NEEDLE/CATH/PLC EPI/LUMB/SAC W/IMG    Standing Status:   Future    Standing Expiration Date:   08/05/2022    Order Specific Question:   Reason for Exam (SYMPTOM  OR DIAGNOSIS REQUIRED)    Answer:   ESI. Axial back pain and rt L5 pain, Level and technique per radiology. Sugest interlaminar L5-S1    Order Specific Question:   Preferred Imaging Location?    Answer:   [id number]    Order Specific Question:   Radiology Contrast Protocol - do NOT remove file path    Answer:   \\[email or web address]   No orders of the defined types were placed in this encounter.    Discussed warning signs or symptoms. Please see discharge instructions. Patient expresses understanding.  Total encounter time 30 minutes including face-to-face time with the patient and, reviewing past medical record, and charting on the  date of service.   Reviewed MRI discussed treatment plan and options and return to work precautions.   The above documentation has been reviewed and is accurate and complete Clementeen Graham, M.D.

## 2021-08-04 NOTE — Progress Notes (Signed)
MRI shows spinal stenosis which may be the source of your back pain.  You do have some facet arthritis at L5-S1 which may also be the source of back pain.  There are potential targets for injection which would be the next step if not better.  Recommend return to clinic to go over the results in further detail and plan for next steps and injections.

## 2021-08-05 ENCOUNTER — Encounter: Payer: Self-pay | Admitting: Family Medicine

## 2021-08-05 ENCOUNTER — Other Ambulatory Visit: Payer: Self-pay

## 2021-08-05 ENCOUNTER — Ambulatory Visit: Payer: Federal, State, Local not specified - PPO | Admitting: Family Medicine

## 2021-08-05 VITALS — BP 102/64 | HR 73 | Ht 77.0 in | Wt 244.8 lb

## 2021-08-05 DIAGNOSIS — M5441 Lumbago with sciatica, right side: Secondary | ICD-10-CM

## 2021-08-05 DIAGNOSIS — G8929 Other chronic pain: Secondary | ICD-10-CM | POA: Diagnosis not present

## 2021-08-05 NOTE — Patient Instructions (Signed)
Thank you for coming in today.   Please call [facility] at [phone] to schedule your spine injection.    Proceed to MRI.   Let me know if you need prednisone after the injection. We dont want you taking much of it.   Proceed to PT.   Let me know how you do.

## 2021-08-07 ENCOUNTER — Ambulatory Visit (HOSPITAL_BASED_OUTPATIENT_CLINIC_OR_DEPARTMENT_OTHER): Payer: Federal, State, Local not specified - PPO | Admitting: Physical Therapy

## 2021-08-07 ENCOUNTER — Other Ambulatory Visit: Payer: Self-pay

## 2021-08-07 ENCOUNTER — Ambulatory Visit
Admission: RE | Admit: 2021-08-07 | Discharge: 2021-08-07 | Disposition: A | Discharge status: Home / Self Care | Payer: Federal, State, Local not specified - PPO | Source: Ambulatory Visit | Attending: Family Medicine | Admitting: Family Medicine

## 2021-08-07 DIAGNOSIS — M5441 Lumbago with sciatica, right side: Secondary | ICD-10-CM

## 2021-08-07 DIAGNOSIS — G8929 Other chronic pain: Secondary | ICD-10-CM

## 2021-08-07 DIAGNOSIS — M5127 Other intervertebral disc displacement, lumbosacral region: Secondary | ICD-10-CM | POA: Diagnosis not present

## 2021-08-07 MED ORDER — METHYLPREDNISOLONE ACETATE 40 MG/ML INJ SUSP (RADIOLOG
80.0000 mg | Freq: Once | INTRAMUSCULAR | Status: AC
  Administered 2021-08-07: 80 mg via EPIDURAL

## 2021-08-07 MED ORDER — IOPAMIDOL (ISOVUE-M 200) INJECTION 41%
1.0000 mL | Freq: Once | INTRAMUSCULAR | Status: AC
  Administered 2021-08-07: 1 mL via EPIDURAL

## 2021-08-07 NOTE — Discharge Instructions (Signed)

## 2021-08-12 ENCOUNTER — Other Ambulatory Visit: Payer: Self-pay

## 2021-08-12 ENCOUNTER — Ambulatory Visit (HOSPITAL_BASED_OUTPATIENT_CLINIC_OR_DEPARTMENT_OTHER): Payer: Federal, State, Local not specified - PPO | Attending: Family Medicine | Admitting: Physical Therapy

## 2021-08-12 ENCOUNTER — Encounter (HOSPITAL_BASED_OUTPATIENT_CLINIC_OR_DEPARTMENT_OTHER): Payer: Self-pay | Admitting: Physical Therapy

## 2021-08-12 DIAGNOSIS — R262 Difficulty in walking, not elsewhere classified: Secondary | ICD-10-CM | POA: Insufficient documentation

## 2021-08-12 DIAGNOSIS — M6281 Muscle weakness (generalized): Secondary | ICD-10-CM | POA: Diagnosis not present

## 2021-08-12 DIAGNOSIS — M545 Low back pain, unspecified: Secondary | ICD-10-CM | POA: Insufficient documentation

## 2021-08-12 DIAGNOSIS — M62830 Muscle spasm of back: Secondary | ICD-10-CM | POA: Insufficient documentation

## 2021-08-12 DIAGNOSIS — M5442 Lumbago with sciatica, left side: Secondary | ICD-10-CM | POA: Diagnosis not present

## 2021-08-12 NOTE — Therapy (Signed)
[facility] [address] [place], [place], [address] Phone: [phone]   Fax:  [phone]  Physical Therapy Evaluation  Patient Details  Name: Austin [name] MRN: [id number] Date of Birth: 1976/05/23 Referring Provider (PT): Dr Clementeen Graham   Encounter Date: 08/12/2021   PT End of Session - 08/12/21 1302     Visit Number 1    Number of Visits 6    Date for PT Re-Evaluation 09/23/21    PT Start Time 0845    PT Stop Time 0928    PT Time Calculation (min) 43 min    Activity Tolerance Patient tolerated treatment well    Behavior During Therapy [facility] for tasks assessed/performed             Past Medical History:  Diagnosis Date   HYPERLIPIDEMIA 02/18/2009   HYPERTENSION 02/18/2009   Lupus glomerulonephritis (HCC)    Nephritis    Pancytopenia (HCC)     Past Surgical History:  Procedure Laterality Date   RENAL BIOPSY  2003   TONSILLECTOMY      There were no vitals filed for this visit.    Subjective Assessment - 08/12/21 0849     Subjective Patient had an acute flair of lower back pain about 3 weeks ago. He had a shot on Friday and his back is feeling much better. He has been out of work for 3 weeks. He feels like he may be able to go back to work on Monday. He has to ift mail bags at work. He is also in a truck that isn't fit well for him.    Pertinent History lupus,    Limitations Standing;Walking    Diagnostic tests MRI:    Patient Stated Goals to have less pain    Currently in Pain? No/denies   No pain over the past few days.               [facility] PT Assessment - 08/12/21 0001       Assessment   Medical Diagnosis Low Back Pain    Referring Provider (PT) Dr Clementeen Graham    Onset Date/Surgical Date 07/22/21    Hand Dominance Right    Next MD Visit Nothing scheeduled    Prior Therapy Had therapy for his lower back in 2020      Precautions   Precautions None      Restrictions   Weight Bearing  Restrictions No      Balance Screen   Has the patient fallen in the past 6 months No    Has the patient had a decrease in activity level because of a fear of falling?  No    Is the patient reluctant to leave their home because of a fear of falling?  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    Leisure Going to the gym;      Cognition   Overall Cognitive Status Within Functional Limits for tasks assessed    Attention Focused    Focused Attention Appears intact    Memory Appears intact    Awareness Appears intact    Problem Solving Appears intact      Sensation   Light Touch Appears Intact    Additional Comments denies parathesias      Coordination   Gross Motor Movements are Fluid and Coordinated Yes  Fine Motor Movements are Fluid and Coordinated Yes      ROM / Strength   AROM / PROM / Strength AROM;PROM;Strength      AROM   AROM Assessment Site Lumbar    Lumbar Flexion 90 degress but pulling and minor pain at end range    Lumbar Extension 10 degrees with pain    Lumbar - Right Rotation tightness noted on the right    Lumbar - Left Rotation nroaml rotation      PROM   Overall PROM Comments gross 5/5 except left hip flexion    PROM Assessment Site Hip;Knee    Right/Left Hip Left      Strength   Strength Assessment Site Hip;Knee    Right/Left Hip Left    Left Hip Flexion 4+/5      Palpation   Palpation comment spasming in the right upper gluteal and lower lumbar spine                        Objective measurements completed on examination: See above findings.       [facility] Adult PT Treatment/Exercise - 08/12/21 0001       Lumbar Exercises: Stretches   Passive Hamstring Stretch Limitations reviewed seated 3x20 sec hold    Piriformis Stretch Limitations 3x20 sec hold    Other Lumbar Stretch Exercise tennis ball trigger point release       Lumbar Exercises: Supine   Clam Limitations x20 green    Bent Knee Raise Limitations reviewed with progression x10    Bridge Limitations x20                  Upper Extremity Functional Index Score:  /80   PT Education - 08/12/21 0856     Education Details reviewe HEP and symptom management    Person(s) Educated Patient    Methods Explanation;Demonstration;Tactile cues;Verbal cues    Comprehension Verbalized understanding;Returned demonstration;Verbal cues required;Tactile cues required                 PT Long Term Goals - 08/12/21 1454       PT LONG TERM GOAL #1   Title Patient will return to work without pain    Time 6    Period Weeks    Status New    Target Date 09/23/21      PT LONG TERM GOAL #2   Title Patient will bend to pick item off the ground with proper technique in order to work    Time 6    Period Weeks    Status New    Target Date 09/23/21      PT LONG TERM GOAL #3   Title Patient will return to gym with full strengthening program    Time 6    Period Weeks    Status New    Target Date 09/23/21      PT LONG TERM GOAL #4   Title Patient will demonstrate full lumbar flexion in order to pick items off the ground    Time 6    Period Weeks    Status New    Target Date 09/23/21                    Plan - 08/12/21 1447     Clinical Impression Statement Patient is a 45 year old male with acute right sided low back pain while stepping out of his car. He had an injection on  9/2 and sicne, has had a significant improvement in pain. He had full range of motion with mild pulling with end range flexion. he continues to have spasming in his lower lumbar spine. 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 he hoipes to go back to work on Monday of hie is able. He would benefit from skilled therapy to work on Architectural technologist and strategies to reduce flair-ups in the future.    Personal Factors and Comorbidities Profession    Examination-Activity Limitations  Locomotion Level;Squat;Stand;Sit    Examination-Participation Restrictions Occupation;Community Activity    Stability/Clinical Decision Making Stable/Uncomplicated    Clinical Decision Making Low    Rehab Potential Excellent    PT Frequency 1x / week    PT Duration 6 weeks    PT Treatment/Interventions ADLs/Self Care Home Management;Electrical Stimulation;Patient/family education;Moist Heat;Traction;Ultrasound;Manual techniques;Passive range of motion;Dry needling;Therapeutic activities;Therapeutic exercise;Neuromuscular re-education;Functional mobility training    PT Next Visit Plan If patients pain is still at a low level; review gym exercises and stretches. If he is flaired up perfrom dry needling and manual therapy.    PT Home Exercise Plan Access Code: [email or web address]  URL: [email or web address]  Date: 08/12/2021  Prepared by: Lorayne Bender    Exercises  Supine Piriformis Stretch with Foot on Ground - 1 x daily - 7 x weekly - 3 sets - 3 reps - 20 hold  Standing Glute Med Mobilization with Small Ball on Wall - 1 x daily - 7 x weekly - 3 sets - 10 reps  Seated Hamstring Stretch - 1 x daily - 7 x weekly - 3 sets - 3 reps - 20 hold  Scapular Retraction with Resistance - 1 x daily - 7 x weekly - 3 sets - 10 reps  Shoulder extension with resistance - Neutral - 1 x daily - 7 x weekly - 3 sets - 10 reps    Consulted and Agree with Plan of Care Patient             Patient will benefit from skilled therapeutic intervention in order to improve the following deficits and impairments:  Increased fascial restricitons, Pain, Decreased activity tolerance  Visit Diagnosis: Acute right-sided low back pain without sciatica  Muscle spasm of back     Problem List Patient Active Problem List   Diagnosis Date Noted   Lupus (systemic lupus erythematosus) (HCC) 10/02/2013   Hyponatremia 10/02/2013   Acute renal failure (HCC) 09/30/2013   Other pancytopenia (HCC) 09/29/2013   Anemia of chronic  disease 09/29/2013   Joint pain 09/28/2013   Nephrotic syndrome, 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

## 2021-08-12 NOTE — Patient Instructions (Signed)
Access Code: [email or web address] URL: [email or web address] Date: 08/12/2021 Prepared by: Rodrickus Veneta Sliter  Exercises Supine Piriformis Stretch with Foot on Ground - 1 x daily - 7 x weekly - 3 sets - 3 reps - 20 hold Standing Glute Med Mobilization with Small Ball on Wall - 1 x daily - 7 x weekly - 3 sets - 10 reps Seated Hamstring Stretch - 1 x daily - 7 x weekly - 3 sets - 3 reps - 20 hold Scapular Retraction with Resistance - 1 x daily - 7 x weekly - 3 sets - 10 reps Shoulder extension with resistance - Neutral - 1 x daily - 7 x weekly - 3 sets - 10 reps  

## 2021-08-19 ENCOUNTER — Ambulatory Visit (HOSPITAL_BASED_OUTPATIENT_CLINIC_OR_DEPARTMENT_OTHER): Payer: Federal, State, Local not specified - PPO | Admitting: Physical Therapy

## 2021-08-19 ENCOUNTER — Encounter (HOSPITAL_BASED_OUTPATIENT_CLINIC_OR_DEPARTMENT_OTHER): Payer: Self-pay | Admitting: Physical Therapy

## 2021-08-19 ENCOUNTER — Other Ambulatory Visit: Payer: Self-pay

## 2021-08-19 DIAGNOSIS — M62830 Muscle spasm of back: Secondary | ICD-10-CM

## 2021-08-19 DIAGNOSIS — R262 Difficulty in walking, not elsewhere classified: Secondary | ICD-10-CM | POA: Diagnosis not present

## 2021-08-19 DIAGNOSIS — M6281 Muscle weakness (generalized): Secondary | ICD-10-CM | POA: Diagnosis not present

## 2021-08-19 DIAGNOSIS — M545 Low back pain, unspecified: Secondary | ICD-10-CM | POA: Diagnosis not present

## 2021-08-19 DIAGNOSIS — M5442 Lumbago with sciatica, left side: Secondary | ICD-10-CM | POA: Diagnosis not present

## 2021-08-20 ENCOUNTER — Encounter (HOSPITAL_BASED_OUTPATIENT_CLINIC_OR_DEPARTMENT_OTHER): Payer: Self-pay | Admitting: Physical Therapy

## 2021-08-20 NOTE — Therapy (Signed)
[facility] [address] [place], [place], [address] Phone: [phone]   Fax:  [phone]  Physical Therapy Evaluation  Patient Details  Name: Austin [name] MRN: [id number] Date of Birth: January 08, 1976 Referring Provider (PT): Dr Clementeen Graham   Encounter Date: 08/19/2021   PT End of Session - 08/19/21 1608     Visit Number 2    Number of Visits 6    Date for PT Re-Evaluation 09/23/21    PT Start Time 1600    PT Stop Time 1640    PT Time Calculation (min) 40 min    Activity Tolerance Patient tolerated treatment well    Behavior During Therapy [facility] for tasks assessed/performed             Past Medical History:  Diagnosis Date   HYPERLIPIDEMIA 02/18/2009   HYPERTENSION 02/18/2009   Lupus glomerulonephritis (HCC)    Nephritis    Pancytopenia (HCC)     Past Surgical History:  Procedure Laterality Date   RENAL BIOPSY  2003   TONSILLECTOMY      There were no vitals filed for this visit.    Subjective Assessment - 08/19/21 1606     Subjective Patient reports he is back to work. He reports he sitill feels la little tightness in his back but overall it has done well. He has been wearing a brace.    Pertinent History lupus,    Limitations Standing;Walking    Diagnostic tests MRI:    Patient Stated Goals to have less pain    Currently in Pain? No/denies                            Objective measurements completed on examination: See above findings.       [facility] Adult PT Treatment/Exercise - 08/20/21 0001       Lumbar Exercises: Machines for Strengthening   Other Lumbar Machine Exercise pallof press 2x10 each direction 10 lb; chop 2x10 each direction; row 20lbs 3x10; shoulder extension 3x10 20 lbs    Other Lumbar Machine Exercise leg press 3x10 90 lbs      Manual Therapy   Manual Therapy Soft tissue mobilization;Manual Traction    Soft tissue mobilization trigger point release to lower  lumbar and upper gluteal    Manual Traction LAD grade III and IV                     PT Education - 08/19/21 1608     Education Details reviewed gym activity    Person(s) Educated Patient    Methods Explanation;Demonstration;Tactile cues;Verbal cues    Comprehension Verbalized understanding;Returned demonstration;Verbal cues required;Tactile cues required                 PT Long Term Goals - 08/12/21 1454       PT LONG TERM GOAL #1   Title Patient will return to work without pain    Time 6    Period Weeks    Status New    Target Date 09/23/21      PT LONG TERM GOAL #2   Title Patient will bend to pick item off the ground with proper technique in order to work    Time 6    Period Weeks    Status New    Target Date 09/23/21      PT LONG TERM GOAL #3   Title Patient will return to gym  with full strengthening program    Time 6    Period Weeks    Status New    Target Date 09/23/21      PT LONG TERM GOAL #4   Title Patient will demonstrate full lumbar flexion in order to pick items off the ground    Time 6    Period Weeks    Status New    Target Date 09/23/21                    Plan - 08/19/21 1608     Clinical Impression Statement Depsite low levels of pain, the patient still have moderate spasming in his lower lumbar spine and upper gluteal. He had tenderness to palpation. He had some stiffness in his back with gym exercises but no pain. If the patient continues to have stiffness and soreness in the lower back therapy will cosnder needling next visit.    Personal Factors and Comorbidities Profession    Examination-Activity Limitations Locomotion Level;Squat;Stand;Sit    Examination-Participation Restrictions Occupation;Community Activity    Stability/Clinical Decision Making Stable/Uncomplicated    Clinical Decision Making Low    Rehab Potential Excellent    PT Frequency 1x / week    PT Duration 6 weeks    PT Treatment/Interventions  ADLs/Self Care Home Management;Electrical Stimulation;Patient/family education;Moist Heat;Traction;Ultrasound;Manual techniques;Passive range of motion;Dry needling;Therapeutic activities;Therapeutic exercise;Neuromuscular re-education;Functional mobility training    PT Next Visit Plan If patients pain is still at a low level; review gym exercises and stretches. If he is flaired up perfrom dry needling and manual therapy.    PT Home Exercise Plan Access Code: [email or web address]  URL: [email or web address]  Date: 08/12/2021  Prepared by: Austin [name]    Exercises  Supine Piriformis Stretch with Foot on Ground - 1 x daily - 7 x weekly - 3 sets - 3 reps - 20 hold  Standing Glute Med Mobilization with Small Ball on Wall - 1 x daily - 7 x weekly - 3 sets - 10 reps  Seated Hamstring Stretch - 1 x daily - 7 x weekly - 3 sets - 3 reps - 20 hold  Scapular Retraction with Resistance - 1 x daily - 7 x weekly - 3 sets - 10 reps  Shoulder extension with resistance - Neutral - 1 x daily - 7 x weekly - 3 sets - 10 reps    Consulted and Agree with Plan of Care Patient             Patient will benefit from skilled therapeutic intervention in order to improve the following deficits and impairments:  Increased fascial restricitons, Pain, Decreased activity tolerance  Visit Diagnosis: Acute right-sided low back pain without sciatica  Muscle spasm of back     Problem List Patient Active Problem List   Diagnosis Date Noted   Lupus (systemic lupus erythematosus) (HCC) 10/02/2013   Hyponatremia 10/02/2013   Acute renal failure (HCC) 09/30/2013   Other pancytopenia (HCC) 09/29/2013   Anemia of chronic disease 09/29/2013   Joint pain 09/28/2013   Nephrotic syndrome, 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 [place], [place], [address] Phone: [phone]   Fax:  [phone]  Name: Austin [name] MRN: [id number] Date of Birth: Feb 17, 1976

## 2021-08-24 ENCOUNTER — Encounter: Payer: Self-pay | Admitting: Family Medicine

## 2021-08-25 ENCOUNTER — Ambulatory Visit (HOSPITAL_BASED_OUTPATIENT_CLINIC_OR_DEPARTMENT_OTHER): Payer: Federal, State, Local not specified - PPO | Admitting: Physical Therapy

## 2021-08-25 ENCOUNTER — Other Ambulatory Visit: Payer: Self-pay

## 2021-08-25 DIAGNOSIS — M6281 Muscle weakness (generalized): Secondary | ICD-10-CM | POA: Diagnosis not present

## 2021-08-25 DIAGNOSIS — R262 Difficulty in walking, not elsewhere classified: Secondary | ICD-10-CM | POA: Diagnosis not present

## 2021-08-25 DIAGNOSIS — M5442 Lumbago with sciatica, left side: Secondary | ICD-10-CM

## 2021-08-25 DIAGNOSIS — M545 Low back pain, unspecified: Secondary | ICD-10-CM | POA: Diagnosis not present

## 2021-08-25 DIAGNOSIS — M62830 Muscle spasm of back: Secondary | ICD-10-CM

## 2021-08-25 NOTE — Telephone Encounter (Signed)
Form faxed. Patient informed.

## 2021-08-26 ENCOUNTER — Encounter (HOSPITAL_BASED_OUTPATIENT_CLINIC_OR_DEPARTMENT_OTHER): Payer: Self-pay | Admitting: Physical Therapy

## 2021-08-26 NOTE — Therapy (Signed)
[facility] [address] [place], [place], [address] Phone: [phone]   Fax:  [phone]  Physical Therapy Treatment  Patient Details  Name: Austin [name] MRN: [id number] Date of Birth: 08/06/1976 Referring Provider (PT): Dr Clementeen Graham   Encounter Date: 08/25/2021   PT End of Session - 08/26/21 1159     Visit Number 3    Number of Visits 6    Date for PT Re-Evaluation 09/23/21    PT Start Time 1600    PT Stop Time 1644    PT Time Calculation (min) 44 min    Activity Tolerance Patient tolerated treatment well    Behavior During Therapy [facility] for tasks assessed/performed             Past Medical History:  Diagnosis Date   HYPERLIPIDEMIA 02/18/2009   HYPERTENSION 02/18/2009   Lupus glomerulonephritis (HCC)    Nephritis    Pancytopenia (HCC)     Past Surgical History:  Procedure Laterality Date   RENAL BIOPSY  2003   TONSILLECTOMY      There were no vitals filed for this visit.   Subjective Assessment - 08/26/21 1113     Subjective Patient reports his back got sore towards the end of the week last week at work. He has been a little sore this week. 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    Pertinent History lupus,    Limitations Standing;Walking    Diagnostic tests MRI:    Currently in Pain? 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    Multiple Pain Sites No                               [facility] Adult PT Treatment/Exercise - 08/26/21 0001       Lumbar Exercises: Machines for Strengthening   Other Lumbar Machine Exercise pallof press 2x10 each direction 10  lb; chop 2x10 each direction; row 20lbs 3x10; shoulder extension 3x10 20 lbs    Other Lumbar Machine Exercise leg press 3x10 90 lbs      Lumbar Exercises: Supine   Clam Limitations x20 Blue    Bridge Limitations x20 blue band around legs      Manual Therapy   Manual Therapy Soft tissue mobilization;Manual Traction    Manual therapy comments skilled palaption of trigger points    Soft tissue mobilization trigger point release to lower lumbar and upper gluteal    Manual Traction LAD grade III and IV              Trigger Point Dry Needling - 08/26/21 0001     Consent Given? Yes    Education Handout Provided Previously provided    Muscles Treated Back/Hip Gluteus medius;Piriformis    Other Dry Needling 2 needles in left lumbar paraspinals; and in left gluteal    Gluteus Medius Response Twitch response elicited;Palpable increased muscle length    Piriformis Response Palpable increased muscle length;Twitch response elicited  PT Education - 08/26/21 1158     Education Details benefits and risk of dry needling    Person(s) Educated Patient    Methods Explanation;Verbal cues;Tactile cues;Demonstration    Comprehension Verbalized understanding;Verbal cues required;Returned demonstration;Tactile cues required                 PT Long Term Goals - 08/12/21 1454       PT LONG TERM GOAL #1   Title Patient will return to work without pain    Time 6    Period Weeks    Status New    Target Date 09/23/21      PT LONG TERM GOAL #2   Title Patient will bend to pick item off the ground with proper technique in order to work    Time 6    Period Weeks    Status New    Target Date 09/23/21      PT LONG TERM GOAL #3   Title Patient will return to gym with full strengthening program    Time 6    Period Weeks    Status New    Target Date 09/23/21      PT LONG TERM GOAL #4   Title Patient will demonstrate full lumbar flexion in order to pick items  off the ground    Time 6    Period Weeks    Status New    Target Date 09/23/21                   Plan - 08/26/21 1340     Clinical Impression Statement Patient continues to have spasming and tenderness to palpation in the lower back and upper gluteal on the right. Therapy perfromed needling to that area today. he had a good twtich. Therapy reviewed stretches and gym exercises with him. He was advised to continue to be aware of his posutre.    Personal Factors and Comorbidities Profession    Examination-Activity Limitations Locomotion Level;Squat;Stand;Sit    Examination-Participation Restrictions Occupation;Community Activity    Stability/Clinical Decision Making Stable/Uncomplicated    Clinical Decision Making Low    Rehab Potential Excellent    PT Frequency 1x / week    PT Duration 6 weeks    PT Treatment/Interventions ADLs/Self Care Home Management;Electrical Stimulation;Patient/family education;Moist Heat;Traction;Ultrasound;Manual techniques;Passive range of motion;Dry needling;Therapeutic activities;Therapeutic exercise;Neuromuscular re-education;Functional mobility training    PT Next Visit Plan If patients pain is still at a low level; review gym exercises and stretches. If he is flaired up perfrom dry needling and manual therapy.    PT Home Exercise Plan Access Code: [email or web address]  URL: [email or web address]  Date: 08/12/2021  Prepared by: Lorayne Bender    Exercises  Supine Piriformis Stretch with Foot on Ground - 1 x daily - 7 x weekly - 3 sets - 3 reps - 20 hold  Standing Glute Med Mobilization with Small Ball on Wall - 1 x daily - 7 x weekly - 3 sets - 10 reps  Seated Hamstring Stretch - 1 x daily - 7 x weekly - 3 sets - 3 reps - 20 hold  Scapular Retraction with Resistance - 1 x daily - 7 x weekly - 3 sets - 10 reps  Shoulder extension with resistance - Neutral - 1 x daily - 7 x weekly - 3 sets - 10 reps    Consulted and Agree with Plan of Care Patient              Patient will benefit  from skilled therapeutic intervention in order to improve the following deficits and impairments:  Increased fascial restricitons, Pain, Decreased activity tolerance  Visit Diagnosis: Acute right-sided low back pain without sciatica  Muscle spasm of back  Acute left-sided low back pain with left-sided sciatica  Muscle weakness (generalized)  Difficulty in walking, not elsewhere classified     Problem List Patient Active Problem List   Diagnosis Date Noted   Lupus (systemic lupus erythematosus) (HCC) 10/02/2013   Hyponatremia 10/02/2013   Acute renal failure (HCC) 09/30/2013   Other pancytopenia (HCC) 09/29/2013   Anemia of chronic disease 09/29/2013   Joint pain 09/28/2013   Nephrotic syndrome, focal and segmental glomerular lesions 09/28/2013   Focal segmental glomerulosclerosis, 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

## 2021-09-01 ENCOUNTER — Other Ambulatory Visit: Payer: Self-pay

## 2021-09-01 ENCOUNTER — Ambulatory Visit (HOSPITAL_BASED_OUTPATIENT_CLINIC_OR_DEPARTMENT_OTHER): Payer: Federal, State, Local not specified - PPO | Admitting: Physical Therapy

## 2021-09-01 DIAGNOSIS — M6281 Muscle weakness (generalized): Secondary | ICD-10-CM

## 2021-09-01 DIAGNOSIS — M545 Low back pain, unspecified: Secondary | ICD-10-CM

## 2021-09-01 DIAGNOSIS — M5442 Lumbago with sciatica, left side: Secondary | ICD-10-CM | POA: Diagnosis not present

## 2021-09-01 DIAGNOSIS — R262 Difficulty in walking, not elsewhere classified: Secondary | ICD-10-CM | POA: Diagnosis not present

## 2021-09-01 DIAGNOSIS — M62830 Muscle spasm of back: Secondary | ICD-10-CM

## 2021-09-02 ENCOUNTER — Encounter (HOSPITAL_BASED_OUTPATIENT_CLINIC_OR_DEPARTMENT_OTHER): Payer: Self-pay | Admitting: Physical Therapy

## 2021-09-02 NOTE — Therapy (Signed)
[facility] [address] [place], [place], [address] Phone: [phone]   Fax:  [phone]  Physical Therapy Treatment  Patient Details  Name: Austin [name] MRN: [id number] Date of Birth: Mar 16, 1976 Referring Provider (PT): Dr Clementeen Graham   Encounter Date: 09/01/2021   PT End of Session - 09/02/21 1754     Visit Number 4    Number of Visits 6    Date for PT Re-Evaluation 09/23/21    PT Start Time 1600    PT Stop Time 1642    PT Time Calculation (min) 42 min    Activity Tolerance Patient tolerated treatment well    Behavior During Therapy [facility] for tasks assessed/performed             Past Medical History:  Diagnosis Date   HYPERLIPIDEMIA 02/18/2009   HYPERTENSION 02/18/2009   Lupus glomerulonephritis (HCC)    Nephritis    Pancytopenia (HCC)     Past Surgical History:  Procedure Laterality Date   RENAL BIOPSY  2003   TONSILLECTOMY      There were no vitals filed for this visit.   Subjective Assessment - 09/02/21 1747     Subjective Patient reports it still hurts when he sits, but it isn't as bad. He feels like the needling helped.    Pertinent History lupus,    Limitations Standing;Walking    Diagnostic tests MRI:    Patient Stated Goals to have less pain    Currently in Pain? 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                               [facility] Adult PT Treatment/Exercise - 09/02/21 0001       Lumbar Exercises: Machines for Strengthening   Other Lumbar Machine Exercise pallof press 2x10 each direction 10 lb; chop 2x10 each direction; row 20lbs 3x10; shoulder  extension 3x10 20 lbs    Other Lumbar Machine Exercise leg press 3x10 90 lbs      Lumbar Exercises: Supine   Clam Limitations x20 Blue    Bridge Limitations x20 blue band around legs      Manual Therapy   Manual Therapy Soft tissue mobilization;Manual Traction    Manual therapy comments skilled palaption of trigger points    Soft tissue mobilization trigger point release to lower lumbar and upper gluteal    Manual Traction LAD grade III and IV              Trigger Point Dry Needling - 09/02/21 0001     Consent Given? Yes    Education Handout Provided Previously provided    Muscles Treated Back/Hip Gluteus medius;Piriformis    Other Dry Needling 2 needles in left lumbar paraspinals; and in left gluteal    Gluteus Medius Response Twitch response elicited;Palpable increased muscle length    Piriformis Response Palpable increased muscle length;Twitch response elicited  PT Education - 09/02/21 1751     Education Details reviewed lifting technique    Person(s) Educated Patient    Methods Explanation;Demonstration;Tactile cues;Verbal cues    Comprehension Verbalized understanding;Returned demonstration;Verbal cues required;Tactile cues required                 PT Long Term Goals - 09/02/21 1757       PT LONG TERM GOAL #1   Title Patient will return to work without pain    Baseline 5/5 left    Time 6    Period Weeks    Status On-going      PT LONG TERM GOAL #2   Title Patient will bend to pick item off the ground with proper technique in order to work    Baseline no issues with sit to stand    Time 6    Period Weeks    Status On-going      PT LONG TERM GOAL #3   Title Patient will return to gym with full strengthening program    Baseline able to lift 2x10 25 lbs    Time 6    Period Weeks    Status On-going      PT LONG TERM GOAL #4   Title Patient will demonstrate full lumbar flexion in order to pick items off the ground     Baseline pain can vary    Time 6    Period Weeks    Status On-going                   Plan - 09/02/21 1755     Clinical Impression Statement Patient had less spasming in his back today. Therapy got good twiiches in his glutes and lumbar spine. Therapy reviewed lifting technoque with the patient. He has good technique. We also reviewed kettle bell swings. We will re-assess plan next visit.    Personal Factors and Comorbidities Profession    Examination-Activity Limitations Locomotion Level;Squat;Stand;Sit    Examination-Participation Restrictions Occupation;Community Activity    Stability/Clinical Decision Making Stable/Uncomplicated    Clinical Decision Making Low    Rehab Potential Excellent    PT Frequency 1x / week    PT Duration 6 weeks    PT Treatment/Interventions ADLs/Self Care Home Management;Electrical Stimulation;Patient/family education;Moist Heat;Traction;Ultrasound;Manual techniques;Passive range of motion;Dry needling;Therapeutic activities;Therapeutic exercise;Neuromuscular re-education;Functional mobility training    PT Next Visit Plan If patients pain is still at a low level; review gym exercises and stretches. If he is flaired up perfrom dry needling and manual therapy.    PT Home Exercise Plan Access Code: [email or web address]  URL: [email or web address]  Date: 08/12/2021  Prepared by: Lorayne Bender    Exercises  Supine Piriformis Stretch with Foot on Ground - 1 x daily - 7 x weekly - 3 sets - 3 reps - 20 hold  Standing Glute Med Mobilization with Small Ball on Wall - 1 x daily - 7 x weekly - 3 sets - 10 reps  Seated Hamstring Stretch - 1 x daily - 7 x weekly - 3 sets - 3 reps - 20 hold  Scapular Retraction with Resistance - 1 x daily - 7 x weekly - 3 sets - 10 reps  Shoulder extension with resistance - Neutral - 1 x daily - 7 x weekly - 3 sets - 10 reps    Consulted and Agree with Plan of Care Patient             Patient will benefit from skilled  therapeutic  intervention in order to improve the following deficits and impairments:  Increased fascial restricitons, Pain, Decreased activity tolerance  Visit Diagnosis: Acute right-sided low back pain without sciatica  Muscle spasm of back  Acute left-sided low back pain with left-sided sciatica  Muscle weakness (generalized)  Difficulty in walking, not elsewhere classified     Problem List Patient Active Problem List   Diagnosis Date Noted   Lupus (systemic lupus erythematosus) (HCC) 10/02/2013   Hyponatremia 10/02/2013   Acute renal failure (HCC) 09/30/2013   Other pancytopenia (HCC) 09/29/2013   Anemia of chronic disease 09/29/2013   Joint pain 09/28/2013   Nephrotic syndrome, focal and segmental glomerular lesions 09/28/2013   Focal segmental glomerulosclerosis, 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 [place], [place], [address] Phone: [phone]   Fax:  [phone]  Name: TRAYCE MAINO MRN: [id number] Date of Birth: [birth year]

## 2021-09-08 ENCOUNTER — Encounter (HOSPITAL_BASED_OUTPATIENT_CLINIC_OR_DEPARTMENT_OTHER): Payer: Self-pay | Admitting: Physical Therapy

## 2021-09-08 ENCOUNTER — Other Ambulatory Visit: Payer: Self-pay

## 2021-09-08 ENCOUNTER — Ambulatory Visit (HOSPITAL_BASED_OUTPATIENT_CLINIC_OR_DEPARTMENT_OTHER): Payer: Federal, State, Local not specified - PPO | Attending: Family Medicine | Admitting: Physical Therapy

## 2021-09-08 DIAGNOSIS — M6281 Muscle weakness (generalized): Secondary | ICD-10-CM | POA: Insufficient documentation

## 2021-09-08 DIAGNOSIS — M62830 Muscle spasm of back: Secondary | ICD-10-CM | POA: Insufficient documentation

## 2021-09-08 DIAGNOSIS — M545 Low back pain, unspecified: Secondary | ICD-10-CM | POA: Diagnosis not present

## 2021-09-08 DIAGNOSIS — M5442 Lumbago with sciatica, left side: Secondary | ICD-10-CM | POA: Insufficient documentation

## 2021-09-11 NOTE — Therapy (Signed)
[facility] [address] [place], [place], [address] Phone: [phone]   Fax:  [phone]  Physical Therapy Treatment/Discharge   Patient Details  Name: Austin [name] MRN: [id number] Date of Birth: Oct 05, 1976 Referring Provider (PT): Dr Lynne Leader   Encounter Date: 09/08/2021    Past Medical History:  Diagnosis Date   HYPERLIPIDEMIA 02/18/2009   HYPERTENSION 02/18/2009   Lupus glomerulonephritis ([place])    Nephritis    Pancytopenia ([place])     Past Surgical History:  Procedure Laterality Date   RENAL BIOPSY  2003   TONSILLECTOMY      There were no vitals filed for this visit.   Subjective Assessment - 09/11/21 0827     Subjective Patint is making good progress. He is having minor pain in his back at times.    Pertinent History lupus,    Limitations Standing;Walking    Diagnostic tests MRI:    Patient Stated Goals to have less pain    Currently in Pain? No/denies    Pain Onset More than a month ago                               [facility] Adult PT Treatment/Exercise - 09/11/21 0001       Lumbar Exercises: Stretches   Passive Hamstring Stretch Limitations reviewed seated 3x20 sec hold    Piriformis Stretch Limitations 3x20 sec hold    Other Lumbar Stretch Exercise tennis ball trigger point release      Lumbar Exercises: Machines for Strengthening   Other Lumbar Machine Exercise pallof press 2x10 each direction 10 lb; chop 2x10 each direction; row 20lbs 3x10; shoulder extension 3x10 20 lbs    Other Lumbar Machine Exercise leg press 3x10 90 lbs      Lumbar Exercises: Supine   Clam Limitations x20 Blue    Bridge Limitations x20 blue band around legs      Manual Therapy   Manual therapy comments assessed trigger points but none present                          PT Long Term Goals - 09/11/21 0805       PT LONG TERM GOAL #1   Title Patient will return to work without pain     Baseline no pain the last few days    Time 6    Period Weeks    Status Achieved      PT LONG TERM GOAL #2   Title Patient will bend to pick item off the ground with proper technique in order to work    Baseline able to perform    Time 6    Period Weeks    Status Achieved      PT LONG TERM GOAL #3   Title Patient will return to gym with full strengthening program    Baseline going when he can    Time 6    Period Weeks    Status Achieved      PT LONG TERM GOAL #4   Title Patient will demonstrate full lumbar flexion in order to pick items off the ground    Baseline no pain    Time 6    Period Weeks    Status Achieved      PT LONG TERM GOAL #5   Title Pt will improve his bilateral hamstring flexibliity to >/= 70 degrees to  improve mobility.    Baseline see flow sheets    Time 6    Status Achieved                   Plan - 09/11/21 0805     Clinical Impression Statement Patient is doing well. He reports the more he works on it the better it gets. He is being more aware of his llfting posture. He has been stretching every morning. He as met all goals. D/C to HEP.    Personal Factors and Comorbidities Profession    Examination-Activity Limitations Locomotion Level;Squat;Stand;Sit    Examination-Participation Restrictions Occupation;Community Activity    Stability/Clinical Decision Making Stable/Uncomplicated    Rehab Potential Excellent    PT Frequency 1x / week    PT Duration 6 weeks    PT Treatment/Interventions ADLs/Self Care Home Management;Electrical Stimulation;Patient/family education;Moist Heat;Traction;Ultrasound;Manual techniques;Passive range of motion;Dry needling;Therapeutic activities;Therapeutic exercise;Neuromuscular re-education;Functional mobility training    PT Next Visit Plan If patients pain is still at a low level; review gym exercises and stretches. If he is flaired up perfrom dry needling and manual therapy.    PT Home Exercise Plan Access  Code: [email or web address]  URL: [email or web address]  Date: 08/12/2021  Prepared by: Carolyne Littles    Exercises  Supine Piriformis Stretch with Foot on Ground - 1 x daily - 7 x weekly - 3 sets - 3 reps - 20 hold  Standing Glute Med Mobilization with Small Ball on Wall - 1 x daily - 7 x weekly - 3 sets - 10 reps  Seated Hamstring Stretch - 1 x daily - 7 x weekly - 3 sets - 3 reps - 20 hold  Scapular Retraction with Resistance - 1 x daily - 7 x weekly - 3 sets - 10 reps  Shoulder extension with resistance - Neutral - 1 x daily - 7 x weekly - 3 sets - 10 reps    Consulted and Agree with Plan of Care Patient             Patient will benefit from skilled therapeutic intervention in order to improve the following deficits and impairments:  Increased fascial restricitons, Pain, Decreased activity tolerance  Visit Diagnosis: Acute right-sided low back pain without sciatica  Muscle spasm of back  Acute left-sided low back pain with left-sided sciatica  Muscle weakness (generalized)     Problem List Patient Active Problem List   Diagnosis Date Noted   Lupus (systemic lupus erythematosus) (HCC) 10/02/2013   Hyponatremia 10/02/2013   Acute renal failure ([place]) 09/30/2013   Other pancytopenia ([place]) 09/29/2013   Anemia of chronic disease 09/29/2013   Joint pain 09/28/2013   Nephrotic syndrome, focal and segmental glomerular lesions 09/28/2013   Focal segmental glomerulosclerosis, 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 Patient goals were met. Patient is being discharged due to meeting the stated rehab goals.   Carney Living, PT 09/11/2021,  8:40 AM  [facility] [address] [place], [place], [address] Phone: [phone]   Fax:  [phone]  Name: Austin [name] MRN: [id number] Date of Birth: June 27, 1976

## 2021-12-06 IMAGING — XA Imaging study
1 series · 1 of 1 positions shown · non-contrast
Comparison: none

CLINICAL DATA: Low back and left lower extremity pain. At L4-5 , a
broad-based disc protrusion is present. mild subarticular and
moderate foraminal narrowing
Worse left than right.
L5-S1: A broad-based disc protrusion is asymmetric to the right.
Mild facet hypertrophy and short pedicles contribute to mild
subarticular narrowing. Moderate foraminal narrowing is present
bilaterally.

[Series 1: ortho adipose · 1 of 1 slices shown]
[im 1/1]
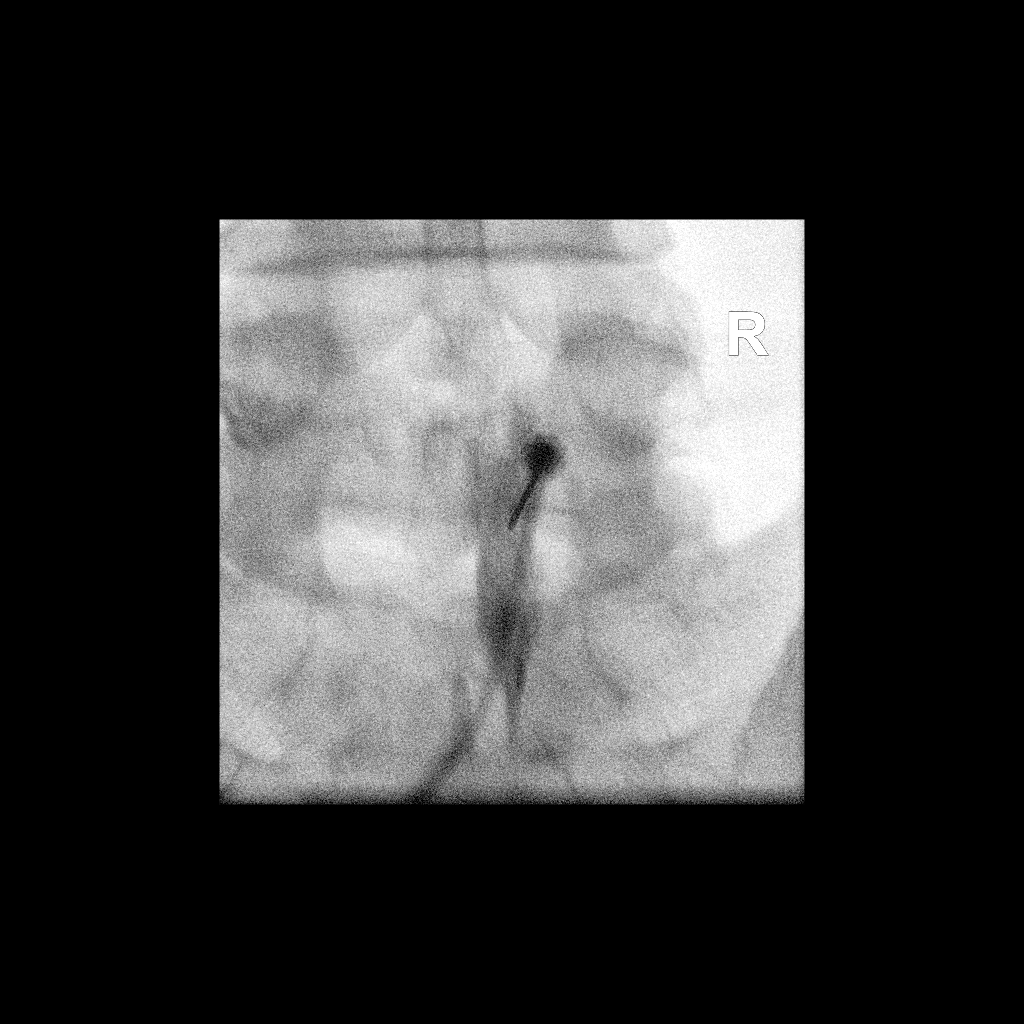

[1 of 1 positions shown; findings below may reference images not displayed]

EXAM:
LUMBAR EPIDURAL INJECTION:

DIAGNOSTIC EPIDURAL INJECTION:

THERAPEUTIC EPIDURAL INJECTION:

PROCEDURE:
The procedure, risks, benefits, and alternatives were explained to
the patient. Questions regarding the procedure were encouraged and
answered. The patient understands and consents to the procedure.

An interlaminar approach was performed on right at L5-S1. The
overlying skin was cleansed and anesthetized. A 20 gauge epidural
needle was advanced using loss-of-resistance technique.

Injection of Isovue-M 200 shows a good epidural pattern with spread
above and below the level of needle placement, primarily on the
right. No vascular opacification is seen.

80mg of Depo-Medrol mixed with 2ml lidocaine 1% were instilled. The
procedure was well-tolerated, and the patient was discharged thirty
minutes following the injection in good condition.

FLUOROSCOPY TIME:  17 seconds; 21 uUymA DAP

COMPLICATIONS:
None immediate
IMPRESSION: Technically successful epidural injection on the right at L5-S1.

## 2022-11-08 ENCOUNTER — Ambulatory Visit: Payer: Self-pay

## 2022-11-08 ENCOUNTER — Ambulatory Visit: Payer: Federal, State, Local not specified - PPO | Admitting: Family Medicine

## 2022-11-08 VITALS — BP 120/78 | HR 74 | Wt 244.0 lb

## 2022-11-08 DIAGNOSIS — G8929 Other chronic pain: Secondary | ICD-10-CM | POA: Diagnosis not present

## 2022-11-08 DIAGNOSIS — M545 Low back pain, unspecified: Secondary | ICD-10-CM

## 2022-11-08 MED ORDER — PREDNISONE 10 MG PO TABS: 30.0000 mg | ORAL_TABLET | Freq: Every day | ORAL | 0 refills | Status: DC

## 2022-11-08 MED ORDER — TIZANIDINE HCL 4 MG PO TABS: 4.0000 mg | ORAL_TABLET | Freq: Four times a day (QID) | ORAL | 1 refills | Status: DC | PRN

## 2022-11-08 NOTE — Progress Notes (Signed)
I, Austin [name], LAT, ATC acting as a scribe for Austin Graham, MD.  Austin [name] is a 46 y.o. male who presents to [facility] today for acute-on-chronic flare of his LBP. Pt was last seen by Dr. Denyse Amass on 08/05/21 and his work note was extended for 1-3 wks. Lumbar ESI was ordered and later performed on 08/07/21 and pt was referred to PT, completing 5 visits. Today, pt reports LBP returning on Thursday, 11/30, after picking up at package at work. Pt works for the [employer]. Pt locates pain to the L-side of his low back   Radiating pain: no LE numbness/tingling: no LE weakness: no Aggravates: bending forward Treatments tried: Tylenol, heat, stretch, tennis ball massage  Dx imaging: 08/02/21 L-spine MRI  11/25/20 L-spine XR  10/15/19 L-spine XR  Pertinent review of systems: No fevers or chills  Relevant historical information: Hypertension.  History of kidney disease   Exam:  BP 120/78   Pulse 74   Wt 244 lb (110.7 kg)   SpO2 97%   BMI 28.93 kg/m  General: Well Developed, well nourished, and in no acute distress.   MSK: L-spine: Nontender midline.  Tender palpation left SI joint and lumbar paraspinal musculature. Decreased lumbar motion.  Lower extremity strength is intact.    Lab and Radiology Results  Procedure: Real-time Ultrasound Guided Injection of left SI joint Device: Philips Affiniti 50G Images permanently stored and available for review in PACS Verbal informed consent obtained.  Discussed risks and benefits of procedure. Warned about infection, bleeding, hyperglycemia damage to structures among others. Patient expresses understanding and agreement Time-out conducted.   Noted no overlying erythema, induration, or other signs of local infection.   Skin prepped in a sterile fashion.   Local anesthesia: Topical Ethyl chloride.   With sterile technique and under real time ultrasound guidance: 40 mg of Kenalog and 2 mL of Marcaine  injected into SI joint. Fluid seen entering the joint capsule.   Completed without difficulty   Pain immediately resolved suggesting accurate placement of the medication.   Advised to call if fevers/chills, erythema, induration, drainage, or persistent bleeding.   Images permanently stored and available for review in the ultrasound unit.  Impression: Technically successful ultrasound guided injection.  EXAM: MRI LUMBAR SPINE WITHOUT CONTRAST   TECHNIQUE: Multiplanar, multisequence MR imaging of the lumbar spine was performed. No intravenous contrast was administered.   COMPARISON:  Lumbar spine radiographs 11/25/2020 and 10/15/2019.   FINDINGS: Segmentation: 5 non rib-bearing lumbar type vertebral bodies are present. The lowest fully formed vertebral body is L5.   Alignment: No significant listhesis is present. Lumbar lordosis preserved.   Vertebrae: Marrow signal is diffusely depressed. No discrete lesions are present. Vertebral body heights are normal.   Conus medullaris and cauda equina: Conus extends to the L1-2 level. Conus and cauda equina appear normal.   Paraspinal and other soft tissues: Limited imaging the abdomen is unremarkable. There is no significant adenopathy. No solid organ lesions are present.   Disc levels:   L1-2: Negative.   L2-3: Negative.   L3-4: Broad-based disc protrusion extends into the foramina bilaterally. Short pedicles contribute to mild bilateral foraminal narrowing. Central canal is patent.   L4-5: A broad-based disc protrusion is present. Short pedicles contribute to mild subarticular and moderate foraminal narrowing bilaterally. Narrowing is worse left than right.   L5-S1: A broad-based disc protrusion is asymmetric to the right. Mild facet hypertrophy and short pedicles contribute to mild subarticular  narrowing. Moderate foraminal narrowing is present bilaterally.   IMPRESSION: 1. Acquired and congenital spinal stenosis as  described. 2. Mild bilateral foraminal narrowing at L3-4. 3. Mild subarticular and moderate foraminal stenosis bilaterally at L4-5, left greater than right. 4. Mild subarticular and moderate foraminal narrowing bilaterally at L5-S1.     Electronically Signed   By: Marin Roberts M.D.   On: 08/03/2021 14:47   I, Austin [name], personally (independently) visualized and performed the interpretation of the images attached in this note.      Assessment and Plan: 46 y.o. male with left low back pain.  The majority the pain is think is due to a lumbar muscle strain.  He also has some SI joint related pain which we can treat with an injection in clinic today.  In the meantime tizanidine and prednisone can be helpful as well if needed.  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  Check back as needed.  Physical therapy could be helpful in the future. This is a chronic problem with an acute recurrence.  PDMP not reviewed this encounter. Orders Placed This Encounter  Procedures   [place] LIMITED JOINT SPACE STRUCTURES LOW LEFT(NO LINKED CHARGES)    Order Specific Question:   Reason for Exam (SYMPTOM  OR DIAGNOSIS REQUIRED)    Answer:   left SI joint injection    Order Specific Question:   Preferred imaging location?    Answer:   Adult nurse [facility] ordered this encounter  Medications   tiZANidine (ZANAFLEX) 4 MG tablet    Sig: Take 1 tablet (4 mg total) by mouth every 6 (six) hours as needed for muscle spasms.    Dispense:  30 tablet    Refill:  1   predniSONE (DELTASONE) 10 MG tablet    Sig: Take 3 tablets (30 mg total) by mouth daily with breakfast.    Dispense:  15 tablet    Refill:  0     Discussed warning signs or symptoms. Please see discharge instructions. Patient expresses understanding.   The above documentation has been reviewed and is accurate and complete Austin [name], M.D.

## 2022-11-08 NOTE — Patient Instructions (Signed)
Thank you for coming in today.   Call or go to the ER if you develop a large red swollen joint with extreme pain or oozing puss.    Take the prednisone starting tomorrow if needed.   Use the tizanidine muscle relaxer as needed.   Recheck as needed.   Physcal therpay could help as well.

## 2022-11-12 DIAGNOSIS — M9902 Segmental and somatic dysfunction of thoracic region: Secondary | ICD-10-CM | POA: Diagnosis not present

## 2022-11-12 DIAGNOSIS — M9901 Segmental and somatic dysfunction of cervical region: Secondary | ICD-10-CM | POA: Diagnosis not present

## 2022-11-12 DIAGNOSIS — M9905 Segmental and somatic dysfunction of pelvic region: Secondary | ICD-10-CM | POA: Diagnosis not present

## 2022-11-12 DIAGNOSIS — M9903 Segmental and somatic dysfunction of lumbar region: Secondary | ICD-10-CM | POA: Diagnosis not present

## 2022-11-20 DIAGNOSIS — M9903 Segmental and somatic dysfunction of lumbar region: Secondary | ICD-10-CM | POA: Diagnosis not present

## 2022-11-20 DIAGNOSIS — M9901 Segmental and somatic dysfunction of cervical region: Secondary | ICD-10-CM | POA: Diagnosis not present

## 2022-11-20 DIAGNOSIS — M9906 Segmental and somatic dysfunction of lower extremity: Secondary | ICD-10-CM | POA: Diagnosis not present

## 2022-11-20 DIAGNOSIS — M9905 Segmental and somatic dysfunction of pelvic region: Secondary | ICD-10-CM | POA: Diagnosis not present

## 2022-11-20 DIAGNOSIS — M9902 Segmental and somatic dysfunction of thoracic region: Secondary | ICD-10-CM | POA: Diagnosis not present

## 2022-12-03 ENCOUNTER — Observation Stay (HOSPITAL_BASED_OUTPATIENT_CLINIC_OR_DEPARTMENT_OTHER): Payer: Federal, State, Local not specified - PPO

## 2022-12-03 ENCOUNTER — Observation Stay (HOSPITAL_COMMUNITY)
Admission: EM | Admit: 2022-12-03 | Discharge: 2022-12-04 | Disposition: A | Discharge status: Home / Self Care | Payer: Federal, State, Local not specified - PPO | Attending: Internal Medicine | Admitting: Internal Medicine

## 2022-12-03 ENCOUNTER — Encounter (HOSPITAL_COMMUNITY): Payer: Self-pay | Admitting: *Deleted

## 2022-12-03 ENCOUNTER — Emergency Department (HOSPITAL_COMMUNITY): Payer: Federal, State, Local not specified - PPO

## 2022-12-03 ENCOUNTER — Observation Stay (HOSPITAL_COMMUNITY): Payer: Federal, State, Local not specified - PPO

## 2022-12-03 ENCOUNTER — Other Ambulatory Visit: Payer: Self-pay

## 2022-12-03 DIAGNOSIS — Z23 Encounter for immunization: Secondary | ICD-10-CM | POA: Insufficient documentation

## 2022-12-03 DIAGNOSIS — R52 Pain, unspecified: Secondary | ICD-10-CM

## 2022-12-03 DIAGNOSIS — M549 Dorsalgia, unspecified: Secondary | ICD-10-CM | POA: Diagnosis not present

## 2022-12-03 DIAGNOSIS — I639 Cerebral infarction, unspecified: Principal | ICD-10-CM

## 2022-12-03 DIAGNOSIS — E876 Hypokalemia: Secondary | ICD-10-CM | POA: Diagnosis not present

## 2022-12-03 DIAGNOSIS — G936 Cerebral edema: Secondary | ICD-10-CM | POA: Diagnosis not present

## 2022-12-03 DIAGNOSIS — R110 Nausea: Secondary | ICD-10-CM | POA: Diagnosis not present

## 2022-12-03 DIAGNOSIS — R42 Dizziness and giddiness: Secondary | ICD-10-CM | POA: Diagnosis not present

## 2022-12-03 DIAGNOSIS — N032 Chronic nephritic syndrome with diffuse membranous glomerulonephritis: Secondary | ICD-10-CM | POA: Diagnosis present

## 2022-12-03 DIAGNOSIS — N041 Nephrotic syndrome with focal and segmental glomerular lesions: Secondary | ICD-10-CM | POA: Diagnosis present

## 2022-12-03 DIAGNOSIS — D638 Anemia in other chronic diseases classified elsewhere: Secondary | ICD-10-CM | POA: Diagnosis present

## 2022-12-03 DIAGNOSIS — I6389 Other cerebral infarction: Secondary | ICD-10-CM | POA: Diagnosis not present

## 2022-12-03 DIAGNOSIS — I10 Essential (primary) hypertension: Secondary | ICD-10-CM | POA: Insufficient documentation

## 2022-12-03 DIAGNOSIS — R55 Syncope and collapse: Secondary | ICD-10-CM | POA: Diagnosis not present

## 2022-12-03 DIAGNOSIS — R29818 Other symptoms and signs involving the nervous system: Secondary | ICD-10-CM | POA: Diagnosis not present

## 2022-12-03 DIAGNOSIS — R112 Nausea with vomiting, unspecified: Secondary | ICD-10-CM

## 2022-12-03 DIAGNOSIS — Z1152 Encounter for screening for COVID-19: Secondary | ICD-10-CM | POA: Insufficient documentation

## 2022-12-03 DIAGNOSIS — M329 Systemic lupus erythematosus, unspecified: Secondary | ICD-10-CM | POA: Diagnosis present

## 2022-12-03 HISTORY — DX: Cerebral infarction, unspecified: I63.9

## 2022-12-03 HISTORY — DX: Hypokalemia: E87.6

## 2022-12-03 LAB — LIPASE, BLOOD: Lipase: 44 U/L (ref 11–51)

## 2022-12-03 LAB — COMPREHENSIVE METABOLIC PANEL
ALT: 17 U/L (ref 0–44)
AST: 22 U/L (ref 15–41)
Albumin: 3.3 g/dL — ABNORMAL LOW (ref 3.5–5.0)
Alkaline Phosphatase: 44 U/L (ref 38–126)
Anion gap: 4 — ABNORMAL LOW (ref 5–15)
BUN: 16 mg/dL (ref 6–20)
CO2: 23 mmol/L (ref 22–32)
Calcium: 8.4 mg/dL — ABNORMAL LOW (ref 8.9–10.3)
Chloride: 105 mmol/L (ref 98–111)
Creatinine, Ser: 1.18 mg/dL (ref 0.61–1.24)
GFR, Estimated: >60 mL/min (ref >60)
Glucose, Bld: 185 mg/dL — ABNORMAL HIGH (ref 70–99)
Potassium: 3.2 mmol/L — ABNORMAL LOW (ref 3.5–5.1)
Sodium: 132 mmol/L — ABNORMAL LOW (ref 135–145)
Total Bilirubin: 0.6 mg/dL (ref 0.3–1.2)
Total Protein: 8.7 g/dL — ABNORMAL HIGH (ref 6.5–8.1)

## 2022-12-03 LAB — MAGNESIUM: Magnesium: 1.7 mg/dL (ref 1.7–2.4)

## 2022-12-03 LAB — RAPID URINE DRUG SCREEN, HOSP PERFORMED
Amphetamines: NOT DETECTED
Barbiturates: NOT DETECTED
Benzodiazepines: NOT DETECTED
Cocaine: NOT DETECTED
Opiates: NOT DETECTED
Tetrahydrocannabinol: NOT DETECTED

## 2022-12-03 LAB — CBC WITH DIFFERENTIAL/PLATELET
Abs Immature Granulocytes: 0.02 10*3/uL (ref 0.00–0.07)
Basophils Absolute: 0 10*3/uL (ref 0.0–0.1)
Basophils Relative: 0 %
Eosinophils Absolute: 0 10*3/uL (ref 0.0–0.5)
Eosinophils Relative: 0 %
HCT: 41 % (ref 39.0–52.0)
Hemoglobin: 13.2 g/dL (ref 13.0–17.0)
Immature Granulocytes: 0 %
Lymphocytes Relative: 23 %
Lymphs Abs: 1.8 10*3/uL (ref 0.7–4.0)
MCH: 28.5 pg (ref 26.0–34.0)
MCHC: 32.2 g/dL (ref 30.0–36.0)
MCV: 88.6 fL (ref 80.0–100.0)
Monocytes Absolute: 0.5 10*3/uL (ref 0.1–1.0)
Monocytes Relative: 6 %
Neutro Abs: 5.7 10*3/uL (ref 1.7–7.7)
Neutrophils Relative %: 71 %
Platelets: 171 10*3/uL (ref 150–400)
RBC: 4.63 MIL/uL (ref 4.22–5.81)
RDW: 12.5 % (ref 11.5–15.5)
WBC: 8.1 10*3/uL (ref 4.0–10.5)
nRBC: 0 % (ref 0.0–0.2)

## 2022-12-03 LAB — RESP PANEL BY RT-PCR (RSV, FLU A&B, COVID)  RVPGX2
Influenza A by PCR: NEGATIVE
Influenza B by PCR: NEGATIVE
Resp Syncytial Virus by PCR: NEGATIVE
SARS Coronavirus 2 by RT PCR: NEGATIVE

## 2022-12-03 LAB — URINALYSIS, ROUTINE W REFLEX MICROSCOPIC
Bilirubin Urine: NEGATIVE
Glucose, UA: NEGATIVE mg/dL
Hgb urine dipstick: NEGATIVE
Ketones, ur: NEGATIVE mg/dL
Leukocytes,Ua: NEGATIVE
Nitrite: NEGATIVE
Protein, ur: >=300 mg/dL — AB
Specific Gravity, Urine: 1.018 (ref 1.005–1.030)
pH: 5 (ref 5.0–8.0)

## 2022-12-03 LAB — ANTITHROMBIN III: AntiThromb III Func: 117 % (ref 75–120)

## 2022-12-03 LAB — ECHOCARDIOGRAM COMPLETE
S' Lateral: 3.1 cm
Weight: 3904 oz

## 2022-12-03 LAB — HEMOGLOBIN A1C
Hgb A1c MFr Bld: 5.3 % (ref 4.8–5.6)
Mean Plasma Glucose: 105.41 mg/dL

## 2022-12-03 LAB — TROPONIN I (HIGH SENSITIVITY): Troponin I (High Sensitivity): 3 ng/L (ref <18)

## 2022-12-03 LAB — HIV ANTIBODY (ROUTINE TESTING W REFLEX): HIV Screen 4th Generation wRfx: NONREACTIVE

## 2022-12-03 LAB — CBG MONITORING, ED: Glucose-Capillary: 164 mg/dL — ABNORMAL HIGH (ref 70–99)

## 2022-12-03 MED ORDER — LACTATED RINGERS IV BOLUS
1000.0000 mL | Freq: Once | INTRAVENOUS | Status: AC
  Administered 2022-12-03: 1000 mL via INTRAVENOUS

## 2022-12-03 MED ORDER — SENNOSIDES-DOCUSATE SODIUM 8.6-50 MG PO TABS: 1.0000 | ORAL_TABLET | Freq: Every evening | ORAL | Status: DC | PRN

## 2022-12-03 MED ORDER — SODIUM CHLORIDE 0.9 % IV SOLN: INTRAVENOUS | Status: AC

## 2022-12-03 MED ORDER — PNEUMOCOCCAL 20-VAL CONJ VACC 0.5 ML IM SUSY
0.5000 mL | PREFILLED_SYRINGE | INTRAMUSCULAR | Status: DC
  Filled 2022-12-03: qty 0.5

## 2022-12-03 MED ORDER — ACETAMINOPHEN 650 MG RE SUPP: 650.0000 mg | RECTAL | Status: DC | PRN

## 2022-12-03 MED ORDER — STROKE: EARLY STAGES OF RECOVERY BOOK
Freq: Once | Status: AC
  Filled 2022-12-03: qty 1

## 2022-12-03 MED ORDER — ASPIRIN 81 MG PO TBEC: 81.0000 mg | DELAYED_RELEASE_TABLET | Freq: Every day | ORAL | Status: DC

## 2022-12-03 MED ORDER — ACETAMINOPHEN 160 MG/5ML PO SOLN: 650.0000 mg | ORAL | Status: DC | PRN

## 2022-12-03 MED ORDER — IOHEXOL 350 MG/ML SOLN
150.0000 mL | Freq: Once | INTRAVENOUS | Status: AC | PRN
  Administered 2022-12-03: 150 mL via INTRAVENOUS

## 2022-12-03 MED ORDER — ATORVASTATIN CALCIUM 80 MG PO TABS
80.0000 mg | ORAL_TABLET | Freq: Every day | ORAL | Status: DC
  Administered 2022-12-03 – 2022-12-04 (×2): 80 mg via ORAL
  Filled 2022-12-03: qty 1
  Filled 2022-12-03: qty 2

## 2022-12-03 MED ORDER — CLOPIDOGREL BISULFATE 75 MG PO TABS
75.0000 mg | ORAL_TABLET | Freq: Every day | ORAL | Status: DC
  Administered 2022-12-03 – 2022-12-04 (×2): 75 mg via ORAL
  Filled 2022-12-03 (×2): qty 1

## 2022-12-03 MED ORDER — LORAZEPAM 2 MG/ML IJ SOLN
0.5000 mg | Freq: Once | INTRAMUSCULAR | Status: AC
  Administered 2022-12-03: 0.5 mg via INTRAVENOUS
  Filled 2022-12-03: qty 1

## 2022-12-03 MED ORDER — ASPIRIN 81 MG PO TBEC
81.0000 mg | DELAYED_RELEASE_TABLET | Freq: Every day | ORAL | Status: DC
  Administered 2022-12-03 – 2022-12-04 (×2): 81 mg via ORAL
  Filled 2022-12-03 (×2): qty 1

## 2022-12-03 MED ORDER — ENOXAPARIN SODIUM 40 MG/0.4ML IJ SOSY
40.0000 mg | PREFILLED_SYRINGE | INTRAMUSCULAR | Status: DC
  Administered 2022-12-03: 40 mg via SUBCUTANEOUS
  Filled 2022-12-03: qty 0.4

## 2022-12-03 MED ORDER — POTASSIUM CHLORIDE CRYS ER 20 MEQ PO TBCR
40.0000 meq | EXTENDED_RELEASE_TABLET | Freq: Once | ORAL | Status: AC
  Administered 2022-12-03: 40 meq via ORAL
  Filled 2022-12-03: qty 2

## 2022-12-03 MED ORDER — ACETAMINOPHEN 325 MG PO TABS
650.0000 mg | ORAL_TABLET | ORAL | Status: DC | PRN
  Administered 2022-12-03 – 2022-12-04 (×4): 650 mg via ORAL
  Filled 2022-12-03 (×4): qty 2

## 2022-12-03 MED ORDER — INFLUENZA VAC SPLIT QUAD 0.5 ML IM SUSY
0.5000 mL | PREFILLED_SYRINGE | INTRAMUSCULAR | Status: AC
  Administered 2022-12-04: 0.5 mL via INTRAMUSCULAR
  Filled 2022-12-03: qty 0.5

## 2022-12-03 MED ORDER — ONDANSETRON HCL 4 MG/2ML IJ SOLN
4.0000 mg | Freq: Once | INTRAMUSCULAR | Status: AC
  Administered 2022-12-03: 4 mg via INTRAVENOUS
  Filled 2022-12-03: qty 2

## 2022-12-03 NOTE — ED Notes (Signed)
Patient with Vascular

## 2022-12-03 NOTE — ED Notes (Signed)
Help get patient on the monitor patient is resting with call bell in reach 

## 2022-12-03 NOTE — ED Notes (Signed)
Pt to MRI

## 2022-12-03 NOTE — Assessment & Plan Note (Addendum)
46 year male with history of possible lupus nephritis presenting with acute onset of dizziness, gait instability, N/V found to have acute cerebellar CVA -place in observation on telemetry for  stroke work-up -Neurochecks per protocol -Neurology consulted -echo -lipid and A1C  -hypercoag w/u including LE dopplers -lupus work up: ANA/ds DNA  -CTA head/neck  -ASA 81 mg daily and plavix 75mg  daily per neurology -start high dose statin  -Permissive hypertension first 24 hours <220/110 -N.p.o. until bedside swallow screen -PT/ OT consult

## 2022-12-03 NOTE — Assessment & Plan Note (Signed)
Had 2 renal biopsies done in 2014 and 2017.  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 Was on plaquanil but gave him SE and stopped using.  Will need to set back up outpatient

## 2022-12-03 NOTE — ED Notes (Signed)
Patient in CT

## 2022-12-03 NOTE — Assessment & Plan Note (Signed)
Potassium 3.2 with  normal magnesium Give potassium x 1 Trend

## 2022-12-03 NOTE — Evaluation (Signed)
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 PMH includes lupus, HTN, HLD.  Clinical Impression   Pt presents with headache-type pain, min nausea that comes and goes, but otherwise strength, coordination, balance, and activity tolerance is WFL. Pt ambulated hallway distance without AD and independently, increased time only, with no episodes of acute dizziness with directional changes, head turns, or change in gait speed. PT reviewed s/s of CVA with BE FAST principle, pt and family with no further questions. PT to sign off given mod I level of mobility, please reconsult if needed.      Recommendations for follow up therapy are one component of a multi-disciplinary discharge planning process, led by the attending physician.  Recommendations may be updated based on patient status, additional functional criteria and insurance authorization.  Follow Up Recommendations No PT follow up      Assistance Recommended at Discharge PRN  Patient can return home with the following       Equipment Recommendations None recommended by PT  Recommendations for Other Services       Functional Status Assessment Patient has not had a recent decline in their functional status     Precautions / Restrictions Precautions Precautions: Fall Restrictions Weight Bearing Restrictions: No      Mobility  Bed Mobility Overal bed mobility: Needs Assistance Bed Mobility: Supine to Sit, Sit to Supine     Supine to sit: Modified independent (Device/Increase time) Sit to supine: Modified independent (Device/Increase time)        Transfers Overall transfer level: Modified independent                      Ambulation/Gait Ambulation/Gait assistance: Modified independent (Device/Increase  time) Gait Distance (Feet): 300 Feet Assistive device: None Gait Pattern/deviations: Step-through pattern, WFL(Within Functional Limits) Gait velocity: wfl     General Gait Details: initially slowed, progressing to [facility] speed. No gait deviation with head turns, directional changed  Stairs            Wheelchair Mobility    Modified Rankin (Stroke Patients Only) Modified Rankin (Stroke Patients Only) Pre-Morbid Rankin Score: No symptoms Modified Rankin: No significant disability     Balance Overall balance assessment: Modified Independent                                           Pertinent Vitals/Pain Pain Assessment Pain Assessment: Faces Faces Pain Scale: Hurts little more Pain Location: back, head Pain Descriptors / Indicators: Sore, Headache Pain Intervention(s): Limited activity within patient's tolerance, Monitored during session, Repositioned    Home Living Family/patient expects to be discharged to:: Private residence Living Arrangements: Spouse/significant other;Children Available Help at Discharge: Family Type of Home: House Home Access: Stairs to enter   Secretary/administrator of Steps: 2   Home Layout: Bed/bath upstairs Home Equipment: None      Prior Function Prior Level of Function : Independent/Modified Independent             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)       Hand Dominance   Dominant Hand: Right    Extremity/Trunk Assessment   Upper Extremity Assessment Upper Extremity Assessment: Defer to OT evaluation  Lower Extremity Assessment Lower Extremity Assessment: Overall WFL for tasks assessed (no focal weakness, no ataxia)    Cervical / Trunk Assessment Cervical / Trunk Assessment: Normal  Communication   Communication: No difficulties  Cognition Arousal/Alertness: Awake/alert Behavior During Therapy: WFL for tasks assessed/performed Overall Cognitive Status: Within  Functional Limits for tasks assessed                                          General Comments      Exercises     Assessment/Plan    PT Assessment Patient does not need any further PT services  PT Problem List         PT Treatment Interventions      PT Goals (Current goals can be found in the Care Plan section)  Acute Rehab PT Goals Patient Stated Goal: home PT Goal Formulation: With patient Time For Goal Achievement: 12/03/22 Potential to Achieve Goals: Good    Frequency       Co-evaluation               AM-PAC PT "6 Clicks" Mobility  Outcome Measure Help needed turning from your back to your side while in a flat bed without using bedrails?: None Help needed moving from lying on your back to sitting on the side of a flat bed without using bedrails?: None Help needed moving to and from a bed to a chair (including a wheelchair)?: None Help needed standing up from a chair using your arms (e.g., wheelchair or bedside chair)?: None Help needed to walk in hospital room?: None Help needed climbing 3-5 steps with a railing? : None 6 Click Score: 24    End of Session   Activity Tolerance: Patient tolerated treatment well Patient left: in bed;with call bell/phone within reach Nurse Communication: Mobility status PT Visit Diagnosis: Other abnormalities of gait and mobility (R26.89)    Time: 4854-6270 PT Time Calculation (min) (ACUTE ONLY): 17 min   Charges:   PT Evaluation $PT Eval Low Complexity: 1 Low        Marye Round, PT DPT Acute Rehabilitation Services Pager [phone]  Office [phone]   Tyrone Apple E Christain [place] 12/03/2022, 5:19 PM

## 2022-12-03 NOTE — ED Notes (Signed)
EDP in to see, at [facility]. Family at [facility].

## 2022-12-03 NOTE — Consult Note (Addendum)
Neurology Consultation  Reason for Consult: Stroke Referring Physician: Dr. Lajean Saver  CC: Dizziness  History is obtained from: Chart, patient  HPI: Austin [name] is a 46 y.o. male past medical history of hypertension, hyperlipidemia-not on medications, history of glomerulonephritis with concern for nephrotic syndrome versus lupus who was on chronic prednisone for a while and has been off of it, presenting to the emergency room for evaluation of sudden onset of dizziness that was noted upon waking up this morning.  His last known well was when he went to bed last night and woke up 4 AM and noticed his symptoms upon waking up.  Says he was trying to get up from the chair and almost fell that he was in a lot of disequilibrium and swerving to the left.  He felt the room spinning around him.  He had to sit down and symptoms somewhat resolved.  He has had some headaches with pain on the left temple and seeing some flashes of lights or sores of light on the left for which she takes Tylenol.  Today he had a lot of nausea which she has never had with his headaches.  All of the symptoms made him come to the emergency room for further evaluation.  Not much of abnormalities on physical exam but because of his neurological complaints an MRI was done which showed a cerebellar infarct-see details below. Neurology was consulted for further recommendations He says that he had kidney problems in 2007 and 2013 and there was concern for lupus nephritis he followed with a rheumatologist who was not very convinced that the diagnosis was confirmed lupus.  He has not been on chronic prednisone for a while.   LKW: 10 PM 12/02/2022 IV thrombolysis given?: no, outside the windo Premorbid modified Rankin scale (mRS): 0-fully functional, [employer] mail [employer].  ROS: Full ROS was performed and is negative except as noted in the HPI.   Past Medical History:  Diagnosis Date   HYPERLIPIDEMIA 02/18/2009   HYPERTENSION  02/18/2009   Lupus glomerulonephritis ([place])    Nephritis    Pancytopenia ([place])      Family History  Problem Relation Age of Onset   Hypertension Maternal Grandmother    Diabetes Maternal Grandmother    Hypertension Maternal Grandfather    Cancer Maternal Grandfather        prostate   Hyperlipidemia Father      Social History:   reports that he has never smoked. He has never used smokeless tobacco. He reports current alcohol use. He reports that he does not use drugs.  Medications No current facility-administered medications for this encounter.  Current Outpatient Medications:    acetaminophen (TYLENOL) 500 MG tablet, Take 1,000 mg by mouth every 6 (six) hours as needed for moderate pain or headache., Disp: , Rfl:    predniSONE (DELTASONE) 10 MG tablet, Take 3 tablets (30 mg total) by mouth daily with breakfast., Disp: 15 tablet, Rfl: 0   tiZANidine (ZANAFLEX) 4 MG tablet, Take 1 tablet (4 mg total) by mouth every 6 (six) hours as needed for muscle spasms., Disp: 30 tablet, Rfl: 1  Exam: Current vital signs: BP 122/80 (BP Location: Right Arm)   Pulse 83   Resp 16   Wt 110.7 kg   SpO2 100%   BMI 28.93 kg/m  Vital signs in last 24 hours: Pulse Rate:  [67-91] 83 (12/29 1245) Resp:  [12-21] 16 (12/29 1230) BP: (96-125)/(43-80) 122/80 (12/29 1230) SpO2:  [96 %-100 %] 100 % (  12/29 1245) Weight:  [110.7 kg] 110.7 kg (12/29 0915) GENERAL: Awake, alert in NAD HEENT: - Normocephalic and atraumatic, dry mm, no LN++, no Thyromegally LUNGS - Clear to auscultation bilaterally with no wheezes CV - S1S2 RRR, no m/r/g, equal pulses bilaterally. ABDOMEN - Soft, nontender, nondistended with normoactive BS Ext: warm, well perfused, intact peripheral pulses, no edema  NEURO:  Mental Status: AA&Ox3  Language: speech is nondysarthric.  Naming, repetition, fluency, and comprehension intact. Cranial Nerves: PERRL EOMI visual fields full, no facial asymmetry, facial sensation intact,  hearing intact, tongue/uvula/soft palate midline, normal sternocleidomastoid and trapezius muscle strength. No evidence of tongue atrophy or fibrillations.  No nystagmus Motor: No drift in all fours Tone: is normal and bulk is normal Sensation- Intact to light touch bilaterally Coordination: FTN intact bilaterally, no ataxia in BLE. Gait- deferred NIHSS--0  Labs I have reviewed labs in epic and the results pertinent to this consultation are:  CBC    Component Value Date/Time   WBC 8.1 12/03/2022 0830   RBC 4.63 12/03/2022 0830   HGB 13.2 12/03/2022 0830   HCT 41.0 12/03/2022 0830   PLT 171 12/03/2022 0830   MCV 88.6 12/03/2022 0830   MCH 28.5 12/03/2022 0830   MCHC 32.2 12/03/2022 0830   RDW 12.5 12/03/2022 0830   LYMPHSABS 1.8 12/03/2022 0830   MONOABS 0.5 12/03/2022 0830   EOSABS 0.0 12/03/2022 0830   BASOSABS 0.0 12/03/2022 0830    CMP     Component Value Date/Time   NA 132 (L) 12/03/2022 0830   K 3.2 (L) 12/03/2022 0830   CL 105 12/03/2022 0830   CO2 23 12/03/2022 0830   GLUCOSE 185 (H) 12/03/2022 0830   BUN 16 12/03/2022 0830   CREATININE 1.18 12/03/2022 0830   CALCIUM 8.4 (L) 12/03/2022 0830   PROT 8.7 (H) 12/03/2022 0830   ALBUMIN 3.3 (L) 12/03/2022 0830   AST 22 12/03/2022 0830   ALT 17 12/03/2022 0830   ALKPHOS 44 12/03/2022 0830   BILITOT 0.6 12/03/2022 0830   GFRNONAA >60 12/03/2022 0830   GFRAA 61 (L) 10/02/2013 0745   Previous lab studies indicative of Positive ANA and dsDNA and his prior biopsies are consistent with lupus nephritis  Imaging I have reviewed the images obtained:  MRI brain-left inferior medial cerebellar stroke.  Mild surrounding edema.  No mass effect.  Assessment: 46 year old with prior history of possible lupus, hypertension hyperlipidemia not on any medications presenting for sudden onset of dizziness and MRI imaging consistent with a left inferior medial cerebellar stroke, with mild edema and no mass effect. Given prior  history of lupus, could be hypercoagulable from SLE. Needs full stroke workup including hypercoagulable panel.   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 Blood pressures can be gradually normalized to SBP<140 upon discharge. -MRI brain without contrast -CT Angiogram of Head and neck -Echocardiogram -HgbA1c, fasting lipid panel -Hypercoagulable panel. -May need consideration for TEE or outpatient cardiac monitoring-defer to stroke team rounding -Frequent neuro checks -Prophylactic therapy-Antiplatelet med: Aspirin 81+ Plavix 75. -Atorvastatin 80 mg PO daily -Risk factor modification -PT consult, OT consult, Speech consult -LE dopplers  Discussed with Dr. Artis Flock, admitting hospitalist  Please page stroke NP/PA/MD (listed on AMION)  from 8am-4 pm  as this patient will be followed by the stroke team at this point.   -- Amie Portland, MD Neurologist [facility] Pager: [phone]

## 2022-12-03 NOTE — H&P (Signed)
History and Physical    Patient: Austin [name] HFW:[id number] DOB: [birth year] DOA: 12/03/2022 DOS: the patient was seen and examined on 12/03/2022 PCP: Kristian Covey, MD  Patient coming from: Home - lives with his wife, kids and mother in law.    Chief Complaint: dizziness N/V   HPI: Austin [name] is a 46 y.o. male with medical history significant of SLE, nephrotic syndrome with acute onset of dizziness around 4:30AM. He states the spinning did not stop until around 10AM. He had associated nausea and one episode of emesis. He states his gait was abnormal and ataxic in a sense, drifting to the left.  He had a headache on the left side of his head.  He denies any vision changes, slurred speech, focal deficits or vision changes. He denies any extremity weakness.   He thinks his maternal grandmother had a CVA.    Denies any fever/chills, vision changes/headaches, chest pain or palpitations, shortness of breath or cough, abdominal pain, diarrhea, dysuria or leg swelling.   Had 2 renal biopsies done in 2014 and 2017.  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 Was on plaquanil but gave him SE and stopped using.   He does not smoke or drink alcohol.   ER Course:  vitals: afebrile, bp: 110/57, HR: 67, RR: 16, oxygen: 98%RA Pertinent labs: potassium: 3.2, sodium: 132,  CT head: no acute findings MRI brain: Acute inferomedial left cerebellar infarct. Associated edema without significant mass effect. In ED: given 1L IVF, zofran, ativan, neurology consulted. [facility] asked to admit.    Review of Systems: As mentioned in the history of present illness. All other systems reviewed and are negative. Past Medical History:  Diagnosis Date   HYPERLIPIDEMIA 02/18/2009   HYPERTENSION 02/18/2009   Lupus  glomerulonephritis ([facility])    Nephritis    Pancytopenia ([facility])    Past Surgical History:  Procedure Laterality Date   RENAL BIOPSY  2003   TONSILLECTOMY     Social History:  reports that he has never smoked. He has never used smokeless tobacco. He reports current alcohol use. He reports that he does not use drugs.  No Known Allergies  Family History  Problem Relation Age of Onset   Hypertension Maternal Grandmother    Diabetes Maternal Grandmother    Hypertension Maternal Grandfather    Cancer Maternal Grandfather        prostate   Hyperlipidemia Father     Prior to Admission medications   Medication Sig Start Date End Date Taking? Authorizing Provider  acetaminophen (TYLENOL) 500 MG tablet Take 1,000 mg by mouth every 6 (six) hours as needed for moderate pain or headache.    [provider]  predniSONE (DELTASONE) 10 MG tablet Take 3 tablets (30 mg total) by mouth daily with breakfast. 11/08/22   Rodolph Bong, MD  tiZANidine (ZANAFLEX) 4 MG tablet Take 1 tablet (4 mg total) by mouth every 6 (six) hours as needed for muscle spasms. 11/08/22   Rodolph Bong, MD    Physical Exam: Vitals:   12/03/22 1240 12/03/22 1245 12/03/22 1300 12/03/22 1330  BP:   125/88 126/86  Pulse: 83 83 82 82  Resp:      SpO2: 100% 100% 100% 100%  Weight:       General:  Appears calm and  comfortable and is in NAD Eyes:  PERRL, EOMI, normal lids, iris ENT:  grossly normal hearing, lips & tongue, mmm; appropriate dentition Neck:  no LAD, masses or thyromegaly; no carotid bruits Cardiovascular:  RRR, no m/r/g. No LE edema.  Respiratory:   CTA bilaterally with no wheezes/rales/rhonchi.  Normal respiratory effort. Abdomen:  soft, NT, ND, NABS Back:   normal alignment, no CVAT Skin:  no rash or induration seen on limited exam Musculoskeletal:  grossly normal tone BUE/BLE, good ROM, no bony abnormality Lower extremity:  No LE edema.  Limited foot exam with no ulcerations.  2+ distal  pulses. Psychiatric:  grossly normal mood and affect, speech fluent and appropriate, AOx3 Neurologic:  CN 2-12 grossly intact, moves all extremities in coordinated fashion, sensation intact. HTK intact bilaterally, FTN intact bilaterally. Negative pronator drift. Gait deferred. DTR 2+    Radiological Exams on Admission: Independently reviewed - see discussion in A/P where applicable  MR BRAIN WO CONTRAST  Result Date: 12/03/2022 CLINICAL DATA:  Syncope/presyncope, cerebrovascular cause suspected acute dizziness/unsteadiness, r/o cva EXAM: MRI HEAD WITHOUT CONTRAST TECHNIQUE: Multiplanar, multiecho pulse sequences of the brain and surrounding structures were obtained without intravenous contrast. COMPARISON:  CT head from the same day. FINDINGS: Brain: Acute inferomedial left cerebellar infarct. Associated edema without significant mass effect. No evidence of acute hemorrhage, mass lesion, midline shift or hydrocephalus. Vascular: Major arterial flow voids are a skull base. Skull and upper cervical spine: Normal marrow signal. Sinuses/Orbits: Opacified anterior right ethmoid air cell. No acute orbital findings. Other: No mastoid effusions. IMPRESSION: Acute inferomedial left cerebellar infarct. Associated edema without significant mass effect. Electronically Signed   By: Feliberto Harts M.D.   On: 12/03/2022 11:57   CT Head Wo Contrast  Result Date: 12/03/2022 CLINICAL DATA:  Syncope/presyncope, cerebrovascular cause suspected. Dizziness. EXAM: CT HEAD WITHOUT CONTRAST TECHNIQUE: Contiguous axial images were obtained from the base of the skull through the vertex without intravenous contrast. RADIATION DOSE REDUCTION: This exam was performed according to the departmental dose-optimization program which includes automated exposure control, adjustment of the mA and/or kV according to patient size and/or use of iterative reconstruction technique. COMPARISON:  Head CT 10/28/2003 FINDINGS: Brain: There is  no evidence of an acute infarct, intracranial hemorrhage, mass, midline shift, or extra-axial fluid collection. The ventricles and sulci are normal. Vascular: No hyperdense vessel. Skull: No fracture or suspicious osseous lesion. Sinuses/Orbits: Mild mucosal thickening in the paranasal sinuses. Clear mastoid air cells. Unremarkable orbits. Other: None. IMPRESSION: Unremarkable CT appearance of the brain. Electronically Signed   By: Sebastian Ache M.D.   On: 12/03/2022 09:50    EKG: Independently reviewed.  NSR with rate 69; nonspecific ST changes with no evidence of acute ischemia   Labs on Admission: I have personally reviewed the available labs and imaging studies at the time of the admission.  Pertinent labs:   potassium: 3.2,  sodium: 132,  Assessment and Plan: Principal Problem:   Acute CVA (cerebrovascular accident) ([facility]) Active Problems:   Hypokalemia   Lupus (systemic lupus erythematosus) ([facility])    Assessment and Plan: * Acute CVA (cerebrovascular accident) ([facility]) 46 year male with history of possible lupus nephritis presenting with acute onset of dizziness, gait instability, N/V found to have acute cerebellar CVA -place in observation on telemetry for  stroke work-up -Neurochecks per protocol -Neurology consulted -echo -lipid and A1C  -hypercoag w/u including LE dopplers -lupus work up: ANA/ds DNA  -CTA head/neck  -ASA 81 mg daily and plavix 75mg  daily per  neurology -start high dose statin  -Permissive hypertension first 24 hours <220/110 -N.p.o. until bedside swallow screen -PT/ OT consult   Hypokalemia Potassium 3.2 with  normal magnesium Give potassium x 1 Trend   Lupus (systemic lupus erythematosus) ([facility]) Had 2 renal biopsies done in 2014 and 2017.  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 Was on plaquanil but gave him SE and stopped using.  Will need to set back up outpatient     Advance Care Planning:   Code Status: Full Code   Consults: neurology   DVT Prophylaxis: lovenox   Family Communication: wife at bedside   Severity of Illness: The appropriate patient status for this patient is OBSERVATION. Observation status is judged to be reasonable and necessary in order to provide the required intensity of service to ensure the patient's safety. The patient's presenting symptoms, physical exam findings, and initial radiographic and laboratory data in the context of their medical condition is felt to place them at decreased risk for further clinical deterioration. Furthermore, it is anticipated that the patient will be medically stable for discharge from the hospital within 2 midnights of admission.   Author: Orland Mustard, MD 12/03/2022 3:24 PM  For on call review [email or web address].

## 2022-12-03 NOTE — Progress Notes (Signed)
Bilateral lower extremity venous duplex has been completed. Preliminary results can be found in CV Proc through chart review.   12/03/22 3:45 PM Olen Cordial RVT

## 2022-12-03 NOTE — ED Triage Notes (Signed)
BIB [facility] from home s/p syncope and fall preceded by dizziness and NV. Describes vomiting as dry heaves and mucus. C/c is positional dizziness worse with movement. Prefers LL Recumbant poosition. CBG 199.h/o lupus. Also endorses photo sensitive. Family heard pt fall and hit wall. Denies injury or pain. Just pain from vomiting. Endorses leg cramps for last 2 weeks. Denies diarrhea, fever, known sick contacts. Alert, NAD, calm, interactive, skin cool and dry.

## 2022-12-03 NOTE — ED Notes (Addendum)
"  Feeling better", "nausea resolved, dizziness improved". To CT, given eye mask per request

## 2022-12-03 NOTE — ED Notes (Signed)
EDP at BS 

## 2022-12-03 NOTE — ED Notes (Signed)
Back from MRI. Pt is hungry and wants to eat.

## 2022-12-03 NOTE — Progress Notes (Signed)
  Echocardiogram 2D Echocardiogram has been performed.  Delcie Roch 12/03/2022, 3:54 PM

## 2022-12-03 NOTE — ED Provider Notes (Signed)
[facility] EMERGENCY DEPARTMENT Provider Note   CSN: [id number] Arrival date & time: 12/03/22  0805     History  Chief Complaint  Patient presents with   Dizziness    Austin [name] is a 46 y.o. male.  Pt with c/o nausea, vomiting, dizziness, acute onset around 4 AM. States felt normal yesterday, went to bed last evening per normal. Mild congestion and body aches. No cough or sob. Mild headache. No acute, abrupt or severe head pain. Describes dizziness as lightheaded feeling but also room spinning sensation. Denies hx vertigo. No hearing loss or tinnitus. No specific known ill contacts. Denies chest pain or discomfort. No palpitations. No sob or unusual doe. No abd pain. No dysuria or gu c/o. No extremity pain or swelling. Felt faint/lightheaded, and shaky, leaned up against wall - denies trauma/injury. No change in speech or vision. No numbness/weakness.   The history is provided by the patient, medical records, the EMS personnel and a significant other.  Dizziness Associated symptoms: nausea and vomiting   Associated symptoms: no chest pain, no diarrhea, no palpitations, no shortness of breath and no weakness        Home Medications Prior to Admission medications   Medication Sig Start Date End Date Taking? Authorizing Provider  acetaminophen (TYLENOL) 500 MG tablet Take 1,000 mg by mouth every 6 (six) hours as needed for moderate pain or headache.    [provider]  predniSONE (DELTASONE) 10 MG tablet Take 3 tablets (30 mg total) by mouth daily with breakfast. 11/08/22   Rodolph Bong, MD  tiZANidine (ZANAFLEX) 4 MG tablet Take 1 tablet (4 mg total) by mouth every 6 (six) hours as needed for muscle spasms. 11/08/22   Rodolph Bong, MD      Allergies    Patient has no known allergies.    Review of Systems   Review of Systems  Constitutional:  Negative for chills and fever.  HENT:  Positive for congestion.   Eyes:  Negative for pain, redness  and visual disturbance.  Respiratory:  Negative for cough and shortness of breath.   Cardiovascular:  Negative for chest pain, palpitations and leg swelling.  Gastrointestinal:  Positive for nausea and vomiting. Negative for abdominal pain and diarrhea.  Genitourinary:  Negative for dysuria and flank pain.  Musculoskeletal:  Negative for back pain and neck pain.  Skin:  Negative for rash.  Neurological:  Positive for dizziness and light-headedness. Negative for speech difficulty, weakness and numbness.  Hematological:  Does not bruise/bleed easily.  Psychiatric/Behavioral:  Negative for confusion.     Physical Exam Updated Vital Signs SpO2 98%  Physical Exam Vitals and nursing note reviewed.  Constitutional:      Appearance: Normal appearance. He is well-developed.  HENT:     Head: Atraumatic.     Right Ear: Tympanic membrane normal.     Left Ear: Tympanic membrane normal.     Nose: Nose normal.     Mouth/Throat:     Mouth: Mucous membranes are moist.     Pharynx: Oropharynx is clear.  Eyes:     General: No scleral icterus.    Conjunctiva/sclera: Conjunctivae normal.     Pupils: Pupils are equal, round, and reactive to light.  Neck:     Vascular: No carotid bruit.     Trachea: No tracheal deviation.  Cardiovascular:     Rate and Rhythm: Normal rate and regular rhythm.     Pulses: Normal pulses.  Heart sounds: Normal heart sounds. No murmur heard.    No friction rub. No gallop.  Pulmonary:     Effort: Pulmonary effort is normal. No accessory muscle usage or respiratory distress.     Breath sounds: Normal breath sounds.  Abdominal:     General: Bowel sounds are normal. There is no distension.     Palpations: Abdomen is soft.     Tenderness: There is no abdominal tenderness. There is no guarding.  Genitourinary:    Comments: No cva tenderness. Musculoskeletal:        General: No swelling.     Cervical back: Normal range of motion and neck supple. No rigidity.      Comments: CTLS spine, non tender, aligned, no step off. Good rom bil extremities without pain or focal bony tenderness.   Skin:    General: Skin is warm and dry.     Findings: No rash.  Neurological:     Mental Status: He is alert.     Comments: Alert, speech clear. Motor/sens grossly intact bil.   Psychiatric:        Mood and Affect: Mood normal.     ED Results / Procedures / Treatments   Labs (all labs ordered are listed, but only abnormal results are displayed) Results for orders placed or performed during the hospital encounter of 12/03/22  Resp panel by RT-PCR (RSV, Flu A&B, Covid) Anterior Nasal Swab   Specimen: Anterior Nasal Swab  Result Value Ref Range   SARS Coronavirus 2 by RT PCR NEGATIVE NEGATIVE   Influenza A by PCR NEGATIVE NEGATIVE   Influenza B by PCR NEGATIVE NEGATIVE   Resp Syncytial Virus by PCR NEGATIVE NEGATIVE  CBC WITH DIFFERENTIAL  Result Value Ref Range   WBC 8.1 4.0 - 10.5 K/uL   RBC 4.63 4.22 - 5.81 MIL/uL   Hemoglobin 13.2 13.0 - 17.0 g/dL   HCT 16.1 09.6 - 04.5 %   MCV 88.6 80.0 - 100.0 fL   MCH 28.5 26.0 - 34.0 pg   MCHC 32.2 30.0 - 36.0 g/dL   RDW 40.9 81.1 - 91.4 %   Platelets 171 150 - 400 K/uL   nRBC 0.0 0.0 - 0.2 %   Neutrophils Relative % 71 %   Neutro Abs 5.7 1.7 - 7.7 K/uL   Lymphocytes Relative 23 %   Lymphs Abs 1.8 0.7 - 4.0 K/uL   Monocytes Relative 6 %   Monocytes Absolute 0.5 0.1 - 1.0 K/uL   Eosinophils Relative 0 %   Eosinophils Absolute 0.0 0.0 - 0.5 K/uL   Basophils Relative 0 %   Basophils Absolute 0.0 0.0 - 0.1 K/uL   Immature Granulocytes 0 %   Abs Immature Granulocytes 0.02 0.00 - 0.07 K/uL  Lipase, blood  Result Value Ref Range   Lipase 44 11 - 51 U/L  Urinalysis, Routine w reflex microscopic  Result Value Ref Range   Color, Urine YELLOW YELLOW   APPearance CLEAR CLEAR   Specific Gravity, Urine 1.018 1.005 - 1.030   pH 5.0 5.0 - 8.0   Glucose, UA NEGATIVE NEGATIVE mg/dL   Hgb urine dipstick NEGATIVE  NEGATIVE   Bilirubin Urine NEGATIVE NEGATIVE   Ketones, ur NEGATIVE NEGATIVE mg/dL   Protein, ur >=782 (A) NEGATIVE mg/dL   Nitrite NEGATIVE NEGATIVE   Leukocytes,Ua NEGATIVE NEGATIVE   RBC / HPF 0-5 0 - 5 RBC/hpf   WBC, UA 0-5 0 - 5 WBC/hpf   Bacteria, UA RARE (A) NONE SEEN  Squamous Epithelial / LPF 0-5 0 - 5 /HPF   Mucus PRESENT    Hyaline Casts, UA PRESENT   Comprehensive metabolic panel  Result Value Ref Range   Sodium 132 (L) 135 - 145 mmol/L   Potassium 3.2 (L) 3.5 - 5.1 mmol/L   Chloride 105 98 - 111 mmol/L   CO2 23 22 - 32 mmol/L   Glucose, Bld 185 (H) 70 - 99 mg/dL   BUN 16 6 - 20 mg/dL   Creatinine, Ser 1.611.18 0.61 - 1.24 mg/dL   Calcium 8.4 (L) 8.9 - 10.3 mg/dL   Total Protein 8.7 (H) 6.5 - 8.1 g/dL   Albumin 3.3 (L) 3.5 - 5.0 g/dL   AST 22 15 - 41 U/L   ALT 17 0 - 44 U/L   Alkaline Phosphatase 44 38 - 126 U/L   Total Bilirubin 0.6 0.3 - 1.2 mg/dL   GFR, Estimated >09>60 >60>60 mL/min   Anion gap 4 (L) 5 - 15  CBG monitoring, ED  Result Value Ref Range   Glucose-Capillary 164 (H) 70 - 99 mg/dL  Troponin I (High Sensitivity)  Result Value Ref Range   Troponin I (High Sensitivity) 3 <18 ng/L   US LIMITED JOINT SPACE STRUCTURES LOW LEFT(NO LINKED CHARGES)  Result Date: 11/10/2022 Procedure: Real-time Ultrasound Guided Injection of left SI joint Device: Philips Affiniti 50G Images permanently stored and available for review in PACS Verbal informed consent obtained.  Discussed risks and benefits of procedure. Warned about infection, bleeding, hyperglycemia damage to structures among others. Patient expresses understanding and agreement Time-out conducted.  Noted no overlying erythema, induration, or other signs of local infection.  Skin prepped in a sterile fashion.  Local anesthesia: Topical Ethyl chloride.  With sterile technique and under real time ultrasound guidance: 40 mg of Kenalog and 2 mL of Marcaine injected into SI joint. Fluid seen entering the joint capsule.   Completed without difficulty  Pain immediately resolved suggesting accurate placement of the medication.  Advised to call if fevers/chills, erythema, induration, drainage, or persistent bleeding.  Images permanently stored and available for review in the ultrasound unit. Impression: Technically successful ultrasound guided injection     EKG EKG Interpretation  Date/Time:  Friday December 03 2022 08:50:45 EST Ventricular Rate:  69 PR Interval:  220 QRS Duration: 119 QT Interval:  410 QTC Calculation: 440 R Axis:   75 Text Interpretation: Sinus rhythm Prolonged PR interval Nonspecific intraventricular conduction delay No previous tracing Confirmed by Cathren LaineSteinl, Lequisha Cammack ([id number]) on 12/03/2022 9:34:18 AM  Radiology MR BRAIN WO CONTRAST  Result Date: 12/03/2022 CLINICAL DATA:  Syncope/presyncope, cerebrovascular cause suspected acute dizziness/unsteadiness, r/o cva EXAM: MRI HEAD WITHOUT CONTRAST TECHNIQUE: Multiplanar, multiecho pulse sequences of the brain and surrounding structures were obtained without intravenous contrast. COMPARISON:  CT head from the same day. FINDINGS: Brain: Acute inferomedial left cerebellar infarct. Associated edema without significant mass effect. No evidence of acute hemorrhage, mass lesion, midline shift or hydrocephalus. Vascular: Major arterial flow voids are a skull base. Skull and upper cervical spine: Normal marrow signal. Sinuses/Orbits: Opacified anterior right ethmoid air cell. No acute orbital findings. Other: No mastoid effusions. IMPRESSION: Acute inferomedial left cerebellar infarct. Associated edema without significant mass effect. Electronically Signed   By: Feliberto HartsFrederick S Jones M.D.   On: 12/03/2022 11:57   CT Head Wo Contrast  Result Date: 12/03/2022 CLINICAL DATA:  Syncope/presyncope, cerebrovascular cause suspected. Dizziness. EXAM: CT HEAD WITHOUT CONTRAST TECHNIQUE: Contiguous axial images were obtained from the base of the skull through  the vertex without  intravenous contrast. RADIATION DOSE REDUCTION: This exam was performed according to the departmental dose-optimization program which includes automated exposure control, adjustment of the mA and/or kV according to patient size and/or use of iterative reconstruction technique. COMPARISON:  Head CT 10/28/2003 FINDINGS: Brain: There is no evidence of an acute infarct, intracranial hemorrhage, mass, midline shift, or extra-axial fluid collection. The ventricles and sulci are normal. Vascular: No hyperdense vessel. Skull: No fracture or suspicious osseous lesion. Sinuses/Orbits: Mild mucosal thickening in the paranasal sinuses. Clear mastoid air cells. Unremarkable orbits. Other: None. IMPRESSION: Unremarkable CT appearance of the brain. Electronically Signed   By: Sebastian Ache M.D.   On: 12/03/2022 09:50    Procedures Procedures    Medications Ordered in ED Medications  ondansetron (ZOFRAN) injection 4 mg (4 mg Intravenous Given 12/03/22 0830)    ED Course/ Medical Decision Making/ A&P                           Medical Decision Making Problems Addressed: Cerebellar stroke, acute [facility]): acute illness or injury with systemic symptoms that poses a threat to life or bodily functions Dizziness: acute illness or injury that poses a threat to life or bodily functions Hypokalemia: acute illness or injury Nausea and vomiting in adult: acute illness or injury that poses a threat to life or bodily functions  Amount and/or Complexity of Data Reviewed Independent Historian: EMS    Details: hx External Data Reviewed: notes. Labs: ordered. Decision-making details documented in ED Course. Radiology: ordered and independent interpretation performed. Decision-making details documented in ED Course. ECG/medicine tests: ordered and independent interpretation performed. Decision-making details documented in ED Course. Discussion of management or test interpretation with external provider(s):  Neurology/medicine  Risk Prescription drug management. Decision regarding hospitalization.   Iv ns. Continuous pulse ox and cardiac monitoring. Labs ordered/sent. Imaging ordered.   Diff dx includes viral uri, GE, dehydration, anemia, vertigo, etc - dispo decision including potential need for admission considered - will get labs, give tx, and reassess.  Reviewed nursing notes and prior charts for additional history. External reports reviewed. Additional history from: EMS/family.   Cardiac monitor: sinus rhythm, rate 88.  Labs reviewed/interpreted by me - hgb normal. K low, kcl iv. Mg added.   CT reviewed/interpreted by me - no hem.   Ns bolus. Zofran iv.   Symptoms improved from prior but not resolved. Given dizziness/unsteadiness, will get mri r/o posterior circ cva.   MRI reviewed/interpreted by me - +cva. Neurology consulted - discussed pt, they will see/eval, and indicate admit to hospitalists to tele bed.   CRITICAL CARE RE: acute cerebellar stroke Performed by: Suzi Roots Total critical care time: 40 minutes Critical care time was exclusive of separately billable procedures and treating other patients. Critical care was necessary to treat or prevent imminent or life-threatening deterioration. Critical care was time spent personally by me on the following activities: development of treatment plan with patient and/or surrogate as well as nursing, discussions with consultants, evaluation of patient's response to treatment, examination of patient, obtaining history from patient or surrogate, ordering and performing treatments and interventions, ordering and review of laboratory studies, ordering and review of radiographic studies, pulse oximetry and re-evaluation of patient's condition.          Final Clinical Impression(s) / ED Diagnoses Final diagnoses:  None    Rx / DC Orders ED Discharge Orders     None  Cathren Laine, MD 12/03/22 [phone]

## 2022-12-03 NOTE — ED Notes (Signed)
Neuro at BS.

## 2022-12-04 DIAGNOSIS — I639 Cerebral infarction, unspecified: Secondary | ICD-10-CM | POA: Diagnosis not present

## 2022-12-04 LAB — CARDIOLIPIN ANTIBODIES, IGG, IGM, IGA
Anticardiolipin IgA: <9 APL U/mL (ref 0–11)
Anticardiolipin IgG: <9 GPL U/mL (ref 0–14)
Anticardiolipin IgM: <9 MPL U/mL (ref 0–12)

## 2022-12-04 LAB — ENA+DNA/DS+ANTICH+CENTRO+JO...
Anti JO-1: <0.2 AI (ref 0.0–0.9)
Centromere Ab Screen: <0.2 AI (ref 0.0–0.9)
Chromatin Ab SerPl-aCnc: 1.1 AI — ABNORMAL HIGH (ref 0.0–0.9)
ENA SM Ab Ser-aCnc: <0.2 AI (ref 0.0–0.9)
Ribonucleic Protein: 0.2 AI (ref 0.0–0.9)
SSA (Ro) (ENA) Antibody, IgG: >8 AI — ABNORMAL HIGH (ref 0.0–0.9)
SSB (La) (ENA) Antibody, IgG: <0.2 AI (ref 0.0–0.9)
Scleroderma (Scl-70) (ENA) Antibody, IgG: <0.2 AI (ref 0.0–0.9)
ds DNA Ab: 19 IU/mL — ABNORMAL HIGH (ref 0–9)

## 2022-12-04 LAB — ANA W/REFLEX IF POSITIVE: Anti Nuclear Antibody (ANA): POSITIVE — AB

## 2022-12-04 LAB — LIPID PANEL
Cholesterol: 134 mg/dL (ref 0–200)
HDL: 34 mg/dL — ABNORMAL LOW (ref >40)
LDL Cholesterol: 79 mg/dL (ref 0–99)
Total CHOL/HDL Ratio: 3.9 RATIO
Triglycerides: 107 mg/dL (ref <150)
VLDL: 21 mg/dL (ref 0–40)

## 2022-12-04 LAB — ANTI-DNA ANTIBODY, DOUBLE-STRANDED: ds DNA Ab: 19 IU/mL — ABNORMAL HIGH (ref 0–9)

## 2022-12-04 LAB — BETA-2-GLYCOPROTEIN I ABS, IGG/M/A
Beta-2 Glyco I IgG: <9 GPI IgG units (ref 0–20)
Beta-2-Glycoprotein I IgA: <9 GPI IgA units (ref 0–25)
Beta-2-Glycoprotein I IgM: <9 GPI IgM units (ref 0–32)

## 2022-12-04 LAB — HOMOCYSTEINE: Homocysteine: 9.1 umol/L (ref 0.0–14.5)

## 2022-12-04 MED ORDER — ATORVASTATIN CALCIUM 80 MG PO TABS: 80.0000 mg | ORAL_TABLET | Freq: Every day | ORAL | 1 refills | Status: DC

## 2022-12-04 MED ORDER — ASPIRIN 81 MG PO TBEC: 81.0000 mg | DELAYED_RELEASE_TABLET | Freq: Every day | ORAL | 11 refills | Status: AC

## 2022-12-04 MED ORDER — CLOPIDOGREL BISULFATE 75 MG PO TABS: 75.0000 mg | ORAL_TABLET | Freq: Every day | ORAL | 0 refills | Status: DC

## 2022-12-04 NOTE — Plan of Care (Signed)
  Problem: Education: Goal: Knowledge of disease or condition will improve Outcome: Progressing Goal: Knowledge of secondary prevention will improve (MUST DOCUMENT ALL) Outcome: Progressing Goal: Knowledge of patient specific risk factors will improve Austin [name] N/A or DELETE if not current risk factor) Outcome: Progressing   Problem: Health Behavior/Discharge Planning: Goal: Ability to manage health-related needs will improve Outcome: Progressing   Problem: Pain Managment: Goal: General experience of comfort will improve Outcome: Progressing   Problem: Safety: Goal: Ability to remain free from injury will improve Outcome: Progressing

## 2022-12-04 NOTE — Progress Notes (Addendum)
STROKE TEAM PROGRESS NOTE   INTERVAL HISTORY His wife is at the bedside. Reviewed imaging results with both.  Patient is Aox3, able to explain event and give history. MAE. VF intact. No drift with 5/5 in all extremities.  C/o mild ha, tylenol given previously that helps.   Started on aspirin and plavix. Will continue DAPT for 3 weeks, then aspirin alone. Started on lipitor 80mg , recommend continuing at discharge.   Hypercoagulable panel sent out, pending remaining labs.  Recommend follow-up outpatient TEE.   Ok for discharge from neurological standpoint.    Vitals:   12/03/22 2033 12/04/22 0007 12/04/22 0415 12/04/22 0729  BP: 105/88 109/81 109/64 103/68  Pulse: 87 81 71 82  Resp: 20 18 18 18   Temp: 99.7 F (37.6 C) 98.6 F (37 C) 98.4 F (36.9 C) 98.4 F (36.9 C)  TempSrc: Oral Oral Oral Oral  SpO2: 95% 97% 98% 98%  Weight:       CBC:  Recent Labs  Lab 12/03/22 0830  WBC 8.1  NEUTROABS 5.7  HGB 13.2  HCT 41.0  MCV 88.6  PLT 171   Basic Metabolic Panel:  Recent Labs  Lab 12/03/22 0830  NA 132*  K 3.2*  CL 105  CO2 23  GLUCOSE 185*  BUN 16  CREATININE 1.18  CALCIUM 8.4*  MG 1.7   Lipid Panel:  Recent Labs  Lab 12/04/22 0324  CHOL 134  TRIG 107  HDL 34*  CHOLHDL 3.9  VLDL 21  LDLCALC 79   HgbA1c:  Recent Labs  Lab 12/03/22 1349  HGBA1C 5.3   Urine Drug Screen:  Recent Labs  Lab 12/03/22 1230  LABOPIA NONE DETECTED  COCAINSCRNUR NONE DETECTED  LABBENZ NONE DETECTED  AMPHETMU NONE DETECTED  THCU NONE DETECTED  LABBARB NONE DETECTED    Alcohol Level No results for input(s): "ETH" in the last 168 hours.  IMAGING past 24 hours ECHOCARDIOGRAM COMPLETE  Result Date: 12/03/2022    ECHOCARDIOGRAM REPORT   Patient Name:   Austin [name] Date of Exam: 12/03/2022 Medical Rec #:  Mardene Speak         Height:       77.0 in Accession #:    12/05/2022        Weight:       244.0 lb Date of Birth:  07-May-1976         BSA:          2.434 m Patient  Age:    46 years          BP:           126/86 mmHg Patient Gender: M                 HR:           86 bpm. Exam Location:  Inpatient Procedure: 2D Echo Indications:    stroke  History:        Patient has no prior history of Echocardiogram examinations.                 Lupus; Risk Factors:Hypertension and Dyslipidemia.  Sonographer:    [id number] RDCS Referring Phys: 01/01[birth year]1974 ALLISON WOLFE IMPRESSIONS  1. Left ventricular ejection fraction, by estimation, is 60 to 65%. The left ventricle has normal function. The left ventricle has no regional wall motion abnormalities. Left ventricular diastolic parameters are consistent with Grade I diastolic dysfunction (impaired relaxation).  2. Right ventricular systolic function is normal. The right ventricular size is  normal.  3. Left atrial size was moderately dilated.  4. The mitral valve is normal in structure. No evidence of mitral valve regurgitation. No evidence of mitral stenosis.  5. The aortic valve is tricuspid. Aortic valve regurgitation is not visualized. No aortic stenosis is present.  6. Aortic dilatation noted. There is mild dilatation of the ascending aorta, measuring 40 mm.  7. The inferior vena cava is normal in size with greater than 50% respiratory variability, suggesting right atrial pressure of 3 mmHg. FINDINGS  Left Ventricle: Left ventricular ejection fraction, by estimation, is 60 to 65%. The left ventricle has normal function. The left ventricle has no regional wall motion abnormalities. The left ventricular internal cavity size was normal in size. There is  no left ventricular hypertrophy. Left ventricular diastolic parameters are consistent with Grade I diastolic dysfunction (impaired relaxation). Normal left ventricular filling pressure. Right Ventricle: The right ventricular size is normal. No increase in right ventricular wall thickness. Right ventricular systolic function is normal. Left Atrium: Left atrial size was moderately dilated.  Right Atrium: Right atrial size was normal in size. Pericardium: There is no evidence of pericardial effusion. Mitral Valve: The mitral valve is normal in structure. No evidence of mitral valve regurgitation. No evidence of mitral valve stenosis. Tricuspid Valve: The tricuspid valve is normal in structure. Tricuspid valve regurgitation is not demonstrated. No evidence of tricuspid stenosis. Aortic Valve: The aortic valve is tricuspid. Aortic valve regurgitation is not visualized. No aortic stenosis is present. Pulmonic Valve: The pulmonic valve was normal in structure. Pulmonic valve regurgitation is not visualized. No evidence of pulmonic stenosis. Aorta: Aortic dilatation noted. There is mild dilatation of the ascending aorta, measuring 40 mm. Venous: The inferior vena cava is normal in size with greater than 50% respiratory variability, suggesting right atrial pressure of 3 mmHg. IAS/Shunts: No atrial level shunt detected by color flow Doppler.  LEFT VENTRICLE PLAX 2D LVIDd:         5.80 cm   Diastology LVIDs:         3.10 cm   LV e' medial:    10.70 cm/s LV PW:         1.30 cm   LV E/e' medial:  6.2 LV IVS:        0.90 cm   LV e' lateral:   14.30 cm/s LVOT diam:     2.50 cm   LV E/e' lateral: 4.7 LV SV:         100 LV SV Index:   41 LVOT Area:     4.91 cm  RIGHT VENTRICLE             IVC RV Basal diam:  3.00 cm     IVC diam: 1.40 cm RV S prime:     18.30 cm/s TAPSE (M-mode): 2.9 cm LEFT ATRIUM           Index        RIGHT ATRIUM           Index LA diam:      4.00 cm 1.64 cm/m   RA Area:     14.80 cm LA Vol (A2C): 81.5 ml 33.48 ml/m  RA Volume:   35.30 ml  14.50 ml/m LA Vol (A4C): 60.6 ml 24.90 ml/m  AORTIC VALVE LVOT Vmax:   108.00 cm/s LVOT Vmean:  73.000 cm/s LVOT VTI:    0.204 m  AORTA Ao Root diam: 3.50 cm Ao Asc diam:  4.00 cm MV E velocity: 66.80  cm/s MV A velocity: 85.90 cm/s  SHUNTS MV E/A ratio:  0.78        Systemic VTI:  0.20 m                            Systemic Diam: 2.50 cm Skeet Latch  MD Electronically signed by Skeet Latch MD Signature Date/Time: 12/03/2022/7:22:13 PM    Final    VAS [place] LOWER EXTREMITY VENOUS (DVT)  Result Date: 12/03/2022  Lower Venous DVT Study Patient Name:  OCTAVIEN FAGGART  Date of Exam:   12/03/2022 Medical Rec #: [id number]          Accession #:    [id number] Date of Birth: [birth year]          Patient Gender: M Patient Age:   16 years Exam Location:  [facility] Procedure:      VAS [place] LOWER EXTREMITY VENOUS (DVT) Referring Phys: Amie [place] --------------------------------------------------------------------------------  Indications: Pain.  Risk Factors: None identified. Comparison Study: No prior studies. Performing Technologist: Oliver Hum RVT  Examination Guidelines: A complete evaluation includes B-mode imaging, spectral Doppler, color Doppler, and power Doppler as needed of all accessible portions of each vessel. Bilateral testing is considered an integral part of a complete examination. Limited examinations for reoccurring indications may be performed as noted. The reflux portion of the exam is performed with the patient in reverse Trendelenburg.  +---------+---------------+---------+-----------+----------+--------------+ RIGHT    CompressibilityPhasicitySpontaneityPropertiesThrombus Aging +---------+---------------+---------+-----------+----------+--------------+ CFV      Full           Yes      Yes                                 +---------+---------------+---------+-----------+----------+--------------+ SFJ      Full                                                        +---------+---------------+---------+-----------+----------+--------------+ FV Prox  Full                                                        +---------+---------------+---------+-----------+----------+--------------+ FV Mid   Full                                                         +---------+---------------+---------+-----------+----------+--------------+ FV DistalFull                                                        +---------+---------------+---------+-----------+----------+--------------+ PFV      Full                                                        +---------+---------------+---------+-----------+----------+--------------+  POP      Full           Yes      Yes                                 +---------+---------------+---------+-----------+----------+--------------+ PTV      Full                                                        +---------+---------------+---------+-----------+----------+--------------+ PERO     Full                                                        +---------+---------------+---------+-----------+----------+--------------+   +---------+---------------+---------+-----------+----------+--------------+ LEFT     CompressibilityPhasicitySpontaneityPropertiesThrombus Aging +---------+---------------+---------+-----------+----------+--------------+ CFV      Full           Yes      Yes                                 +---------+---------------+---------+-----------+----------+--------------+ SFJ      Full                                                        +---------+---------------+---------+-----------+----------+--------------+ FV Prox  Full                                                        +---------+---------------+---------+-----------+----------+--------------+ FV Mid   Full                                                        +---------+---------------+---------+-----------+----------+--------------+ FV DistalFull                                                        +---------+---------------+---------+-----------+----------+--------------+ PFV      Full                                                         +---------+---------------+---------+-----------+----------+--------------+ POP      Full           Yes      Yes                                 +---------+---------------+---------+-----------+----------+--------------+  PTV      Full                                                        +---------+---------------+---------+-----------+----------+--------------+ PERO     Full                                                        +---------+---------------+---------+-----------+----------+--------------+     Summary: RIGHT: - There is no evidence of deep vein thrombosis in the lower extremity.  - No cystic structure found in the popliteal fossa.  LEFT: - There is no evidence of deep vein thrombosis in the lower extremity.  - No cystic structure found in the popliteal fossa.  *See table(s) above for measurements and observations. Electronically signed by Jamelle Haring on 12/03/2022 at 5:24:28 PM.    Final    CT ANGIO HEAD NECK W WO CM  Addendum Date: 12/03/2022   ADDENDUM REPORT: 12/03/2022 15:55 ADDENDUM: Findings discussed with Dr. Rogers Blocker via telephone at 3:53 p.m. Electronically Signed   By: Margaretha Sheffield M.D.   On: 12/03/2022 15:55   Result Date: 12/03/2022 CLINICAL DATA:  Neuro deficit, acute, stroke suspected EXAM: CT ANGIOGRAPHY HEAD AND NECK TECHNIQUE: Multidetector CT imaging of the head and neck was performed using the standard protocol during bolus administration of intravenous contrast. Multiplanar CT image reconstructions and MIPs were obtained to evaluate the vascular anatomy. Carotid stenosis measurements (when applicable) are obtained utilizing NASCET criteria, using the distal internal carotid diameter as the denominator. RADIATION DOSE REDUCTION: This exam was performed according to the departmental dose-optimization program which includes automated exposure control, adjustment of the mA and/or kV according to patient size and/or use of iterative reconstruction  technique. CONTRAST:  15mL OMNIPAQUE IOHEXOL 350 MG/ML SOLN COMPARISON:  None Available. FINDINGS: CTA NECK FINDINGS Despite repeat attempt, limited study due to poor contrast bolus timing. Within this limitation: Aortic arch: Great vessel origins are patent without significant stenosis. Right carotid system: Patent. No evidence of significant (greater than 50%) stenosis. Proximal evaluation limited by streak artifact. Left carotid system: Patent. No evidence of significant (greater than 50%) stenosis. Proximal evaluation limited by streak artifact. Vertebral arteries: Patent bilaterally. No visible significant stenosis. Proximal evaluation limited by streak artifact Skeleton: No acute findings. Other neck: No acute findings. Upper chest: Calcified granuloma in the left upper lobe. Otherwise, clear lung apices. Review of the MIP images confirms the above findings CTA HEAD FINDINGS Despite repeat attempt, limited study due to poor contrast bolus timing. Within this limitation: Anterior circulation: The internal cerebral arteries and proximal MCAs and proximal ACAs are patent without visible high-grade stenosis. Posterior circulation: Bilateral intradural vertebral arteries, basilar artery and proximal posterior cerebral arteries are patent without visible high-grade stenosis. Suspected occlusion versus severe stenosis of the left PICA (series 8, images 118 through 121. Right fetal type PCA, anatomic variant. Venous sinuses: No visible dural venous sinus thrombosis. Review of the MIP images confirms the above findings IMPRESSION: 1. 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 2. Despite repeat attempt, limited study due to poor contrast bolus timing. This is of unclear etiology but could potentially due to poor cardiac output.  Electronically Signed: By: Margaretha Sheffield M.D. On: 12/03/2022 15:46   MR BRAIN WO CONTRAST  Result Date: 12/03/2022 CLINICAL DATA:  Syncope/presyncope, cerebrovascular cause  suspected acute dizziness/unsteadiness, r/o cva EXAM: MRI HEAD WITHOUT CONTRAST TECHNIQUE: Multiplanar, multiecho pulse sequences of the brain and surrounding structures were obtained without intravenous contrast. COMPARISON:  CT head from the same day. FINDINGS: Brain: Acute inferomedial left cerebellar infarct. Associated edema without significant mass effect. No evidence of acute hemorrhage, mass lesion, midline shift or hydrocephalus. Vascular: Major arterial flow voids are a skull base. Skull and upper cervical spine: Normal marrow signal. Sinuses/Orbits: Opacified anterior right ethmoid air cell. No acute orbital findings. Other: No mastoid effusions. IMPRESSION: Acute inferomedial left cerebellar infarct. Associated edema without significant mass effect. Electronically Signed   By: Margaretha Sheffield M.D.   On: 12/03/2022 11:57   CT Head Wo Contrast  Result Date: 12/03/2022 CLINICAL DATA:  Syncope/presyncope, cerebrovascular cause suspected. Dizziness. EXAM: CT HEAD WITHOUT CONTRAST TECHNIQUE: Contiguous axial images were obtained from the base of the skull through the vertex without intravenous contrast. RADIATION DOSE REDUCTION: This exam was performed according to the departmental dose-optimization program which includes automated exposure control, adjustment of the mA and/or kV according to patient size and/or use of iterative reconstruction technique. COMPARISON:  Head CT 10/28/2003 FINDINGS: Brain: There is no evidence of an acute infarct, intracranial hemorrhage, mass, midline shift, or extra-axial fluid collection. The ventricles and sulci are normal. Vascular: No hyperdense vessel. Skull: No fracture or suspicious osseous lesion. Sinuses/Orbits: Mild mucosal thickening in the paranasal sinuses. Clear mastoid air cells. Unremarkable orbits. Other: None. IMPRESSION: Unremarkable CT appearance of the brain. Electronically Signed   By: Logan Bores M.D.   On: 12/03/2022 09:50    PHYSICAL  EXAM  Gen: In bed, NAD Resp: non-labored breathing, no acute distress Abd: soft, nt  Neuro: Mental Status: Aox4, able to give history.  No aphasia.  No dysarthria. Cranial Nerves:EOMI, no VF deficits, Symmetric face, Hearing intact, shoulder shrug symmetric, tongue protrusion midline.  Motor: Nonfocal normal strength in all 4 extremities. Sensory:Intact bilaterally to light touch. Cerebellar: Finger-Nose and RAM intact Gait: Deferred.   ASSESSMENT/PLAN HA KRENGEL is a 46 y.o. male past medical history of hypertension, hyperlipidemia-not on medications, history of glomerulonephritis with concern for nephrotic syndrome versus lupus who was on chronic prednisone for a while and has been off of it, presenting to the emergency room for evaluation of sudden onset of dizziness that was noted upon waking up this morning.  His last known well was when he went to bed last night and woke up 4 AM and noticed his symptoms upon waking up.  Says he was trying to get up from the chair and almost fell that he was in a lot of disequilibrium and swerving to the left.  He felt the room spinning around him.  He had to sit down and symptoms somewhat resolved.  He has had some headaches with pain on the left temple and seeing some flashes of lights or sores of light on the left for which she takes Tylenol.  Today he had a lot of nausea which she has never had with his headaches.  All of the symptoms made him come to the emergency room for further evaluation.  Not much of abnormalities on physical exam but because of his neurological complaints an MRI was done which showed a cerebellar infarct-see details below. Neurology was consulted for further recommendations He says that he had kidney problems in  2007 and 2013 and there was concern for lupus nephritis he followed with a rheumatologist who was not very convinced that the diagnosis was confirmed lupus.  He has not been on chronic prednisone for a while. 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 Unremarkable CT appearance of the brain. 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 Despite repeat attempt, limited study due to poor contrast bolus timing. This is of unclear etiology but could potentially due to poor cardiac output. MRI:  Acute inferomedial left cerebellar infarct. Associated edema without significant mass effect.  Additional stroke work-up testing 2D Echo: EF 60-65%. Grade I diastolic dysfunction.  [place] LE: Negative for DVT LDL 79 HgbA1c 5.3 Recommend tight control of blood sugar while on prednisone VTE prophylaxis - lovenox    Diet   Diet regular Room service appropriate? Yes; Fluid consistency: Thin   No antithrombotic prior to admission, now on aspirin 81 mg daily and clopidogrel 75 mg daily. Continue DAPT for 3 weeks, then aspirin alone.  Therapy recommendations:  No f/u needed Disposition:  Per Primary  Hyperlipidemia Home meds:  none LDL 79, goal < 70 Add Lipitor 80mg   Continue statin at discharge  Other Stroke Risk Factors Family hx stroke (grandfather)  Other Active Problems (per primary)  Hypokalemia Potassium 3.2 with  normal magnesium Replaced   Lupus (systemic lupus erythematosus) ([place]) Had 2 renal biopsies done in 2014 and 2017.  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 Follow-up with Cardiology for Outpatient TEE   Hospital day # 0   Pt seen by Neuro NP/APP and later by MD. Note/plan to be edited by MD as needed.    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  His lupus is predominantly in his kidneys.  His lupus has been stable for over 5 years.  Due to his young age young stroke workup recommended with hypercoagulable labs.  Echo was unremarkable he will need TEE but due to holiday weekend opted to do as outpatient.  He will also need cardiac monitoring outpatient to look for atrial fibrillation.  DAPT therapy for 3 weeks and then aspirin alone.  Strongly recommended outpatient stroke follow-up to ensure workup is completed.  He and his wife understand this and they prefer to go home today.  Dr. Reeves Forth evaluated pt independently, reviewed imaging, chart, labs. Discussed and formulated plan with the Resident/APP. Changes were made to the note where appropriate. Please see APP/resident note above for details.    Yohana Bartha,MD   To contact Stroke Continuity provider, please refer to [email or web address] After hours, contact General Neurology

## 2022-12-04 NOTE — Progress Notes (Signed)
Nursing Discharge Note   Admit Date: 12/03/2022  Discharge date: 12/04/2022     Austin [name] is to be discharged home per MD order.  AVS completed. Reviewed with patient and family at bedside. Highlighted copy provided for patient to take home.  Patient/caregiver able to verbalize understanding of discharge instructions. PIV removed. Patient stable upon discharge.   Allergies as of 12/04/2022   No Known Allergies      Medication List     STOP taking these medications    predniSONE 10 MG tablet Commonly known as: DELTASONE   tiZANidine 4 MG tablet Commonly known as: ZANAFLEX       TAKE these medications    aspirin EC 81 MG tablet Take 1 tablet (81 mg total) by mouth daily. Swallow whole. Start taking on: December 05, 2022   atorvastatin 80 MG tablet Commonly known as: LIPITOR Take 1 tablet (80 mg total) by mouth daily. Start taking on: December 05, 2022   clopidogrel 75 MG tablet Commonly known as: PLAVIX Take 1 tablet (75 mg total) by mouth daily. Start taking on: December 05, 2022        Discharge Instructions/ Education: Discharge instructions given to patient/family with verbalized understanding. Discharge education completed with patient/family including: follow up instructions, medication list, discharge activities, and limitations if indicated. Additional discharge instructions as indicated by discharging provider also reviewed. Patient and family able to verbalize understanding, all questions fully answered. Patient instructed to return to Emergency Department, call 911, or call MD for any changes in condition.  Patient escorted via wheelchair to lobby and discharged home via private automobile.

## 2022-12-04 NOTE — Discharge Summary (Signed)
Physician Discharge Summary  Austin [name] ZOX:[id number] DOB: 03-20-1976 DOA: 12/03/2022  PCP: Kristian Covey, MD  Admit date: 12/03/2022 Discharge date: 12/04/2022  Admitted From: Home Disposition: Home  Recommendations for Outpatient Follow-up:  Follow up with PCP in 1-2 weeks Follow up for TEE outpatient as discussed Neuro follow up as scheduled Will likely need to re-establish rheumatology follow up as well  Home Health: None Equipment/Devices: None  Discharge Condition: Stable CODE STATUS: Full Diet recommendation: Low-salt low-fat diet as discussed  Brief/Interim Summary: WENCESLAUS GIST is a 46 y.o. 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  Patient presents with acute onset dizziness around 4:30AM. He states the spinning did not stop until around 10AM. He had associated nausea and one episode of emesis. He states his gait was abnormal and ataxic in a sense, drifting to the left.  He had a headache on the left side of his head.  He denies any vision changes, slurred speech, focal deficits or vision changes. He denies any extremity weakness.    Patient admitted as above with new onset neurological symptoms of dizziness noted to have confirmed acute cerebellar CVA on MRI.  Neuro following, appreciate insight and recommendations, will initiate 21 days of Plavix with aspirin and statin to be continued indefinitely.  There is some concern for hypercoagulable state given diagnosis of lupus however his labs here remain pending or are within normal limits(see below).  Would recommend close follow-up with rheumatology in the next few weeks as scheduled for further evaluation and treatment.  Neurology also recommending TEE in the near future in the outpatient setting to  further evaluate central etiology of his embolic stroke as his TTE here was unremarkable.  Lengthy discussion with patient and family at bedside about need for close follow-up in the outpatient setting with PCP, rheumatology as well as neurology in the near future to further risk stratify and rule out any further causes of his embolic stroke.  Patient otherwise stable and agreeable for discharge home.  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) -Confirmed on MRI -Neuro following - asa/plavix/statin ongoing -Presumed to be secondary to hypercoagulable state given SLE diagnosis below -outpatient workup with rheumatology as well as TEE per neurology recommendations -PT - no further treatment indicated, symptoms improving -Lipid panel - low HDL -A1C 5.3 -Hypercoag workup - pending -this will need to be followed in the outpatient setting -Echo unremarkable for overt findings- TEE outpatient -CTA suspected occlusion of the left PICA proximally -would likely benefit from repeat imaging in the near future given poor contrast bolus timing -DVT studies BLE negative   Hypokalemia Repleted, follow repeat labs with PCP   Lupus (systemic lupus erythematosus) (HCC) Had 2 renal biopsies done in 2014 and 2017.  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 Swallow whole. Start taking on: December 05, 2022   atorvastatin 80 MG tablet Commonly known as: LIPITOR Take 1 tablet (80 mg total) by mouth daily. Start taking on: December 05, 2022   clopidogrel 75 MG tablet Commonly known as: PLAVIX Take 1 tablet (75 mg total) by mouth daily. Start taking on: December 05, 2022        Follow-up Information     [facility]. Schedule an appointment as soon as possible for a visit in 8 week(s).   Contact information: [address]     [address] [place] [address] [phone]               No Known Allergies  Consultations: Neurology  Procedures/Studies: ECHOCARDIOGRAM COMPLETE  Result Date: 12/03/2022    ECHOCARDIOGRAM REPORT   Patient Name:   Austin [name] Date of Exam: 12/03/2022 Medical Rec #:  [id number]         Height:       77.0 in Accession #:    [id number]        Weight:       244.0 lb Date of Birth:  [birth year]         BSA:          2.434 m Patient Age:    46 years          BP:           126/86 mmHg Patient Gender: M                 HR:           86 bpm. Exam Location:  Inpatient Procedure: 2D Echo Indications:    stroke  History:        Patient has no prior history of Echocardiogram examinations.                 Lupus; Risk Factors:Hypertension and Dyslipidemia.  Sonographer:    Delcie Roch RDCS Referring Phys: [id number] ALLISON WOLFE IMPRESSIONS  1. Left ventricular ejection fraction, by estimation, is 60 to 65%. The left ventricle has normal function. The left ventricle has no regional wall motion abnormalities. Left ventricular diastolic parameters are consistent with Grade I diastolic dysfunction (impaired relaxation).  2. Right ventricular systolic  function is normal. The right ventricular size is normal.  3. Left atrial size was moderately dilated.  4. The mitral valve is normal in structure. No evidence of mitral valve regurgitation. No evidence of mitral stenosis.  5. The aortic valve is tricuspid. Aortic valve regurgitation is not visualized. No aortic stenosis is present.  6. Aortic dilatation noted. There is mild dilatation of the ascending aorta, measuring 40 mm.  7. The inferior vena cava is normal in size with greater than 50% respiratory variability, suggesting right atrial pressure of 3 mmHg. FINDINGS  Left Ventricle: Left ventricular ejection fraction, by estimation, is 60 to 65%. The left ventricle has normal function. The left ventricle has no regional wall motion abnormalities. The left ventricular internal cavity size was normal in size. There is  no left ventricular hypertrophy. Left ventricular diastolic parameters are consistent with Grade I diastolic dysfunction (  impaired relaxation). Normal left ventricular filling pressure. Right Ventricle: The right ventricular size is normal. No increase in right ventricular wall thickness. Right ventricular systolic function is normal. Left Atrium: Left atrial size was moderately dilated. Right Atrium: Right atrial size was normal in size. Pericardium: There is no evidence of pericardial effusion. Mitral Valve: The mitral valve is normal in structure. No evidence of mitral valve regurgitation. No evidence of mitral valve stenosis. Tricuspid Valve: The tricuspid valve is normal in structure. Tricuspid valve regurgitation is not demonstrated. No evidence of tricuspid stenosis. Aortic Valve: The aortic valve is tricuspid. Aortic valve regurgitation is not visualized. No aortic stenosis is present. Pulmonic Valve: The pulmonic valve was normal in structure. Pulmonic valve regurgitation is not visualized. No evidence of pulmonic stenosis. Aorta: Aortic dilatation noted. There is mild dilatation of the  ascending aorta, measuring 40 mm. Venous: The inferior vena cava is normal in size with greater than 50% respiratory variability, suggesting right atrial pressure of 3 mmHg. IAS/Shunts: No atrial level shunt detected by color flow Doppler.  LEFT VENTRICLE PLAX 2D LVIDd:         5.80 cm   Diastology LVIDs:         3.10 cm   LV e' medial:    10.70 cm/s LV PW:         1.30 cm   LV E/e' medial:  6.2 LV IVS:        0.90 cm   LV e' lateral:   14.30 cm/s LVOT diam:     2.50 cm   LV E/e' lateral: 4.7 LV SV:         100 LV SV Index:   41 LVOT Area:     4.91 cm  RIGHT VENTRICLE             IVC RV Basal diam:  3.00 cm     IVC diam: 1.40 cm RV S prime:     18.30 cm/s TAPSE (M-mode): 2.9 cm LEFT ATRIUM           Index        RIGHT ATRIUM           Index LA diam:      4.00 cm 1.64 cm/m   RA Area:     14.80 cm LA Vol (A2C): 81.5 ml 33.48 ml/m  RA Volume:   35.30 ml  14.50 ml/m LA Vol (A4C): 60.6 ml 24.90 ml/m  AORTIC VALVE LVOT Vmax:   108.00 cm/s LVOT Vmean:  73.000 cm/s LVOT VTI:    0.204 m  AORTA Ao Root diam: 3.50 cm Ao Asc diam:  4.00 cm MV E velocity: 66.80 cm/s MV A velocity: 85.90 cm/s  SHUNTS MV E/A ratio:  0.78        Systemic VTI:  0.20 m                            Systemic Diam: 2.50 cm Chilton Si MD Electronically signed by Chilton Si MD Signature Date/Time: 12/03/2022/7:22:13 PM    Final    VAS [place] LOWER EXTREMITY VENOUS (DVT)  Result Date: 12/03/2022  Lower Venous DVT Study Patient Name:  Austin [name]  Date of Exam:   12/03/2022 Medical Rec #: [id number]          Accession #:    [id number] Date of Birth: May 17, 1976          Patient Gender: M Patient Age:  46 years Exam Location:  [facility] Procedure:      VAS [place] LOWER EXTREMITY VENOUS (DVT) Referring Phys: Milon Dikes --------------------------------------------------------------------------------  Indications: Pain.  Risk Factors: None identified. Comparison Study: No prior studies. Performing Technologist: Chanda Busing RVT   Examination Guidelines: A complete evaluation includes B-mode imaging, spectral Doppler, color Doppler, and power Doppler as needed of all accessible portions of each vessel. Bilateral testing is considered an integral part of a complete examination. Limited examinations for reoccurring indications may be performed as noted. The reflux portion of the exam is performed with the patient in reverse Trendelenburg.  +---------+---------------+---------+-----------+----------+--------------+ RIGHT    CompressibilityPhasicitySpontaneityPropertiesThrombus Aging +---------+---------------+---------+-----------+----------+--------------+ CFV      Full           Yes      Yes                                 +---------+---------------+---------+-----------+----------+--------------+ SFJ      Full                                                        +---------+---------------+---------+-----------+----------+--------------+ FV Prox  Full                                                        +---------+---------------+---------+-----------+----------+--------------+ FV Mid   Full                                                        +---------+---------------+---------+-----------+----------+--------------+ FV DistalFull                                                        +---------+---------------+---------+-----------+----------+--------------+ PFV      Full                                                        +---------+---------------+---------+-----------+----------+--------------+ POP      Full           Yes      Yes                                 +---------+---------------+---------+-----------+----------+--------------+ PTV      Full                                                        +---------+---------------+---------+-----------+----------+--------------+ PERO  Full                                                         +---------+---------------+---------+-----------+----------+--------------+   +---------+---------------+---------+-----------+----------+--------------+ LEFT     CompressibilityPhasicitySpontaneityPropertiesThrombus Aging +---------+---------------+---------+-----------+----------+--------------+ CFV      Full           Yes      Yes                                 +---------+---------------+---------+-----------+----------+--------------+ SFJ      Full                                                        +---------+---------------+---------+-----------+----------+--------------+ FV Prox  Full                                                        +---------+---------------+---------+-----------+----------+--------------+ FV Mid   Full                                                        +---------+---------------+---------+-----------+----------+--------------+ FV DistalFull                                                        +---------+---------------+---------+-----------+----------+--------------+ PFV      Full                                                        +---------+---------------+---------+-----------+----------+--------------+ POP      Full           Yes      Yes                                 +---------+---------------+---------+-----------+----------+--------------+ PTV      Full                                                        +---------+---------------+---------+-----------+----------+--------------+ PERO     Full                                                        +---------+---------------+---------+-----------+----------+--------------+  Summary: RIGHT: - There is no evidence of deep vein thrombosis in the lower extremity.  - No cystic structure found in the popliteal fossa.  LEFT: - There is no evidence of deep vein thrombosis in the lower extremity.  - No cystic structure found in the popliteal fossa.   *See table(s) above for measurements and observations. Electronically signed by Heath Lark on 12/03/2022 at 5:24:28 PM.    Final    CT ANGIO HEAD NECK W WO CM  Addendum Date: 12/03/2022   ADDENDUM REPORT: 12/03/2022 15:55 ADDENDUM: Findings discussed with Dr. Artis Flock via telephone at 3:53 p.m. Electronically Signed   By: Feliberto Harts M.D.   On: 12/03/2022 15:55   Result Date: 12/03/2022 CLINICAL DATA:  Neuro deficit, acute, stroke suspected EXAM: CT ANGIOGRAPHY HEAD AND NECK TECHNIQUE: Multidetector CT imaging of the head and neck was performed using the standard protocol during bolus administration of intravenous contrast. Multiplanar CT image reconstructions and MIPs were obtained to evaluate the vascular anatomy. Carotid stenosis measurements (when applicable) are obtained utilizing NASCET criteria, using the distal internal carotid diameter as the denominator. RADIATION DOSE REDUCTION: This exam was performed according to the departmental dose-optimization program which includes automated exposure control, adjustment of the mA and/or kV according to patient size and/or use of iterative reconstruction technique. CONTRAST:  OMNIPAQUE IOHEXOL 350 MG/ML SOLN COMPARISON:  None Available. FINDINGS: CTA NECK FINDINGS Despite repeat attempt, limited study due to poor contrast bolus timing. Within this limitation: Aortic arch: Great vessel origins are patent without significant stenosis. Right carotid system: Patent. No evidence of significant (greater than 50%) stenosis. Proximal evaluation limited by streak artifact. Left carotid system: Patent. No evidence of significant (greater than 50%) stenosis. Proximal evaluation limited by streak artifact. Vertebral arteries: Patent bilaterally. No visible significant stenosis. Proximal evaluation limited by streak artifact Skeleton: No acute findings. Other neck: No acute findings. Upper chest: Calcified granuloma in the left upper lobe. Otherwise, clear  lung apices. Review of the MIP images confirms the above findings CTA HEAD FINDINGS Despite repeat attempt, limited study due to poor contrast bolus timing. Within this limitation: Anterior circulation: The internal cerebral arteries and proximal MCAs and proximal ACAs are patent without visible high-grade stenosis. Posterior circulation: Bilateral intradural vertebral arteries, basilar artery and proximal posterior cerebral arteries are patent without visible high-grade stenosis. Suspected occlusion versus severe stenosis of the left PICA (series 8, images 118 through 121. Right fetal type PCA, anatomic variant. Venous sinuses: No visible dural venous sinus thrombosis. Review of the MIP images confirms the above findings IMPRESSION: 1. 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 2. Despite repeat attempt, limited study due to poor contrast bolus timing. This is of unclear etiology but could potentially due to poor cardiac output. Electronically Signed: By: Feliberto Harts M.D. On: 12/03/2022 15:46   MR BRAIN WO CONTRAST  Result Date: 12/03/2022 CLINICAL DATA:  Syncope/presyncope, cerebrovascular cause suspected acute dizziness/unsteadiness, r/o cva EXAM: MRI HEAD WITHOUT CONTRAST TECHNIQUE: Multiplanar, multiecho pulse sequences of the brain and surrounding structures were obtained without intravenous contrast. COMPARISON:  CT head from the same day. FINDINGS: Brain: Acute inferomedial left cerebellar infarct. Associated edema without significant mass effect. No evidence of acute hemorrhage, mass lesion, midline shift or hydrocephalus. Vascular: Major arterial flow voids are a skull base. Skull and upper cervical spine: Normal marrow signal. Sinuses/Orbits: Opacified anterior right ethmoid air cell. No acute orbital findings. Other: No mastoid effusions. IMPRESSION: Acute inferomedial left cerebellar infarct. Associated edema without significant mass effect.  Electronically Signed   By: Feliberto HartsFrederick S Jones  M.D.   On: 12/03/2022 11:57   CT Head Wo Contrast  Result Date: 12/03/2022 CLINICAL DATA:  Syncope/presyncope, cerebrovascular cause suspected. Dizziness. EXAM: CT HEAD WITHOUT CONTRAST TECHNIQUE: Contiguous axial images were obtained from the base of the skull through the vertex without intravenous contrast. RADIATION DOSE REDUCTION: This exam was performed according to the departmental dose-optimization program which includes automated exposure control, adjustment of the mA and/or kV according to patient size and/or use of iterative reconstruction technique. COMPARISON:  Head CT 10/28/2003 FINDINGS: Brain: There is no evidence of an acute infarct, intracranial hemorrhage, mass, midline shift, or extra-axial fluid collection. The ventricles and sulci are normal. Vascular: No hyperdense vessel. Skull: No fracture or suspicious osseous lesion. Sinuses/Orbits: Mild mucosal thickening in the paranasal sinuses. Clear mastoid air cells. Unremarkable orbits. Other: None. IMPRESSION: Unremarkable CT appearance of the brain. Electronically Signed   By: Sebastian AcheAllen  Grady M.D.   On: 12/03/2022 09:50   US LIMITED JOINT SPACE STRUCTURES LOW LEFT(NO LINKED CHARGES)  Result Date: 11/10/2022 Procedure: Real-time Ultrasound Guided Injection of left SI joint Device: Philips Affiniti 50G Images permanently stored and available for review in PACS Verbal informed consent obtained.  Discussed risks and benefits of procedure. Warned about infection, bleeding, hyperglycemia damage to structures among others. Patient expresses understanding and agreement Time-out conducted.  Noted no overlying erythema, induration, or other signs of local infection.  Skin prepped in a sterile fashion.  Local anesthesia: Topical Ethyl chloride.  With sterile technique and under real time ultrasound guidance: 40 mg of Kenalog and 2 mL of Marcaine injected into SI joint. Fluid seen entering the joint capsule.  Completed without difficulty  Pain  immediately resolved suggesting accurate placement of the medication.  Advised to call if fevers/chills, erythema, induration, drainage, or persistent bleeding.  Images permanently stored and available for review in the ultrasound unit. Impression: Technically successful ultrasound guided injection     Subjective: No acute issues or events overnight, dizziness improving otherwise denies nausea vomiting diarrhea constipation any fevers chills or chest pain   Discharge Exam: Vitals:   12/04/22 0729 12/04/22 1120  BP: 103/68 123/84  Pulse: 82 76  Resp: 18 18  Temp: 98.4 F (36.9 C) 98.2 F (36.8 C)  SpO2: 98% 99%   Vitals:   12/04/22 0007 12/04/22 0415 12/04/22 0729 12/04/22 1120  BP: 109/81 109/64 103/68 123/84  Pulse: 81 71 82 76  Resp: 18 18 18 18   Temp: 98.6 F (37 C) 98.4 F (36.9 C) 98.4 F (36.9 C) 98.2 F (36.8 C)  TempSrc: Oral Oral Oral Oral  SpO2: 97% 98% 98% 99%  Weight:        General: Pt is alert, awake, not in acute distress Cardiovascular: RRR, S1/S2 +, no rubs, no gallops Respiratory: CTA bilaterally, no wheezing, no rhonchi Abdominal: Soft, NT, ND, bowel sounds + Extremities: no edema, no cyanosis    The results of significant diagnostics from this hospitalization (including imaging, microbiology, ancillary and laboratory) are listed below for reference.     Microbiology: Recent Results (from the past 240 hour(s))  Resp panel by RT-PCR (RSV, Flu A&B, Covid) Anterior Nasal Swab     Status: None   Collection Time: 12/03/22  8:44 AM   Specimen: Anterior Nasal Swab  Result Value Ref Range Status   SARS Coronavirus 2 by RT PCR NEGATIVE NEGATIVE Final    Comment: (NOTE) SARS-CoV-2 target nucleic acids are NOT DETECTED.  The SARS-CoV-2 RNA is  generally detectable in upper respiratory specimens during the acute phase of infection. The lowest concentration of SARS-CoV-2 viral copies this assay can detect is 138 copies/mL. A negative result does not  preclude SARS-Cov-2 infection and should not be used as the sole basis for treatment or other patient management decisions. A negative result may occur with  improper specimen collection/handling, submission of specimen other than nasopharyngeal swab, presence of viral mutation(s) within the areas targeted by this assay, and inadequate number of viral copies(<138 copies/mL). A negative result must be combined with clinical observations, patient history, and epidemiological information. The expected result is Negative.  Fact Sheet for Patients:  BloggerCourse.com  Fact Sheet for Healthcare Providers:  SeriousBroker.it  This test is no t yet approved or cleared by the [place] FDA and  has been authorized for detection and/or diagnosis of SARS-CoV-2 by FDA under an Emergency Use Authorization (EUA). This EUA will remain  in effect (meaning this test can be used) for the duration of the COVID-19 declaration under Section 564(b)(1) of the Act, 21 U.S.C.section 360bbb-3(b)(1), unless the authorization is terminated  or revoked sooner.       Influenza A by PCR NEGATIVE NEGATIVE Final   Influenza B by PCR NEGATIVE NEGATIVE Final    Comment: (NOTE) The Xpert Xpress SARS-CoV-2/FLU/RSV plus assay is intended as an aid in the diagnosis of influenza from Nasopharyngeal swab specimens and should not be used as a sole basis for treatment. Nasal washings and aspirates are unacceptable for Xpert Xpress SARS-CoV-2/FLU/RSV testing.  Fact Sheet for Patients: BloggerCourse.com  Fact Sheet for Healthcare Providers: SeriousBroker.it  This test is not yet approved or cleared by the [place] FDA and has been authorized for detection and/or diagnosis of SARS-CoV-2 by FDA under an Emergency Use Authorization (EUA). This EUA will remain in effect (meaning this test can be used) for the  duration of the COVID-19 declaration under Section 564(b)(1) of the Act, 21 U.S.C. section 360bbb-3(b)(1), unless the authorization is terminated or revoked.     Resp Syncytial Virus by PCR NEGATIVE NEGATIVE Final    Comment: (NOTE) Fact Sheet for Patients: BloggerCourse.com  Fact Sheet for Healthcare Providers: SeriousBroker.it  This test is not yet approved or cleared by the [place] FDA and has been authorized for detection and/or diagnosis of SARS-CoV-2 by FDA under an Emergency Use Authorization (EUA). This EUA will remain in effect (meaning this test can be used) for the duration of the COVID-19 declaration under Section 564(b)(1) of the Act, 21 U.S.C. section 360bbb-3(b)(1), unless the authorization is terminated or revoked.  Performed at [facility], [address] [place], [place] [address]      Labs: BNP (last 3 results) No results for input(s): "BNP" in the last 8760 hours. Basic Metabolic Panel: Recent Labs  Lab 12/03/22 0830  NA 132*  K 3.2*  CL 105  CO2 23  GLUCOSE 185*  BUN 16  CREATININE 1.18  CALCIUM 8.4*  MG 1.7   Liver Function Tests: Recent Labs  Lab 12/03/22 0830  AST 22  ALT 17  ALKPHOS 44  BILITOT 0.6  PROT 8.7*  ALBUMIN 3.3*   Recent Labs  Lab 12/03/22 0830  LIPASE 44   No results for input(s): "AMMONIA" in the last 168 hours. CBC: Recent Labs  Lab 12/03/22 0830  WBC 8.1  NEUTROABS 5.7  HGB 13.2  HCT 41.0  MCV 88.6  PLT 171   Cardiac Enzymes: No results for input(s): "CKTOTAL", "CKMB", "CKMBINDEX", "TROPONINI" in  the last 168 hours. BNP: Invalid input(s): "POCBNP" CBG: Recent Labs  Lab 12/03/22 0854  GLUCAP 164*   D-Dimer No results for input(s): "DDIMER" in the last 72 hours. Hgb A1c Recent Labs    12/03/22 1349  HGBA1C 5.3   Lipid Profile Recent Labs    12/04/22 0324  CHOL 134  HDL 34*  LDLCALC 79  TRIG 578  CHOLHDL 3.9   Thyroid  function studies No results for input(s): "TSH", "T4TOTAL", "T3FREE", "THYROIDAB" in the last 72 hours.  Invalid input(s): "FREET3" Anemia work up No results for input(s): "VITAMINB12", "FOLATE", "FERRITIN", "TIBC", "IRON", "RETICCTPCT" in the last 72 hours. Urinalysis    Component Value Date/Time   COLORURINE YELLOW 12/03/2022 0935   APPEARANCEUR CLEAR 12/03/2022 0935   LABSPEC 1.018 12/03/2022 0935   PHURINE 5.0 12/03/2022 0935   GLUCOSEU NEGATIVE 12/03/2022 0935   HGBUR NEGATIVE 12/03/2022 0935   BILIRUBINUR NEGATIVE 12/03/2022 0935   BILIRUBINUR neg 10/30/2018 1607   KETONESUR NEGATIVE 12/03/2022 0935   PROTEINUR >=300 (A) 12/03/2022 0935   UROBILINOGEN 4.0 (A) 10/30/2018 1607   UROBILINOGEN 1.0 09/28/2013 1932   NITRITE NEGATIVE 12/03/2022 0935   LEUKOCYTESUR NEGATIVE 12/03/2022 0935   Sepsis Labs Recent Labs  Lab 12/03/22 0830  WBC 8.1   Microbiology Recent Results (from the past 240 hour(s))  Resp panel by RT-PCR (RSV, Flu A&B, Covid) Anterior Nasal Swab     Status: None   Collection Time: 12/03/22  8:44 AM   Specimen: Anterior Nasal Swab  Result Value Ref Range Status   SARS Coronavirus 2 by RT PCR NEGATIVE NEGATIVE Final    Comment: (NOTE) SARS-CoV-2 target nucleic acids are NOT DETECTED.  The SARS-CoV-2 RNA is generally detectable in upper respiratory specimens during the acute phase of infection. The lowest concentration of SARS-CoV-2 viral copies this assay can detect is 138 copies/mL. A negative result does not preclude SARS-Cov-2 infection and should not be used as the sole basis for treatment or other patient management decisions. A negative result may occur with  improper specimen collection/handling, submission of specimen other than nasopharyngeal swab, presence of viral mutation(s) within the areas targeted by this assay, and inadequate number of viral copies(<138 copies/mL). A negative result must be combined with clinical observations, patient  history, and epidemiological information. The expected result is Negative.  Fact Sheet for Patients:  BloggerCourse.com  Fact Sheet for Healthcare Providers:  SeriousBroker.it  This test is no t yet approved or cleared by the [place] FDA and  has been authorized for detection and/or diagnosis of SARS-CoV-2 by FDA under an Emergency Use Authorization (EUA). This EUA will remain  in effect (meaning this test can be used) for the duration of the COVID-19 declaration under Section 564(b)(1) of the Act, 21 U.S.C.section 360bbb-3(b)(1), unless the authorization is terminated  or revoked sooner.       Influenza A by PCR NEGATIVE NEGATIVE Final   Influenza B by PCR NEGATIVE NEGATIVE Final    Comment: (NOTE) The Xpert Xpress SARS-CoV-2/FLU/RSV plus assay is intended as an aid in the diagnosis of influenza from Nasopharyngeal swab specimens and should not be used as a sole basis for treatment. Nasal washings and aspirates are unacceptable for Xpert Xpress SARS-CoV-2/FLU/RSV testing.  Fact Sheet for Patients: BloggerCourse.com  Fact Sheet for Healthcare Providers: SeriousBroker.it  This test is not yet approved or cleared by the [place] FDA and has been authorized for detection and/or diagnosis of SARS-CoV-2 by FDA under an Emergency Use Authorization (EUA). This EUA  will remain in effect (meaning this test can be used) for the duration of the COVID-19 declaration under Section 564(b)(1) of the Act, 21 U.S.C. section 360bbb-3(b)(1), unless the authorization is terminated or revoked.     Resp Syncytial Virus by PCR NEGATIVE NEGATIVE Final    Comment: (NOTE) Fact Sheet for Patients: BloggerCourse.com  Fact Sheet for Healthcare Providers: SeriousBroker.it  This test is not yet approved or cleared by the [place] FDA  and has been authorized for detection and/or diagnosis of SARS-CoV-2 by FDA under an Emergency Use Authorization (EUA). This EUA will remain in effect (meaning this test can be used) for the duration of the COVID-19 declaration under Section 564(b)(1) of the Act, 21 U.S.C. section 360bbb-3(b)(1), unless the authorization is terminated or revoked.  Performed at [facility], [address], [place], [place] [address]      Time coordinating discharge: Over 30 minutes  SIGNED:   Azucena Fallen, DO [facility] 12/04/2022, 12:02 PM Pager   If 7PM-7AM, please contact night-coverage [email or web address]

## 2022-12-04 NOTE — Evaluation (Signed)
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 PMH includes lupus, HTN, HLD.   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 Upon evaluation he reported concern with his STM, and also noted that he has "floaters" at baseline. Overall he was mod I for mobility and ADLs, no DME. Pt denied dizziness and nausea. SBT completed with a mild error in immediate recall, otherwise the score did not represent a cognitive impairment. He does not require further OT acutely or at d/c. Recommend d/c to home with support of family.      Recommendations for follow up therapy are one component of a multi-disciplinary discharge planning process, led by the attending physician.  Recommendations may be updated based on patient status, additional functional criteria and insurance authorization.   Follow Up Recommendations  No OT follow up     Assistance Recommended at Discharge PRN  Patient can return home with the following Assist for transportation    Functional Status Assessment  Patient has had a recent decline in their functional status and demonstrates the ability to make significant improvements in function in a reasonable and predictable amount of time.  Equipment Recommendations  None recommended by OT       Precautions / Restrictions Precautions Precautions: Fall Restrictions Weight Bearing Restrictions: No      Mobility Bed Mobility Overal bed mobility: Independent                  Transfers Overall transfer level: Modified independent                        Balance Overall balance assessment: Modified Independent             ADL either performed or assessed  with clinical judgement   ADL Overall ADL's : Modified independent         General ADL Comments: pt report he is moving more cautiously than normal, and is fatigued after simple tasks     Vision Baseline Vision/History: 0 No visual deficits Vision Assessment?: No apparent visual deficits     Perception Perception Perception Tested?: No   Praxis Praxis Praxis tested?: Not tested    Pertinent Vitals/Pain Pain Assessment Pain Assessment: Faces Faces Pain Scale: Hurts a little bit Pain Location: head Pain Descriptors / Indicators: Headache Pain Intervention(s): Limited activity within patient's tolerance, Monitored during session     Hand Dominance Right   Extremity/Trunk Assessment Upper Extremity Assessment Upper Extremity Assessment: Overall WFL for tasks assessed   Lower Extremity Assessment Lower Extremity Assessment: Overall WFL for tasks assessed   Cervical / Trunk Assessment Cervical / Trunk Assessment: Normal   Communication Communication Communication: No difficulties   Cognition Arousal/Alertness: Awake/alert Behavior During Therapy: WFL for tasks assessed/performed Overall Cognitive Status: Within Functional Limits for tasks assessed                 General Comments: pt had some concerns with his STM - Short Blessed Test completed with mild deficits noted in immediate recall only     General Comments  VSS on RA, spouse present     Home Living Family/patient expects to be discharged to:: Private residence Living Arrangements: Spouse/significant other;Children Available Help  at Discharge: Family Type of Home: House Home Access: Stairs to enter [employer] of Steps: 2   Home Layout: Bed/bath upstairs     Bathroom Shower/Tub: Tub/shower unit;Walk-in shower   Bathroom Toilet: Standard     Home Equipment: None          Prior Functioning/Environment Prior Level of Function : Independent/Modified Independent              Mobility Comments: 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)          OT Problem List: Decreased activity tolerance;Pain         OT Goals(Current goals can be found in the care plan section) Acute Rehab OT Goals Patient Stated Goal: to be back 100% OT Goal Formulation: With patient Time For Goal Achievement: 12/04/22 Potential to Achieve Goals: Good   AM-PAC OT "6 Clicks" Daily Activity     Outcome Measure Help from another person eating meals?: None Help from another person taking care of personal grooming?: None Help from another person toileting, which includes using toliet, bedpan, or urinal?: None Help from another person bathing (including washing, rinsing, drying)?: None Help from another person to put on and taking off regular upper body clothing?: None Help from another person to put on and taking off regular lower body clothing?: None 6 Click Score: 24   End of Session    Activity Tolerance: Patient tolerated treatment well Patient left:    OT Visit Diagnosis: Other abnormalities of gait and mobility (R26.89)                Time: 1023-1035 OT Time Calculation (min): 12 min Charges:  OT General Charges $OT Visit: 1 Visit OT Evaluation $OT Eval Low Complexity: 1 Low   Tema Alire D Causey 12/04/2022, 11:12 AM

## 2022-12-06 LAB — PROTEIN C, TOTAL: Protein C, Total: 126 % (ref 60–150)

## 2022-12-08 LAB — PROTEIN S ACTIVITY: Protein S Activity: 32 % — ABNORMAL LOW (ref 63–140)

## 2022-12-08 LAB — LUPUS ANTICOAGULANT PANEL
DRVVT: 31.9 s (ref 0.0–47.0)
PTT Lupus Anticoagulant: 32.3 s (ref 0.0–43.5)

## 2022-12-10 ENCOUNTER — Ambulatory Visit (HOSPITAL_COMMUNITY)
Admission: EM | Admit: 2022-12-10 | Discharge: 2022-12-10 | Disposition: A | Discharge status: Home / Self Care | Payer: Federal, State, Local not specified - PPO | Attending: Family Medicine | Admitting: Family Medicine

## 2022-12-10 ENCOUNTER — Encounter (HOSPITAL_COMMUNITY): Payer: Self-pay

## 2022-12-10 DIAGNOSIS — M329 Systemic lupus erythematosus, unspecified: Secondary | ICD-10-CM

## 2022-12-10 DIAGNOSIS — I63532 Cerebral infarction due to unspecified occlusion or stenosis of left posterior cerebral artery: Secondary | ICD-10-CM

## 2022-12-10 DIAGNOSIS — I63542 Cerebral infarction due to unspecified occlusion or stenosis of left cerebellar artery: Secondary | ICD-10-CM

## 2022-12-10 DIAGNOSIS — D6859 Other primary thrombophilia: Secondary | ICD-10-CM | POA: Diagnosis not present

## 2022-12-10 DIAGNOSIS — R42 Dizziness and giddiness: Secondary | ICD-10-CM

## 2022-12-10 NOTE — ED Triage Notes (Signed)
Pt is here for f/up care after recent discharge from ED ON 12/03/2022 for cerebellar stroke. Pt reports headache, dizziness and left sided numbness. Pt reports he is in the process of finding establishing care with a PCP.

## 2022-12-10 NOTE — ED Provider Notes (Signed)
[facility]    CSN: [id number] Arrival date & time: 12/10/22  4098      History   Chief Complaint Chief Complaint  Patient presents with   Follow-up   Headache   Dizziness    HPI Austin [name] is a 47 y.o. male.   Pleasant 47 year old male presents today due to concerns of a recent stroke.  Was seen in the emergency room and subsequently admitted on 12/03/2022.  He was found to have had a left posterior inferior cerebellar occlusion, causing a cerebellar CVA.  Patient was seen by neurology in the hospital, was started on aspirin, atorvastatin, Plavix.  He had a rather extensive workup.  Lipids, cardiovascular evaluation thus far, normal.  Patient is scheduled to have a TEE outpatient.  Patient does have a longstanding history of known lupus, states he saw a rheumatologist originally in 2014, was on Plaquenil for several years, but could not tolerate the side effects, and thus stopped taking it.  He has not seen rheumatology in at least 7 years.  Patient went to the ER due to a headache and dizziness, in which the CVA was noted.  Patient states he is still having the dizziness, and could not drive himself here.  He was driven by his mother.  He is still having some residual tingling in his left upper extremity, but denies any weakness.  He had physical therapy several times while inpatient, and was discharged without PT due to not needing it.  He presents today primarily to request help in finding a PCP.  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  He denies any acute complaints today.   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[place])    Nephritis    Pancytopenia ([place])     Patient Active Problem List    Diagnosis Date Noted   Acute CVA (cerebrovascular accident) ([place]) 12/03/2022   Hypokalemia 12/03/2022   Lupus (systemic lupus erythematosus) ([place]) 10/02/2013   Hyponatremia 10/02/2013   Acute renal failure ([place]) 09/30/2013   Other pancytopenia ([place]) 09/29/2013   Joint pain 09/28/2013   HIP PAIN, LEFT, CHRONIC 02/18/2009    Past Surgical History:  Procedure Laterality Date   RENAL BIOPSY  2003   TONSILLECTOMY         Home Medications    Prior to Admission medications   Medication Sig Start Date End Date Taking? Authorizing Provider  aspirin EC 81 MG tablet Take 1 tablet (81 mg total) by mouth daily. Swallow whole. 12/05/22   Little Ishikawa, MD  atorvastatin (LIPITOR) 80 MG tablet Take 1 tablet (80 mg total) by mouth daily. 12/05/22   Little Ishikawa, MD  clopidogrel (PLAVIX) 75 MG tablet Take 1 tablet (75 mg total) by mouth daily. 12/05/22   Little Ishikawa, MD    Family History Family History  Problem Relation Age of Onset   Hypertension Maternal Grandmother    Diabetes Maternal Grandmother    Hypertension Maternal Grandfather    Cancer Maternal Grandfather        prostate   Hyperlipidemia Father     Social History Social History   Tobacco Use   Smoking status: Never   Smokeless tobacco: Never  Substance Use Topics  Alcohol use: Yes    Comment: rare   Drug use: No     Allergies   Patient has no known allergies.   Review of Systems Review of Systems  Neurological:  Positive for dizziness and headaches.  All other systems reviewed and are negative.    Physical Exam Triage Vital Signs ED Triage Vitals  Enc Vitals Group     BP 12/10/22 0821 139/72     Pulse Rate 12/10/22 0821 95     Resp 12/10/22 0821 16     Temp 12/10/22 0823 98.4 F (36.9 C)     Temp Source 12/10/22 0823 Oral     SpO2 12/10/22 0821 100 %     Weight --      Height --      Head Circumference --      Peak Flow --      Pain Score --      Pain Loc --      Pain  Edu? --      Excl. in [place]? --    No data found.  Updated Vital Signs BP 139/72 (BP Location: Left Arm)   Pulse 95   Temp 98.4 F (36.9 C) (Oral)   Resp 16   SpO2 100%   Visual Acuity Right Eye Distance:   Left Eye Distance:   Bilateral Distance:    Right Eye Near:   Left Eye Near:    Bilateral Near:     Physical Exam Vitals and nursing note reviewed.  Constitutional:      General: He is not in acute distress.    Appearance: Normal appearance. He is well-developed and normal weight. He is not ill-appearing or toxic-appearing.  HENT:     Head: Normocephalic and atraumatic.     Right Ear: Tympanic membrane, ear canal and external ear normal. There is no impacted cerumen.     Left Ear: Tympanic membrane, ear canal and external ear normal. There is no impacted cerumen.     Nose: Nose normal. No congestion or rhinorrhea.     Mouth/Throat:     Mouth: Mucous membranes are moist.     Pharynx: Oropharynx is clear. No oropharyngeal exudate or posterior oropharyngeal erythema.  Eyes:     General: No visual field deficit or scleral icterus.       Right eye: No discharge.        Left eye: No discharge.     Extraocular Movements: Extraocular movements intact.     Right eye: Normal extraocular motion.     Left eye: Normal extraocular motion.     Conjunctiva/sclera: Conjunctivae normal.     Pupils: Pupils are equal, round, and reactive to light.     Right eye: Pupil is reactive.     Left eye: Pupil is reactive.  Cardiovascular:     Rate and Rhythm: Normal rate and regular rhythm.     Pulses: Normal pulses.     Heart sounds: No murmur heard. Pulmonary:     Effort: Pulmonary effort is normal. No respiratory distress.     Breath sounds: Normal breath sounds. No stridor. No rhonchi.  Chest:     Chest wall: No tenderness.  Musculoskeletal:        General: No swelling, tenderness, deformity or signs of injury. Normal range of motion.     Cervical back: Normal range of motion and neck  supple. No rigidity or tenderness.     Right lower leg: No edema.  Lymphadenopathy:  Cervical: No cervical adenopathy.  Skin:    General: Skin is warm.     Coloration: Skin is not jaundiced.     Findings: No bruising, erythema, lesion or rash.  Neurological:     General: No focal deficit present.     Mental Status: He is alert and oriented to person, place, and time. Mental status is at baseline.     Cranial Nerves: Cranial nerves 2-12 are intact. No cranial nerve deficit, dysarthria or facial asymmetry.     Sensory: Sensation is intact. No sensory deficit.     Motor: Motor function is intact. No weakness, tremor, atrophy, abnormal muscle tone or pronator drift.     Coordination: Coordination is intact. Coordination normal. Finger-Nose-Finger Test normal.     Deep Tendon Reflexes: Reflexes are normal and symmetric.  Psychiatric:        Mood and Affect: Mood normal.        Behavior: Behavior normal.        Thought Content: Thought content normal.        Judgment: Judgment normal.      UC Treatments / Results  Labs (all labs ordered are listed, but only abnormal results are displayed) Labs Reviewed - No data to display  EKG   Radiology No results found.  Procedures Procedures (including critical care time)  Medications Ordered in UC Medications - No data to display  Initial Impression / Assessment and Plan / UC Course  I have reviewed the triage vital signs and the nursing notes.  Pertinent labs & imaging results that were available during my care of the patient were reviewed by me and considered in my medical decision making (see chart for details).     Dizziness - neuro exam normal. Likely secondary to recent cerebellar infarct. Pt has appointment with neuro on 01/25/23 L PICA occlusion -noted inpatient.  Patient initiated on Plavix, atorvastatin, aspirin.  Likely secondary to his hypercoagulable state. Lupus -patient to establish care with rheumatology outpatient  to discuss treatment options. 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  Works for the post office, physically demanding job.  Patient cannot drive at this time due to his dizziness.  Patient to be out of work until follow-up with his new PCP 1 week from today.   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 Keep your neurology appointment in February as discussed. You must establish care with a PCP as soon as possible for any work-related documentation. Please ensure you remain hydrated and drink plenty of water. Take your medications from the hospital as prescribed. Please call local rheumatology clinics and schedule a follow-up as soon as possible.    ED Prescriptions   None    PDMP not reviewed this encounter.   Chaney Malling, [place] 12/10/22 1005

## 2022-12-10 NOTE — Discharge Instructions (Signed)
Your stroke was likely secondary to your protein S deficiency and your lupus. Keep your neurology appointment in February as discussed. You must establish care with a PCP as soon as possible for any work-related documentation. Please ensure you remain hydrated and drink plenty of water. Take your medications from the hospital as prescribed. Please call local rheumatology clinics and schedule a follow-up as soon as possible.

## 2022-12-13 LAB — PROTHROMBIN GENE MUTATION

## 2022-12-14 LAB — FACTOR 5 LEIDEN

## 2022-12-16 ENCOUNTER — Ambulatory Visit: Payer: Federal, State, Local not specified - PPO | Admitting: Family

## 2022-12-16 ENCOUNTER — Encounter: Payer: Self-pay | Admitting: Family

## 2022-12-16 VITALS — BP 120/79 | HR 81 | Temp 97.7°F | Ht 77.0 in | Wt 241.0 lb

## 2022-12-16 DIAGNOSIS — Z8673 Personal history of transient ischemic attack (TIA), and cerebral infarction without residual deficits: Secondary | ICD-10-CM

## 2022-12-16 DIAGNOSIS — E876 Hypokalemia: Secondary | ICD-10-CM

## 2022-12-16 DIAGNOSIS — M328 Other forms of systemic lupus erythematosus: Secondary | ICD-10-CM

## 2022-12-16 DIAGNOSIS — Z1211 Encounter for screening for malignant neoplasm of colon: Secondary | ICD-10-CM | POA: Diagnosis not present

## 2022-12-16 LAB — BASIC METABOLIC PANEL
BUN: 12 mg/dL (ref 6–23)
CO2: 30 mEq/L (ref 19–32)
Calcium: 9 mg/dL (ref 8.4–10.5)
Chloride: 106 mEq/L (ref 96–112)
Creatinine, Ser: 0.96 mg/dL (ref 0.40–1.50)
GFR: 93.17 mL/min (ref >60.00)
Glucose, Bld: 96 mg/dL (ref 70–99)
Potassium: 4.1 mEq/L (ref 3.5–5.1)
Sodium: 140 mEq/L (ref 135–145)

## 2022-12-16 MED ORDER — CLOPIDOGREL BISULFATE 75 MG PO TABS: 75.0000 mg | ORAL_TABLET | Freq: Every day | ORAL | 0 refills | Status: DC

## 2022-12-16 NOTE — Assessment & Plan Note (Addendum)
11/2022 - hosp 2d inferomedial left cerebellar CVA - possibly d/t untreated Lupus reports main sx were dizziness, feeling off balance, worsening tinnitus - has had tinnitus in the past off & on lipids good but started on Lipitor  taking Plavix 75mg  qd, ASA 81mg  Neuro appt on 2/20 sending refill of Plavix - advised to discuss future refills at Neuro appt pt has work note via UC until 1/18 - he will let me know if needed longer f/u prn

## 2022-12-16 NOTE — Assessment & Plan Note (Addendum)
chronic, untreated for years dx 2014 - Renal bx 2014: consistent with class 2 lupus glomeronephritis, mesangial lupus nephritis  reports not tolerating Plaquenil in past and then stopped following with Rheumatology - states provider was located on [address] and he thinks doctor name was Ouida Sills? see no reference in previous PCP notes, only to rheumatology appt w/Cardiology on 1/23 - waiting on TEE sending new referral

## 2022-12-16 NOTE — Patient Instructions (Addendum)
Welcome to [facility], It was a pleasure meeting you today!   I will review your lab results via MyChart message.  I have sent a refill of your Plavix - be sure to discuss whether you are to continue this medication at your Neurology appointment.  Let me know if you need a work note past 1/18 based on how you are feeling. If you think you can go back but with restricted duties, I can write that in the note.  I have sent a referral to gastroenterology, and rheumatology.     PLEASE NOTE: If you had any LAB tests please let us know if you have not heard back within a few days. You may see your results on MyChart before we have a chance to review them but we will give you a call once they are reviewed by [place]. If we ordered any REFERRALS today, please let us know if you have not heard from their office within the next week.  Let us know through MyChart if you are needing REFILLS, or have your pharmacy send [place] the request. You can also use MyChart to communicate with me or any office staff.

## 2022-12-16 NOTE — Progress Notes (Signed)
New Patient Office Visit  Subjective:  Patient ID: Austin [name], male    DOB: June 21, 1976  Age: 47 y.o. MRN: [id number]  CC:  Chief Complaint  Patient presents with   New Patient (Initial Visit)   Tinnitus    Pt c/o ringing in his ears since he left the hospital on 12/31; s/p CVA    HPI Austin [name] presents for establishing care today.  Recent CVA dx:  CVA via [facility], hospital 12/29-12/30, seen in UC d/t ongoing dizziness & tinnitus on 1/5. 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 Has f/u with neurology in Feb. Also has Lupus. Reports having some tinnitus prior to CVA, going back to childhood, seen by specialists but he does not remember if a cause was ever found. Pt also d/c with hypokalemia.   Lupus:  dx 2014 - Renal bx 2014: consistent with class 2 lupus glomeronephritis, mesangial lupus nephritis, reports not tolerating Plaquenil in past and then stopped following with Rheumatology - states provider was located on [address] and he thinks doctor name was Ouida Sills - see reference in previous PCP notes, but no notes. Pt has appt with Cardiology on 1/23 & waiting on TEE.   Assessment & Plan:   Problem List Items Addressed This Visit       Other   Lupus (systemic lupus erythematosus) ([place]) - Primary    chronic, untreated for years dx 2014 - Renal bx 2014: consistent with class 2 lupus glomeronephritis, mesangial lupus nephritis  reports not tolerating Plaquenil in past and then stopped following with Rheumatology - states provider was located on [address] and he thinks doctor name was Ouida Sills? see no reference in previous PCP notes, only to rheumatology appt w/Cardiology on 1/23 - waiting on TEE sending new referral      Relevant Orders   Ambulatory referral to Rheumatology   Hypokalemia   Relevant Orders   Basic Metabolic Panel (BMET) (Completed)   Status post CVA    11/2022 - hosp 2d inferomedial left cerebellar CVA - possibly  d/t untreated Lupus reports main sx were dizziness, feeling off balance, worsening tinnitus - has had tinnitus in the past off & on lipids good but started on Lipitor  taking Plavix 75mg  qd, ASA 81mg  Neuro appt on 2/20 sending refill of Plavix - advised to discuss future refills at Neuro appt pt has work note via UC until 1/18 - he will let me know if needed longer f/u prn      Relevant Medications   clopidogrel (PLAVIX) 75 MG tablet   Other Visit Diagnoses     Colon cancer screening       Relevant Orders   Ambulatory referral to Gastroenterology      Subjective:    Outpatient Medications Prior to Visit  Medication Sig Dispense Refill   aspirin EC 81 MG tablet Take 1 tablet (81 mg total) by mouth daily. Swallow whole. 30 tablet 11   atorvastatin (LIPITOR) 80 MG tablet Take 1 tablet (80 mg total) by mouth daily. 30 tablet 1   clopidogrel (PLAVIX) 75 MG tablet Take 1 tablet (75 mg total) by mouth daily. 20 tablet 0   No facility-administered medications prior to visit.   Past Medical History:  Diagnosis Date   HYPERLIPIDEMIA 02/18/2009   HYPERTENSION 02/18/2009   Lupus glomerulonephritis ([place])    Nephritis    Pancytopenia ([place])    Past Surgical History:  Procedure Laterality Date   RENAL BIOPSY  12/06/2001   TONSILLECTOMY     WISDOM TOOTH EXTRACTION      Objective:   Today's Vitals: BP 120/79 (BP Location: Left Arm, Patient Position: Sitting, Cuff Size: Large)   Pulse 81   Temp 97.7 F (36.5 C) (Temporal)   Ht 6\' 5"  (1.956 m)   Wt 241 lb (109.3 kg)   SpO2 97%   BMI 28.58 kg/m   Physical Exam Vitals and nursing note reviewed.  Constitutional:      General: He is not in acute distress.    Appearance: Normal appearance.  HENT:     Head: Normocephalic.  Cardiovascular:     Rate and Rhythm: Normal rate and regular rhythm.  Pulmonary:     Effort: Pulmonary effort is normal.     Breath sounds: Normal breath sounds.  Musculoskeletal:        General: Normal  range of motion.     Cervical back: Normal range of motion.  Skin:    General: Skin is warm and dry.  Neurological:     Mental Status: He is alert and oriented to person, place, and time.  Psychiatric:        Mood and Affect: Mood normal.    Meds ordered this encounter  Medications   clopidogrel (PLAVIX) 75 MG tablet    Sig: Take 1 tablet (75 mg total) by mouth daily.    Dispense:  30 tablet    Refill:  0    Order Specific Question:   Supervising Provider    Answer:   ANDY, CAMILLE L [[id number]]    Jeanie Sewer, NP

## 2022-12-17 ENCOUNTER — Encounter: Payer: Self-pay | Admitting: Family

## 2022-12-19 NOTE — Progress Notes (Signed)
Your potassium is back in normal range. Glucose, other electrolytes & kidney function are also within normal limits.

## 2022-12-20 ENCOUNTER — Telehealth: Payer: Self-pay | Admitting: Family

## 2022-12-20 ENCOUNTER — Encounter: Payer: Self-pay | Admitting: Internal Medicine

## 2022-12-20 NOTE — Telephone Encounter (Signed)
error 

## 2022-12-22 ENCOUNTER — Encounter: Payer: Self-pay | Admitting: Family

## 2022-12-22 ENCOUNTER — Ambulatory Visit: Payer: Federal, State, Local not specified - PPO | Admitting: Internal Medicine

## 2022-12-22 ENCOUNTER — Ambulatory Visit: Payer: Federal, State, Local not specified - PPO | Admitting: Family

## 2022-12-22 VITALS — BP 118/81 | HR 75 | Temp 97.5°F | Ht 77.0 in | Wt 249.0 lb

## 2022-12-22 DIAGNOSIS — Z8673 Personal history of transient ischemic attack (TIA), and cerebral infarction without residual deficits: Secondary | ICD-10-CM

## 2022-12-22 NOTE — Progress Notes (Signed)
Patient ID: Austin [name], male    DOB: 03/15/1976, 47 y.o.   MRN: [id number]  Chief Complaint  Patient presents with   FMLA    Pt would like a letter until February 1st due to dizziness.  Scheduled to see rheumatologist tomorrow.     HPI: Recent CVA dx:  CVA via [facility], hospital 12/29-12/30, seen in UC d/t ongoing dizziness & tinnitus on 1/5. 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 Has f/u with neurology in Feb. Also has Lupus and has an appointment with rheumatology tomorrow and Cardiology next week. Reports having some tinnitus prior to CVA, going back to childhood, seen by specialists but he does not remember if a cause was ever found. pt still reporting ongoing dizziness & tinnintus. States he can't walk and talk on phone w/o inducing sx, or drive and talk on phone. Pt still unable to return to work. Pt was given work excuse note via urgent care good through today, he has FMLA paperwork to be completed for an extension.  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 1 f/u prn       Subjective:    Outpatient Medications Prior to Visit  Medication Sig Dispense Refill   aspirin EC 81 MG tablet Take 1 tablet (81 mg total) by mouth daily. Swallow whole. 30 tablet 11   atorvastatin (LIPITOR) 80 MG tablet Take 1 tablet (80 mg total) by mouth daily. 30 tablet 1   clopidogrel (PLAVIX) 75 MG tablet Take 1 tablet (75 mg total) by mouth daily. 30 tablet 0   No facility-administered medications prior to visit.   Past Medical History:  Diagnosis Date   HIP PAIN, LEFT, CHRONIC  02/18/2009   Qualifier: Diagnosis of   By: Elease Hashimoto MD, Bruce       HYPERLIPIDEMIA 02/18/2009   HYPERTENSION 02/18/2009   Hypokalemia 12/03/2022   Hyponatremia 10/02/2013   Lupus glomerulonephritis ([place])    Nephritis    Pancytopenia ([place])    Past Surgical History:  Procedure Laterality Date   RENAL BIOPSY  12/06/2001   TONSILLECTOMY     WISDOM TOOTH EXTRACTION     No Known Allergies    Objective:    Physical Exam Vitals and nursing note reviewed.  Constitutional:      General: He is not in acute distress.    Appearance: Normal appearance.  HENT:     Head: Normocephalic.  Cardiovascular:     Rate and Rhythm: Normal rate and regular rhythm.  Pulmonary:     Effort: Pulmonary effort is normal.     Breath sounds: Normal breath sounds.  Musculoskeletal:        General: Normal range of motion.     Cervical back: Normal range of motion.  Skin:    General: Skin is warm and dry.  Neurological:     Mental Status: He is alert and oriented to person, place, and time.  Psychiatric:        Mood and Affect: Mood normal.    BP 118/81 (  BP Location: Left Arm, Patient Position: Sitting, Cuff Size: Large)   Pulse 75   Temp (!) 97.5 F (36.4 C) (Temporal)   Ht 6\' 5"  (1.956 m)   Wt 249 lb (112.9 kg)   SpO2 96%   BMI 29.53 kg/m  Wt Readings from Last 3 Encounters:  12/22/22 249 lb (112.9 kg)  12/16/22 241 lb (109.3 kg)  12/03/22 244 lb (110.7 kg)   *Extra time (62min) spent with patient today which consisted of chart review, discussing diagnoses, answering questions, and documentation including completion of FMLA paperwork.   Jeanie Sewer, NP

## 2022-12-22 NOTE — Assessment & Plan Note (Addendum)
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 1 f/u prn

## 2022-12-23 DIAGNOSIS — M329 Systemic lupus erythematosus, unspecified: Secondary | ICD-10-CM | POA: Diagnosis not present

## 2022-12-23 DIAGNOSIS — Z7952 Long term (current) use of systemic steroids: Secondary | ICD-10-CM | POA: Diagnosis not present

## 2022-12-23 DIAGNOSIS — M2550 Pain in unspecified joint: Secondary | ICD-10-CM | POA: Diagnosis not present

## 2022-12-23 DIAGNOSIS — M3214 Glomerular disease in systemic lupus erythematosus: Secondary | ICD-10-CM | POA: Diagnosis not present

## 2022-12-24 ENCOUNTER — Encounter: Payer: Self-pay | Admitting: Family

## 2022-12-24 NOTE — Telephone Encounter (Signed)
I called pt in regards, Pt will pick up FMLA Forms on Monday

## 2022-12-28 ENCOUNTER — Encounter: Payer: Self-pay | Admitting: Internal Medicine

## 2022-12-28 ENCOUNTER — Ambulatory Visit: Payer: Federal, State, Local not specified - PPO | Attending: Internal Medicine | Admitting: Internal Medicine

## 2022-12-28 VITALS — BP 112/72 | HR 81 | Ht 77.0 in | Wt 250.6 lb

## 2022-12-28 DIAGNOSIS — Z01812 Encounter for preprocedural laboratory examination: Secondary | ICD-10-CM

## 2022-12-28 NOTE — Patient Instructions (Addendum)
Medication Instructions:  Your physician recommends that you continue on your current medications as directed. Please refer to the Current Medication list given to you today.  *If you need a refill on your cardiac medications before your next appointment, please call your pharmacy*   Lab Work: Your physician recommends that you return 1 week prior to your TEE to have the following labs drawn: BMET and CBC  If you have labs (blood work) drawn today and your tests are completely normal, you will receive your results only by: [place] (if you have MyChart) OR A paper copy in the mail If you have any lab test that is abnormal or we need to change your treatment, we will call you to review the results.   Testing/Procedures: Your physician has requested that you have a TEE. During a TEE, sound waves are used to create images of your heart. It provides your doctor with information about the size and shape of your heart and how well your heart's chambers and valves are working. In this test, a transducer is attached to the end of a flexible tube that's guided down your throat and into your esophagus (the tube leading from you mouth to your stomach) to get a more detailed image of your heart. You are not awake for the procedure. Please see the instruction sheet given to you today. For further information please visit [email or web address].     Follow-Up: At [facility], you and your health needs are our priority.  As part of our continuing mission to provide you with exceptional heart care, we have created designated Provider Care Teams.  These Care Teams include your primary Cardiologist (physician) and Advanced Practice Providers (APPs -  Physician Assistants and Nurse Practitioners) who all work together to provide you with the care you need, when you need it.  We recommend signing up for the patient portal called "MyChart".  Sign up information is provided on this After Visit Summary.   MyChart is used to connect with patients for Virtual Visits (Telemedicine).  Patients are able to view lab/test results, encounter notes, upcoming appointments, etc.  Non-urgent messages can be sent to your provider as well.   To learn more about what you can do with MyChart, go to [email or web address].    Your next appointment:   As Needed pending TEE results   Provider:   Janina Mayo, MD     Other Instructions    Dear Austin [name]  You are scheduled for a TEE (Transesophageal Echocardiogram) on Monday, February 12 with Dr. Phineas Inches, MD .  Please arrive at the [facility] (Main Entrance A) at [facility]: [place],  [id number] at 12:00 PM.    DIET:  Nothing to eat or drink after midnight except a sip of water with medications (see medication instructions below)  MEDICATION INSTRUCTIONS:   LABS:   Come to [address] (Dr. Nelly Laurence office) Between the hours of 8am and 4:30pm on  01/10/2023 to have your pre procedural labs drawn.   FYI:  For your safety, and to allow [place] to monitor your vital signs accurately during the surgery/procedure we request: If you have artificial nails, gel coating, SNS etc, please have those removed prior to your surgery/procedure. Not having the nail coverings /polish removed may result in cancellation or delay of your surgery/procedure.  You must have a responsible person to drive you home and stay in the waiting area during your procedure.  Failure to do so could result in cancellation.  Bring your insurance cards.  *Special Note: Every effort is made to have your procedure done on time. Occasionally there are emergencies that occur at the hospital that may cause delays. Please be patient if a delay does occur.

## 2022-12-28 NOTE — Progress Notes (Signed)
Cardiology Office Note:    Date:  12/28/2022   ID:  VINIT GILLS, DOB 26-Jul-1976, MRN [id number]  PCP:  Jeanie Sewer, NP   [place] Providers Cardiologist:  None     Referring MD: Eulas Post, MD   No chief complaint on file. Stroke  History of Present Illness:    Austin [name] is a 47 y.o. 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 He was started on DAPT and lipitor. He is maintained on plaquenil. He follows with rheumatology. He was referred to cardiology for TEE to assess for a cardioembolic stroke. His surface echo showed normal LV/RV function. No valve disease. No interatrial shunt via doppler. No bubble study. Septum looks very normal. Prior EKG shows sinus rhythm. He has no cardiac dx history or symptoms.  Past Medical History:  Diagnosis Date   HIP PAIN, LEFT, CHRONIC 02/18/2009   Qualifier: Diagnosis of   By: Elease Hashimoto MD, Bruce       HYPERLIPIDEMIA 02/18/2009   HYPERTENSION 02/18/2009   Hypokalemia 12/03/2022   Hyponatremia 10/02/2013   Lupus glomerulonephritis ([place])    Nephritis    Pancytopenia ([place])     Past Surgical History:  Procedure Laterality Date   RENAL BIOPSY  12/06/2001   TONSILLECTOMY     WISDOM TOOTH EXTRACTION      Current Medications: Current Outpatient Medications on File Prior to Visit  Medication Sig Dispense Refill   aspirin EC 81 MG tablet Take 1 tablet (81 mg total) by mouth daily. Swallow whole. 30 tablet 11   atorvastatin (LIPITOR) 80 MG tablet Take 1 tablet (80 mg total) by mouth daily. 30 tablet 1   clopidogrel (PLAVIX) 75 MG tablet Take 1 tablet (75 mg total) by mouth daily. 30 tablet 0   hydroxychloroquine (PLAQUENIL) 200 MG tablet Take 400 mg by mouth daily.     No current facility-administered medications on file prior to visit.     Allergies:   Patient has no known allergies.   Social History    Socioeconomic History   Marital status: Married    Spouse name: Not on file   Number of children: Not on file   Years of education: Not on file   Highest education level: Not on file  Occupational History   Not on file  Tobacco Use   Smoking status: Never   Smokeless tobacco: Never  Vaping Use   Vaping Use: Never used  Substance and Sexual Activity   Alcohol use: Yes    Comment: rare   Drug use: No   Sexual activity: Yes  Other Topics Concern   Not on file  Social History Narrative   Not on file   Social Determinants of Health   Financial Resource Strain: Not on file  Food Insecurity: No Food Insecurity (12/03/2022)   Hunger Vital Sign    Worried About Running Out of Food in the Last Year: Never true    Ran Out of Food in the Last Year: Never true  Transportation Needs: No Transportation Needs (12/03/2022)   PRAPARE - Hydrologist (Medical): No    Lack of Transportation (Non-Medical): No  Physical Activity: Not on file  Stress: Not on file  Social Connections: Not on file     Family History: The patient's family history includes Cancer in his maternal grandfather; Diabetes in his maternal grandmother; Hyperlipidemia in  his father; Hypertension in his maternal grandfather and maternal grandmother.  ROS:   Please see the history of present illness.     All other systems reviewed and are negative.  EKGs/Labs/Other Studies Reviewed:    The following studies were reviewed today:   EKG:  Recent Labs: 12/03/2022: ALT 17; Hemoglobin 13.2; Magnesium 1.7; Platelets 171 12/16/2022: BUN 12; Creatinine, Ser 0.96; Potassium 4.1; Sodium 140   Recent Lipid Panel    Component Value Date/Time   CHOL 134 12/04/2022 0324   TRIG 107 12/04/2022 0324   HDL 34 (L) 12/04/2022 0324   CHOLHDL 3.9 12/04/2022 0324   VLDL 21 12/04/2022 0324   LDLCALC 79 12/04/2022 0324     Risk Assessment/Calculations:         Physical Exam:    VS:   Vitals:    12/28/22 1550  BP: 112/72  Pulse: 81  SpO2: 99%     Wt Readings from Last 3 Encounters:  12/22/22 249 lb (112.9 kg)  12/16/22 241 lb (109.3 kg)  12/03/22 244 lb (110.7 kg)     GEN:  Well nourished, well developed in no acute distress HEENT: Normal NECK: No JVD; No carotid bruits LYMPHATICS: No lymphadenopathy CARDIAC: RRR, no murmurs, rubs, gallops RESPIRATORY:  Clear to auscultation without rales, wheezing or rhonchi  ABDOMEN: Soft, non-tender, non-distended MUSCULOSKELETAL:  No edema; No deformity  SKIN: Warm and dry NEUROLOGIC:  Alert and oriented x 3 PSYCHIATRIC:  Normal affect   ASSESSMENT:    L PICA stenosis, Acute CVA : low suspicion for cardioembolic source. Will plan for TEE to ensure.  PLAN:    In order of problems listed above:  TEE      Shared Decision Making/Informed Consent The risks [esophageal damage, perforation (1:10,000 risk), bleeding, pharyngeal hematoma as well as other potential complications associated with conscious sedation including aspiration, arrhythmia, respiratory failure and death], benefits (treatment guidance and diagnostic support) and alternatives of a transesophageal echocardiogram were discussed in detail with Austin [name] and he is willing to proceed.     Medication Adjustments/Labs and Tests Ordered: Current medicines are reviewed at length with the patient today.  Concerns regarding medicines are outlined above.  No orders of the defined types were placed in this encounter.  No orders of the defined types were placed in this encounter.   There are no Patient Instructions on file for this visit.   Signed, Janina Mayo, MD  12/28/2022 2:15 PM    [place]

## 2022-12-28 NOTE — H&P (View-Only) (Signed)
Cardiology Office Note:    Date:  12/28/2022   ID:  Austin [name], DOB 26-Jul-1976, MRN [id number]  PCP:  Jeanie Sewer, NP   [place] Providers Cardiologist:  None     Referring MD: Eulas Post, MD   No chief complaint on file. Stroke  History of Present Illness:    Austin [name] is a 47 y.o. 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 He was started on DAPT and lipitor. He is maintained on plaquenil. He follows with rheumatology. He was referred to cardiology for TEE to assess for a cardioembolic stroke. His surface echo showed normal LV/RV function. No valve disease. No interatrial shunt via doppler. No bubble study. Septum looks very normal. Prior EKG shows sinus rhythm. He has no cardiac dx history or symptoms.  Past Medical History:  Diagnosis Date   HIP PAIN, LEFT, CHRONIC 02/18/2009   Qualifier: Diagnosis of   By: Elease Hashimoto MD, Bruce       HYPERLIPIDEMIA 02/18/2009   HYPERTENSION 02/18/2009   Hypokalemia 12/03/2022   Hyponatremia 10/02/2013   Lupus glomerulonephritis ([place])    Nephritis    Pancytopenia ([place])     Past Surgical History:  Procedure Laterality Date   RENAL BIOPSY  12/06/2001   TONSILLECTOMY     WISDOM TOOTH EXTRACTION      Current Medications: Current Outpatient Medications on File Prior to Visit  Medication Sig Dispense Refill   aspirin EC 81 MG tablet Take 1 tablet (81 mg total) by mouth daily. Swallow whole. 30 tablet 11   atorvastatin (LIPITOR) 80 MG tablet Take 1 tablet (80 mg total) by mouth daily. 30 tablet 1   clopidogrel (PLAVIX) 75 MG tablet Take 1 tablet (75 mg total) by mouth daily. 30 tablet 0   hydroxychloroquine (PLAQUENIL) 200 MG tablet Take 400 mg by mouth daily.     No current facility-administered medications on file prior to visit.     Allergies:   Patient has no known allergies.   Social History    Socioeconomic History   Marital status: Married    Spouse name: Not on file   Number of children: Not on file   Years of education: Not on file   Highest education level: Not on file  Occupational History   Not on file  Tobacco Use   Smoking status: Never   Smokeless tobacco: Never  Vaping Use   Vaping Use: Never used  Substance and Sexual Activity   Alcohol use: Yes    Comment: rare   Drug use: No   Sexual activity: Yes  Other Topics Concern   Not on file  Social History Narrative   Not on file   Social Determinants of Health   Financial Resource Strain: Not on file  Food Insecurity: No Food Insecurity (12/03/2022)   Hunger Vital Sign    Worried About Running Out of Food in the Last Year: Never true    Ran Out of Food in the Last Year: Never true  Transportation Needs: No Transportation Needs (12/03/2022)   PRAPARE - Hydrologist (Medical): No    Lack of Transportation (Non-Medical): No  Physical Activity: Not on file  Stress: Not on file  Social Connections: Not on file     Family History: The patient's family history includes Cancer in his maternal grandfather; Diabetes in his maternal grandmother; Hyperlipidemia in  his father; Hypertension in his maternal grandfather and maternal grandmother.  ROS:   Please see the history of present illness.     All other systems reviewed and are negative.  EKGs/Labs/Other Studies Reviewed:    The following studies were reviewed today:   EKG:  Recent Labs: 12/03/2022: ALT 17; Hemoglobin 13.2; Magnesium 1.7; Platelets 171 12/16/2022: BUN 12; Creatinine, Ser 0.96; Potassium 4.1; Sodium 140   Recent Lipid Panel    Component Value Date/Time   CHOL 134 12/04/2022 0324   TRIG 107 12/04/2022 0324   HDL 34 (L) 12/04/2022 0324   CHOLHDL 3.9 12/04/2022 0324   VLDL 21 12/04/2022 0324   LDLCALC 79 12/04/2022 0324     Risk Assessment/Calculations:         Physical Exam:    VS:   Vitals:    12/28/22 1550  BP: 112/72  Pulse: 81  SpO2: 99%     Wt Readings from Last 3 Encounters:  12/22/22 249 lb (112.9 kg)  12/16/22 241 lb (109.3 kg)  12/03/22 244 lb (110.7 kg)     GEN:  Well nourished, well developed in no acute distress HEENT: Normal NECK: No JVD; No carotid bruits LYMPHATICS: No lymphadenopathy CARDIAC: RRR, no murmurs, rubs, gallops RESPIRATORY:  Clear to auscultation without rales, wheezing or rhonchi  ABDOMEN: Soft, non-tender, non-distended MUSCULOSKELETAL:  No edema; No deformity  SKIN: Warm and dry NEUROLOGIC:  Alert and oriented x 3 PSYCHIATRIC:  Normal affect   ASSESSMENT:    L PICA stenosis, Acute CVA : low suspicion for cardioembolic source. Will plan for TEE to ensure.  PLAN:    In order of problems listed above:  TEE      Shared Decision Making/Informed Consent The risks [esophageal damage, perforation (1:10,000 risk), bleeding, pharyngeal hematoma as well as other potential complications associated with conscious sedation including aspiration, arrhythmia, respiratory failure and death], benefits (treatment guidance and diagnostic support) and alternatives of a transesophageal echocardiogram were discussed in detail with Austin [name] and he is willing to proceed.     Medication Adjustments/Labs and Tests Ordered: Current medicines are reviewed at length with the patient today.  Concerns regarding medicines are outlined above.  No orders of the defined types were placed in this encounter.  No orders of the defined types were placed in this encounter.   There are no Patient Instructions on file for this visit.   Signed, Janina Mayo, MD  12/28/2022 2:15 PM    [place]

## 2023-01-10 ENCOUNTER — Other Ambulatory Visit: Payer: Self-pay

## 2023-01-10 DIAGNOSIS — Z01812 Encounter for preprocedural laboratory examination: Secondary | ICD-10-CM | POA: Diagnosis not present

## 2023-01-11 LAB — CBC
Hematocrit: 39.2 % (ref 37.5–51.0)
Hemoglobin: 12.8 g/dL — ABNORMAL LOW (ref 13.0–17.7)
MCH: 28.1 pg (ref 26.6–33.0)
MCHC: 32.7 g/dL (ref 31.5–35.7)
MCV: 86 fL (ref 79–97)
Platelets: 154 10*3/uL (ref 150–450)
RBC: 4.55 x10E6/uL (ref 4.14–5.80)
RDW: 13.1 % (ref 11.6–15.4)
WBC: 4 10*3/uL (ref 3.4–10.8)

## 2023-01-11 LAB — BASIC METABOLIC PANEL
BUN/Creatinine Ratio: 11 (ref 9–20)
BUN: 11 mg/dL (ref 6–24)
CO2: 25 mmol/L (ref 20–29)
Calcium: 8.8 mg/dL (ref 8.7–10.2)
Chloride: 106 mmol/L (ref 96–106)
Creatinine, Ser: 0.96 mg/dL (ref 0.76–1.27)
Glucose: 99 mg/dL (ref 70–99)
Potassium: 3.9 mmol/L (ref 3.5–5.2)
Sodium: 138 mmol/L (ref 134–144)
eGFR: 97 mL/min/{1.73_m2} (ref >59)

## 2023-01-16 ENCOUNTER — Encounter (HOSPITAL_COMMUNITY): Payer: Self-pay | Admitting: Internal Medicine

## 2023-01-16 NOTE — Anesthesia Preprocedure Evaluation (Signed)
Anesthesia Evaluation  Patient identified by MRN, date of birth, ID band Patient awake    Reviewed: Allergy & Precautions, NPO status , Patient's Chart, lab work & pertinent test results  Airway Mallampati: II       Dental no notable dental hx. (+) Dental Advisory Given, Teeth Intact   Pulmonary    Pulmonary exam normal breath sounds clear to auscultation       Cardiovascular hypertension, Normal cardiovascular exam Rhythm:Regular Rate:Normal  EKG 12/03/22 NSR with prolonged PR, non specific IVCD  TTE 12/03/22 1. Left ventricular ejection fraction, by estimation, is 60 to 65%. The  left ventricle has normal function. The left ventricle has no regional  wall motion abnormalities. Left ventricular diastolic parameters are  consistent with Grade I diastolic  dysfunction (impaired relaxation).   2. Right ventricular systolic function is normal. The right ventricular  size is normal.   3. Left atrial size was moderately dilated.   4. The mitral valve is normal in structure. No evidence of mitral valve  regurgitation. No evidence of mitral stenosis.   5. The aortic valve is tricuspid. Aortic valve regurgitation is not  visualized. No aortic stenosis is present.   6. Aortic dilatation noted. There is mild dilatation of the ascending  aorta, measuring 40 mm.   7. The inferior vena cava is normal in size with greater than 50%  respiratory variability, suggesting right atrial pressure of 3 mmHg.      Neuro/Psych CVA  negative psych ROS   GI/Hepatic negative GI ROS, Neg liver ROS,,,  Endo/Other  Hyperlipidemia  Renal/GU Renal diseaseHx/o lupus glomerulonephritis CKD  negative genitourinary   Musculoskeletal   Abdominal   Peds  Hematology  (+) Blood dyscrasia, anemia Hx/o pancytopenia Plavix therapy- last dose  SLE - on plaquenil   Anesthesia Other Findings   Reproductive/Obstetrics                               Anesthesia Physical Anesthesia Plan  ASA: 3  Anesthesia Plan: MAC   Post-op Pain Management: Minimal or no pain anticipated   Induction: Intravenous  PONV Risk Score and Plan: 1 and Treatment may vary due to age or medical condition, Propofol infusion and Ondansetron  Airway Management Planned: Natural Airway and Nasal Cannula  Additional Equipment: None  Intra-op Plan:   Post-operative Plan:   Informed Consent: I have reviewed the patients History and Physical, chart, labs and discussed the procedure including the risks, benefits and alternatives for the proposed anesthesia with the patient or authorized representative who has indicated his/her understanding and acceptance.     Dental advisory given  Plan Discussed with: CRNA and Anesthesiologist  Anesthesia Plan Comments:          Anesthesia Quick Evaluation

## 2023-01-17 ENCOUNTER — Ambulatory Visit (HOSPITAL_COMMUNITY)
Admission: RE | Admit: 2023-01-17 | Discharge: 2023-01-17 | Disposition: A | Discharge status: Home / Self Care | Payer: Federal, State, Local not specified - PPO | Attending: Internal Medicine | Admitting: Internal Medicine

## 2023-01-17 ENCOUNTER — Ambulatory Visit (HOSPITAL_COMMUNITY): Payer: Federal, State, Local not specified - PPO | Admitting: Anesthesiology

## 2023-01-17 ENCOUNTER — Encounter (HOSPITAL_COMMUNITY): Payer: Self-pay | Admitting: Internal Medicine

## 2023-01-17 ENCOUNTER — Ambulatory Visit (HOSPITAL_BASED_OUTPATIENT_CLINIC_OR_DEPARTMENT_OTHER)
Admission: RE | Admit: 2023-01-17 | Discharge: 2023-01-17 | Disposition: A | Discharge status: Home / Self Care | Payer: Federal, State, Local not specified - PPO | Source: Ambulatory Visit | Attending: Internal Medicine | Admitting: Internal Medicine

## 2023-01-17 ENCOUNTER — Other Ambulatory Visit: Payer: Self-pay | Admitting: Family

## 2023-01-17 ENCOUNTER — Other Ambulatory Visit: Payer: Self-pay

## 2023-01-17 ENCOUNTER — Encounter (HOSPITAL_COMMUNITY)
Admission: RE | Disposition: A | Discharge status: Home / Self Care | Payer: Self-pay | Source: Home / Self Care | Attending: Internal Medicine

## 2023-01-17 DIAGNOSIS — I129 Hypertensive chronic kidney disease with stage 1 through stage 4 chronic kidney disease, or unspecified chronic kidney disease: Secondary | ICD-10-CM | POA: Diagnosis not present

## 2023-01-17 DIAGNOSIS — M3214 Glomerular disease in systemic lupus erythematosus: Secondary | ICD-10-CM | POA: Insufficient documentation

## 2023-01-17 DIAGNOSIS — Z8673 Personal history of transient ischemic attack (TIA), and cerebral infarction without residual deficits: Secondary | ICD-10-CM

## 2023-01-17 DIAGNOSIS — Z79899 Other long term (current) drug therapy: Secondary | ICD-10-CM | POA: Insufficient documentation

## 2023-01-17 DIAGNOSIS — Z01812 Encounter for preprocedural laboratory examination: Secondary | ICD-10-CM

## 2023-01-17 DIAGNOSIS — I10 Essential (primary) hypertension: Secondary | ICD-10-CM | POA: Diagnosis not present

## 2023-01-17 DIAGNOSIS — I639 Cerebral infarction, unspecified: Secondary | ICD-10-CM

## 2023-01-17 DIAGNOSIS — I081 Rheumatic disorders of both mitral and tricuspid valves: Secondary | ICD-10-CM | POA: Diagnosis not present

## 2023-01-17 DIAGNOSIS — N189 Chronic kidney disease, unspecified: Secondary | ICD-10-CM | POA: Diagnosis not present

## 2023-01-17 HISTORY — PX: TEE WITHOUT CARDIOVERSION: SHX5443

## 2023-01-17 HISTORY — PX: BUBBLE STUDY: SHX6837

## 2023-01-17 LAB — ECHO TEE

## 2023-01-17 SURGERY — ECHOCARDIOGRAM, TRANSESOPHAGEAL
Anesthesia: Monitor Anesthesia Care

## 2023-01-17 MED ORDER — DEXMEDETOMIDINE HCL IN NACL 80 MCG/20ML IV SOLN
INTRAVENOUS | Status: DC | PRN
  Administered 2023-01-17 (×3): 4 ug via BUCCAL

## 2023-01-17 MED ORDER — LIDOCAINE HCL (CARDIAC) PF 100 MG/5ML IV SOSY
PREFILLED_SYRINGE | INTRAVENOUS | Status: DC | PRN
  Administered 2023-01-17: 20 mg via INTRAVENOUS
  Administered 2023-01-17: 80 mg via INTRAVENOUS

## 2023-01-17 MED ORDER — PROPOFOL 10 MG/ML IV BOLUS
INTRAVENOUS | Status: DC | PRN
  Administered 2023-01-17 (×2): 50 mg via INTRAVENOUS

## 2023-01-17 MED ORDER — PROPOFOL 500 MG/50ML IV EMUL
INTRAVENOUS | Status: DC | PRN
  Administered 2023-01-17: 200 ug/kg/min via INTRAVENOUS

## 2023-01-17 MED ORDER — GLYCOPYRROLATE 0.2 MG/ML IJ SOLN
INTRAMUSCULAR | Status: DC | PRN
  Administered 2023-01-17: .1 mg via INTRAVENOUS

## 2023-01-17 MED ORDER — SODIUM CHLORIDE 0.9 % IV SOLN: INTRAVENOUS | Status: DC

## 2023-01-17 NOTE — CV Procedure (Signed)
INDICATIONS: Stroke  PROCEDURE:   Informed consent was obtained prior to the procedure. The risks, benefits and alternatives for the procedure were discussed and the patient comprehended these risks.  Risks include, but are not limited to, cough, sore throat, vomiting, nausea, somnolence, esophageal and stomach trauma or perforation, bleeding, low blood pressure, aspiration, pneumonia, infection, trauma to the teeth and death.    After a procedural time-out, the oropharynx was anesthetized with 20% benzocaine spray.   During this procedure the patient was administered a total of 500 mg propofol to achieve and maintain moderate conscious sedation.  The patient's heart rate, blood pressure, and oxygen saturationweare monitored continuously during the procedure. The period of conscious sedation was 20 minutes, of which I was present face-to-face 100% of this time.  The transesophageal probe was inserted in the esophagus and stomach without difficulty and multiple views were obtained.  The patient was kept under observation until the patient left the procedure room.  The patient left the procedure room in stable condition.   Agitated microbubble saline contrast was administered.  COMPLICATIONS:    There were no immediate complications.  FINDINGS:  Normal LV/RV function No valve disease No PFO  RECOMMENDATIONS:     Continue to work up other sources for stroke  Time Spent Directly with the Patient:  20 minutes   Janina Mayo 01/17/2023, 8:45 AM

## 2023-01-17 NOTE — Anesthesia Postprocedure Evaluation (Signed)
Anesthesia Post Note  Patient: LEM LANS  Procedure(s) Performed: TRANSESOPHAGEAL ECHOCARDIOGRAM (TEE) BUBBLE STUDY     Patient location during evaluation: PACU Anesthesia Type: MAC Level of consciousness: awake and alert and oriented Pain management: pain level controlled Vital Signs Assessment: post-procedure vital signs reviewed and stable Respiratory status: spontaneous breathing, nonlabored ventilation and respiratory function stable Cardiovascular status: stable and blood pressure returned to baseline Postop Assessment: no apparent nausea or vomiting Anesthetic complications: no   No notable events documented.  Last Vitals:  Vitals:   01/17/23 0857 01/17/23 0905  BP: 101/62 103/82  Pulse: 78 82  Resp: 12 15  Temp:    SpO2: 93% 95%    Last Pain:  Vitals:   01/17/23 0905  TempSrc:   PainSc: 0-No pain                 Lynton Crescenzo A.

## 2023-01-17 NOTE — Discharge Instructions (Signed)

## 2023-01-17 NOTE — Transfer of Care (Signed)
Immediate Anesthesia Transfer of Care Note  Patient: Austin [name]  Procedure(s) Performed: TRANSESOPHAGEAL ECHOCARDIOGRAM (TEE) BUBBLE STUDY  Patient Location: PACU and Endoscopy Unit  Anesthesia Type:MAC  Level of Consciousness: drowsy  Airway & Oxygen Therapy: Patient Spontanous Breathing  Post-op Assessment: Report given to RN and Post -op Vital signs reviewed and stable  Post vital signs: Reviewed and stable  Last Vitals:  Vitals Value Taken Time  BP    Temp    Pulse    Resp    SpO2      Last Pain:  Vitals:   01/17/23 0722  TempSrc: Temporal  PainSc: 0-No pain         Complications: No notable events documented.

## 2023-01-17 NOTE — Interval H&P Note (Signed)
History and Physical Interval Note:  01/17/2023 8:09 AM  Austin [name]  has presented today for surgery, with the diagnosis of STROKE.  The various methods of treatment have been discussed with the patient and family. After consideration of risks, benefits and other options for treatment, the patient has consented to  Procedure(s): TRANSESOPHAGEAL ECHOCARDIOGRAM (TEE) (N/A) as a surgical intervention.  The patient's history has been reviewed, patient examined, no change in status, stable for surgery.  I have reviewed the patient's chart and labs.  Questions were answered to the patient's satisfaction.     Janina Mayo

## 2023-01-17 NOTE — Progress Notes (Deleted)
  Echocardiogram 2D Echocardiogram has been performed.  Austin [name] 01/17/2023, 8:47 AM

## 2023-01-17 NOTE — Progress Notes (Signed)
  Echocardiogram Echocardiogram Transesophageal has been performed.  Austin [name] 01/17/2023, 8:48 AM

## 2023-01-19 ENCOUNTER — Encounter (HOSPITAL_COMMUNITY): Payer: Self-pay | Admitting: Internal Medicine

## 2023-01-21 ENCOUNTER — Ambulatory Visit: Payer: Federal, State, Local not specified - PPO | Admitting: Internal Medicine

## 2023-01-25 ENCOUNTER — Ambulatory Visit: Payer: Federal, State, Local not specified - PPO | Admitting: Neurology

## 2023-01-27 ENCOUNTER — Ambulatory Visit: Payer: Federal, State, Local not specified - PPO | Admitting: Neurology

## 2023-02-07 ENCOUNTER — Ambulatory Visit: Payer: Federal, State, Local not specified - PPO | Admitting: Neurology

## 2023-02-08 ENCOUNTER — Telehealth: Payer: Self-pay | Admitting: Neurology

## 2023-02-08 ENCOUNTER — Encounter: Payer: Self-pay | Admitting: Neurology

## 2023-02-08 ENCOUNTER — Ambulatory Visit: Payer: Federal, State, Local not specified - PPO | Admitting: Neurology

## 2023-02-08 VITALS — BP 126/84 | HR 93 | Ht 77.0 in | Wt 259.0 lb

## 2023-02-08 DIAGNOSIS — I639 Cerebral infarction, unspecified: Secondary | ICD-10-CM

## 2023-02-08 DIAGNOSIS — Z8739 Personal history of other diseases of the musculoskeletal system and connective tissue: Secondary | ICD-10-CM | POA: Diagnosis not present

## 2023-02-08 NOTE — Telephone Encounter (Signed)
[employer] sent to GI [phone]

## 2023-02-08 NOTE — Patient Instructions (Signed)
I had a long d/w patient about his recent cryptogenic cerebellar stroke, risk for recurrent stroke/TIAs, personally independently reviewed imaging studies and stroke evaluation results and answered questions.Continue aspirin 81 mg daily and stop Plavix now for secondary stroke prevention and maintain strict control of hypertension with blood pressure goal below 130/90, diabetes with hemoglobin A1c goal below 6.5% and lipids with LDL cholesterol goal below 70 mg/dL. I also advised the patient to eat a healthy diet with plenty of whole grains, cereals, fruits and vegetables, exercise regularly and maintain ideal body weight .check transcranial Doppler study for PFO and repeat MRI scan of the brain with MRA of the brain.  Repeat lipid profile and protein S levels.  Follow-up with his rheumatologist for treatment for his lupus.  Followup in the future with me in 4 months or call earlier if necessary.  Stroke Prevention Some medical conditions and behaviors can lead to a higher chance of having a stroke. You can help prevent a stroke by eating healthy, exercising, not smoking, and managing any medical conditions you have. Stroke is a leading cause of functional impairment. Primary prevention is particularly important because a majority of strokes are first-time events. Stroke changes the lives of not only those who experience a stroke but also their family and other caregivers. How can this condition affect me? A stroke is a medical emergency and should be treated right away. A stroke can lead to brain damage and can sometimes be life-threatening. If a person gets medical treatment right away, there is a better chance of surviving and recovering from a stroke. What can increase my risk? The following medical conditions may increase your risk of a stroke: Cardiovascular disease. High blood pressure (hypertension). Diabetes. High cholesterol. Sickle cell disease. Blood clotting disorders (hypercoagulable  state). Obesity. Sleep disorders (obstructive sleep apnea). Other risk factors include: Being older than age 57. Having a history of blood clots, stroke, or mini-stroke (transient ischemic attack, TIA). Genetic factors, such as race, ethnicity, or a family history of stroke. Smoking cigarettes or using other tobacco products. Taking birth control pills, especially if you also use tobacco. Heavy use of alcohol or drugs, especially cocaine and methamphetamine. Physical inactivity. What actions can I take to prevent this? Manage your health conditions High cholesterol levels. Eating a healthy diet is important for preventing high cholesterol. If cholesterol cannot be managed through diet alone, you may need to take medicines. Take any prescribed medicines to control your cholesterol as told by your health care provider. Hypertension. To reduce your risk of stroke, try to keep your blood pressure below 130/80. Eating a healthy diet and exercising regularly are important for controlling blood pressure. If these steps are not enough to manage your blood pressure, you may need to take medicines. Take any prescribed medicines to control hypertension as told by your health care provider. Ask your health care provider if you should monitor your blood pressure at home. Have your blood pressure checked every year, even if your blood pressure is normal. Blood pressure increases with age and some medical conditions. Diabetes. Eating a healthy diet and exercising regularly are important parts of managing your blood sugar (glucose). If your blood sugar cannot be managed through diet and exercise, you may need to take medicines. Take any prescribed medicines to control your diabetes as told by your health care provider. Get evaluated for obstructive sleep apnea. Talk to your health care provider about getting a sleep evaluation if you snore a lot or have excessive sleepiness.  Make sure that any other  medical conditions you have, such as atrial fibrillation or atherosclerosis, are managed. Nutrition Follow instructions from your health care provider about what to eat or drink to help manage your health condition. These instructions may include: Reducing your daily calorie intake. Limiting how much salt (sodium) you use to 1,500 milligrams (mg) each day. Using only healthy fats for cooking, such as olive oil, canola oil, or sunflower oil. Eating healthy foods. You can do this by: Choosing foods that are high in fiber, such as whole grains, and fresh fruits and vegetables. Eating at least 5 servings of fruits and vegetables a day. Try to fill one-half of your plate with fruits and vegetables at each meal. Choosing lean protein foods, such as lean cuts of meat, poultry without skin, fish, tofu, beans, and nuts. Eating low-fat dairy products. Avoiding foods that are high in sodium. This can help lower blood pressure. Avoiding foods that have saturated fat, trans fat, and cholesterol. This can help prevent high cholesterol. Avoiding processed and prepared foods. Counting your daily carbohydrate intake.  Lifestyle If you drink alcohol: Limit how much you have to: 0-1 drink a day for women who are not pregnant. 0-2 drinks a day for men. Know how much alcohol is in your drink. In the [place] one drink equals one 12 oz bottle of beer (33m), one 5 oz glass of wine (1472m, or one 1 oz glass of hard liquor (4460m Do not use any products that contain nicotine or tobacco. These products include cigarettes, chewing tobacco, and vaping devices, such as e-cigarettes. If you need help quitting, ask your health care provider. Avoid secondhand smoke. Do not use drugs. Activity  Try to stay at a healthy weight. Get at least 30 minutes of exercise on most days, such as: Fast walking. Biking. Swimming. Medicines Take over-the-counter and prescription medicines only as told by your health care  provider. Aspirin or blood thinners (antiplatelets or anticoagulants) may be recommended to reduce your risk of forming blood clots that can lead to stroke. Avoid taking birth control pills. Talk to your health care provider about the risks of taking birth control pills if: You are over 35 46ars old. You smoke. You get very bad headaches. You have had a blood clot. Where to find more information American Stroke Association: www.strokeassociation.org Get help right away if: You or a loved one has any symptoms of a stroke. "BE FAST" is an easy way to remember the main warning signs of a stroke: B - Balance. Signs are dizziness, sudden trouble walking, or loss of balance. E - Eyes. Signs are trouble seeing or a sudden change in vision. F - Face. Signs are sudden weakness or numbness of the face, or the face or eyelid drooping on one side. A - Arms. Signs are weakness or numbness in an arm. This happens suddenly and usually on one side of the body. S - Speech. Signs are sudden trouble speaking, slurred speech, or trouble understanding what people say. T - Time. Time to call emergency services. Write down what time symptoms started. You or a loved one has other signs of a stroke, such as: A sudden, severe headache with no known cause. Nausea or vomiting. Seizure. These symptoms may represent a serious problem that is an emergency. Do not wait to see if the symptoms will go away. Get medical help right away. Call your local emergency services (911 in the [place] Do not drive yourself to the hospital. Summary You  can help to prevent a stroke by eating healthy, exercising, not smoking, limiting alcohol intake, and managing any medical conditions you may have. Do not use any products that contain nicotine or tobacco. These include cigarettes, chewing tobacco, and vaping devices, such as e-cigarettes. If you need help quitting, ask your health care provider. Remember "BE FAST" for warning signs of a  stroke. Get help right away if you or a loved one has any of these signs. This information is not intended to replace advice given to you by your health care provider. Make sure you discuss any questions you have with your health care provider. Document Revised: 06/05/2020 Document Reviewed: 06/23/2020 Elsevier Patient Education  [place].

## 2023-02-08 NOTE — Progress Notes (Signed)
[facility] [address] [place]. [place] [address] [phone]       OFFICE CONSULT NOTE  [name] Date of Birth:  November 26, 1976 Medical Record Number:  [id number]   Referring MD: Holli Humbles  Reason for Referral: Stroke  HPI: [name] is a pleasant 47 year old African-American male seen today for initial office consultation visit for stroke.  History is obtained from the patient and review of electronic medical records.  I have personally reviewed pertinent available imaging films in PACS.  He has past medical history of hypertension and hyperlipidemia, lupus glomerulonephritis and chronic hip pain.  Patient stated on 12/03/2022 developed sudden onset of dizziness, vertigo, nausea and ataxia along with dry heaves.  His symptoms improved somewhat and he rested.  He also complained of headache in the left temporal.  Symptoms started resolving by the time he came to the hospital.  CT head on admission showed no acute abnormality.  CT angiogram of the head and neck showed possible occlusion of the left PICA but despite repeated attempts study was limited due to poor contrast bolus timing.  MRI scan of the brain subsequently confirmed acute left inferior medial cerebellar infarct with mild edema but no mass effect.  2D echo showed ejection fraction of 60 to 65% with grade 1 diastolic dysfunction.  Lower extremity venous Dopplers were negative for DVT.  Transesophageal echo which was done subsequently as an outpatient on 01/17/2023 was normal without evidence of PFO or clot.  LDL cholesterol was 79 mg percent and hemoglobin A1c 5.3.  Hypercoagulable lab work was all negative except slightly low protein S of 32.  dsDNA was positive with titer of 19 and-8 IgG was elevated at 8.0.  Patient has been subsequently seen by rheumatologist.  Started him on Plaquenil.  Patient states he is doing better and his dizziness is improving though occasionally he gets transiently  dizzy but he can hold himself and the feeling passes.  Patient feels this episode may have been brought on by getting a COVID booster shot but he took the injection only in August 2023 and this episode occurred 4 months later hence I feel this would be unlikely related to a.  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  Denies any prior history of strokes, TIAs, seizures, migraines, DVT or pulmonary embolism.  No family history of strokes at a young age.  Patient has noticed tinnitus in his right ear since his stroke which is mild and nondisabling.  He has been started on aspirin and Plavix for 3 weeks with did well and has stopped Plavix and is currently on aspirin alone and tolerating it well without bruising or bleeding.  He is also on Lipitor 80 mg which is tolerating well without side effects.  His blood pressure is well-controlled and today it is 126/84.  ROS:   14 system review of systems is positive for dizziness, vertigo, nausea, ataxia, dry heaves all other systems negative  PMH:  Past Medical History:  Diagnosis Date   HIP PAIN, LEFT, CHRONIC 02/18/2009   Qualifier: Diagnosis of   By: Elease Hashimoto MD, Bruce       HYPERLIPIDEMIA 02/18/2009   HYPERTENSION 02/18/2009   Hypokalemia 12/03/2022   Hyponatremia 10/02/2013   Lupus glomerulonephritis ([place])    Nephritis    Pancytopenia ([place])     Social History:  Social History   Socioeconomic History   Marital status: Married    Spouse name: Not  on file   Number of children: Not on file   Years of education: Not on file   Highest education level: Not on file  Occupational History   Not on file  Tobacco Use   Smoking status: Never   Smokeless tobacco: Never  Vaping Use   Vaping Use: Never used  Substance and Sexual Activity   Alcohol use: Yes    Comment: rare   Drug use: No   Sexual activity: Yes  Other Topics Concern   Not on file  Social History Narrative   Not on file   Social Determinants of  Health   Financial Resource Strain: Not on file  Food Insecurity: No Food Insecurity (12/03/2022)   Hunger Vital Sign    Worried About Running Out of Food in the Last Year: Never true    Ran Out of Food in the Last Year: Never true  Transportation Needs: No Transportation Needs (12/03/2022)   PRAPARE - Hydrologist (Medical): No    Lack of Transportation (Non-Medical): No  Physical Activity: Not on file  Stress: Not on file  Social Connections: Not on file  Intimate Partner Violence: Not At Risk (12/03/2022)   Humiliation, Afraid, Rape, and Kick questionnaire    Fear of Current or Ex-Partner: No    Emotionally Abused: No    Physically Abused: No    Sexually Abused: No    Medications:   Current Outpatient Medications on File Prior to Visit  Medication Sig Dispense Refill   aspirin EC 81 MG tablet Take 1 tablet (81 mg total) by mouth daily. Swallow whole. 30 tablet 11   atorvastatin (LIPITOR) 80 MG tablet Take 1 tablet (80 mg total) by mouth daily. 30 tablet 1   Cyanocobalamin (VITAMIN B-12 PO) Take 1 tablet by mouth in the morning and at bedtime.     Fe Bisgly-Vit C-Vit B12-FA (GENTLE IRON PO) Take 1 tablet by mouth in the morning and at bedtime.     hydroxychloroquine (PLAQUENIL) 200 MG tablet Take 200 mg by mouth daily in the afternoon.     POTASSIUM PO Take 1 tablet by mouth in the morning.     No current facility-administered medications on file prior to visit.    Allergies:  No Known Allergies  Physical Exam General: well developed, well nourished pleasant middle-aged African-American male seated, in no evident distress Head: head normocephalic and atraumatic.   Neck: supple with no carotid or supraclavicular bruits Cardiovascular: regular rate and rhythm, no murmurs Musculoskeletal: no deformity Skin:  no rash/petichiae Vascular:  Normal pulses all extremities  Neurologic Exam Mental Status: Awake and fully alert. Oriented to place and  time. Recent and remote memory intact. Attention span, concentration and fund of knowledge appropriate. Mood and affect appropriate.  Cranial Nerves: Fundoscopic exam reveals sharp disc margins. Pupils equal, briskly reactive to light. Extraocular movements full without nystagmus. Visual fields full to confrontation. Hearing intact. Facial sensation intact. Face, tongue, palate moves normally and symmetrically.  Motor: Normal bulk and tone. Normal strength in all tested extremity muscles. Sensory.: intact to touch , pinprick , position and vibratory sensation.  Coordination: Rapid alternating movements normal in all extremities. Finger-to-nose and heel-to-shin performed accurately bilaterally. Gait and Station: Arises from chair without difficulty. Stance is normal. Gait demonstrates normal stride length and balance . Able to heel, toe and tandem walk with slight difficulty.  Reflexes: 1+ and symmetric. Toes downgoing.   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  Vascular risk factors of mild hyperlipidemia and lupus     PLAN:I had a long d/w patient about his recent cryptogenic cerebellar stroke, risk for recurrent stroke/TIAs, personally independently reviewed imaging studies and stroke evaluation results and answered questions.Continue aspirin 81 mg daily and stop Plavix now for secondary stroke prevention and maintain strict control of hypertension with blood pressure goal below 130/90, diabetes with hemoglobin A1c goal below 6.5% and lipids with LDL cholesterol goal below 70 mg/dL. I also advised the patient to eat a healthy diet with plenty of whole grains, cereals, fruits and vegetables, exercise regularly and maintain ideal body weight .check transcranial Doppler study for PFO and repeat MRI scan of the brain with MRA of  the brain.  Repeat lipid profile and protein S levels.  Follow-up with his rheumatologist for treatment for his lupus.  Followup in the future with me in 4 months or call earlier if necessary.  Greater than 50% time during this 45 minute consultation with spent in counseling and coordination of care about his cerebellar stroke and discussion about evaluation and treatment and answering  Antony Contras, MD Note: This document was prepared with digital dictation and possible smart phrase technology. Any transcriptional errors that result from this process are unintentional.

## 2023-02-09 LAB — PROTEIN S ACTIVITY: Protein S Activity: 57 % — ABNORMAL LOW (ref 63–140)

## 2023-02-09 LAB — LIPID PANEL
Chol/HDL Ratio: 3.7 ratio (ref 0.0–5.0)
Cholesterol, Total: 122 mg/dL (ref 100–199)
HDL: 33 mg/dL — ABNORMAL LOW (ref >39)
LDL Chol Calc (NIH): 50 mg/dL (ref 0–99)
Triglycerides: 245 mg/dL — ABNORMAL HIGH (ref 0–149)
VLDL Cholesterol Cal: 39 mg/dL (ref 5–40)

## 2023-02-10 ENCOUNTER — Telehealth: Payer: Self-pay | Admitting: Neurology

## 2023-02-10 DIAGNOSIS — I639 Cerebral infarction, unspecified: Secondary | ICD-10-CM

## 2023-02-10 NOTE — Telephone Encounter (Signed)
Please change the location to [facility]

## 2023-02-12 NOTE — Progress Notes (Signed)
Kindly inform the patient that the previously low protein S blood test has improved and is almost back to normal.  His cholesterol profile still shows triglycerides are high and he needs to see primary care physician to discuss medications for this

## 2023-02-14 ENCOUNTER — Telehealth: Payer: Self-pay

## 2023-02-14 NOTE — Telephone Encounter (Signed)
-----   Message from Garvin Fila, MD sent at 02/12/2023 11:46 AM EST ----- Kindly inform the patient that the previously low protein S blood test has improved and is almost back to normal.  His cholesterol profile still shows triglycerides are high and he needs to see primary care physician to discuss medications for this

## 2023-02-22 ENCOUNTER — Ambulatory Visit (HOSPITAL_COMMUNITY)
Admission: RE | Admit: 2023-02-22 | Discharge: 2023-02-22 | Disposition: A | Discharge status: Home / Self Care | Payer: Federal, State, Local not specified - PPO | Source: Ambulatory Visit | Attending: Neurology | Admitting: Neurology

## 2023-02-22 DIAGNOSIS — I639 Cerebral infarction, unspecified: Secondary | ICD-10-CM | POA: Insufficient documentation

## 2023-02-25 NOTE — Progress Notes (Signed)
Kindly inform the patient that transcranial Doppler bubble study shows no evidence of right-to-left shunt or hole in the heart.

## 2023-02-28 ENCOUNTER — Encounter: Payer: Self-pay | Admitting: Family

## 2023-02-28 ENCOUNTER — Telehealth: Payer: Self-pay

## 2023-02-28 NOTE — Telephone Encounter (Signed)
Pt returned call from nurse. Requesting a call back. 

## 2023-02-28 NOTE — Telephone Encounter (Signed)
yes, thx

## 2023-02-28 NOTE — Telephone Encounter (Signed)
Left msg to call back regarding result

## 2023-02-28 NOTE — Telephone Encounter (Signed)
-----   Message from Garvin Fila, MD sent at 02/25/2023  8:30 AM EDT ----- Austin [name] inform the patient that transcranial Doppler bubble study shows no evidence of right-to-left shunt or hole in the heart.

## 2023-02-28 NOTE — Telephone Encounter (Signed)
was he fasting for the lab work? if not, he should fast for 8h and schedule an office visit with me. Either way, he needs an office visit, thx.

## 2023-03-01 ENCOUNTER — Encounter: Payer: Self-pay | Admitting: Family

## 2023-03-05 ENCOUNTER — Ambulatory Visit
Admission: RE | Admit: 2023-03-05 | Discharge: 2023-03-05 | Disposition: A | Discharge status: Home / Self Care | Payer: Federal, State, Local not specified - PPO | Source: Ambulatory Visit | Attending: Neurology | Admitting: Neurology

## 2023-03-05 DIAGNOSIS — I639 Cerebral infarction, unspecified: Secondary | ICD-10-CM | POA: Diagnosis not present

## 2023-03-05 MED ORDER — GADOPICLENOL 0.5 MMOL/ML IV SOLN
10.0000 mL | Freq: Once | INTRAVENOUS | Status: AC | PRN
  Administered 2023-03-05: 10 mL via INTRAVENOUS

## 2023-03-07 NOTE — Progress Notes (Signed)
Kindly inform the patient that MRI scan of the brain shows expected changes in the recent stroke on the left side towards the back of the brain in December 2023.  No new or unexpected findings MR angiogram study of the brain blood vessels shows blockage of the blood vessel supplying the area affected by the stroke in December 2023

## 2023-03-08 ENCOUNTER — Encounter: Payer: Self-pay | Admitting: Family

## 2023-03-08 ENCOUNTER — Ambulatory Visit: Payer: Federal, State, Local not specified - PPO | Admitting: Family

## 2023-03-08 ENCOUNTER — Other Ambulatory Visit: Payer: Self-pay | Admitting: Family

## 2023-03-08 ENCOUNTER — Other Ambulatory Visit: Payer: Self-pay | Admitting: Neurology

## 2023-03-08 VITALS — BP 120/81 | HR 85 | Temp 97.5°F | Ht 77.0 in | Wt 265.0 lb

## 2023-03-08 DIAGNOSIS — I440 Atrioventricular block, first degree: Secondary | ICD-10-CM

## 2023-03-08 DIAGNOSIS — I4891 Unspecified atrial fibrillation: Secondary | ICD-10-CM

## 2023-03-08 DIAGNOSIS — Z8673 Personal history of transient ischemic attack (TIA), and cerebral infarction without residual deficits: Secondary | ICD-10-CM

## 2023-03-08 DIAGNOSIS — E785 Hyperlipidemia, unspecified: Secondary | ICD-10-CM

## 2023-03-08 DIAGNOSIS — R42 Dizziness and giddiness: Secondary | ICD-10-CM

## 2023-03-08 DIAGNOSIS — I639 Cerebral infarction, unspecified: Secondary | ICD-10-CM

## 2023-03-08 DIAGNOSIS — M328 Other forms of systemic lupus erythematosus: Secondary | ICD-10-CM

## 2023-03-08 LAB — COMPREHENSIVE METABOLIC PANEL
ALT: 18 U/L (ref 0–53)
AST: 16 U/L (ref 0–37)
Albumin: 3.6 g/dL (ref 3.5–5.2)
Alkaline Phosphatase: 52 U/L (ref 39–117)
BUN: 12 mg/dL (ref 6–23)
CO2: 24 mEq/L (ref 19–32)
Calcium: 8.8 mg/dL (ref 8.4–10.5)
Chloride: 108 mEq/L (ref 96–112)
Creatinine, Ser: 0.91 mg/dL (ref 0.40–1.50)
GFR: 99.19 mL/min (ref >60.00)
Glucose, Bld: 95 mg/dL (ref 70–99)
Potassium: 3.8 mEq/L (ref 3.5–5.1)
Sodium: 136 mEq/L (ref 135–145)
Total Bilirubin: 0.8 mg/dL (ref 0.2–1.2)
Total Protein: 8.5 g/dL — ABNORMAL HIGH (ref 6.0–8.3)

## 2023-03-08 LAB — LIPID PANEL
Cholesterol: 133 mg/dL (ref 0–200)
HDL: 34.5 mg/dL — ABNORMAL LOW (ref >39.00)
LDL Cholesterol: 70 mg/dL (ref 0–99)
NonHDL: 98.45
Total CHOL/HDL Ratio: 4
Triglycerides: 141 mg/dL (ref 0.0–149.0)
VLDL: 28.2 mg/dL (ref 0.0–40.0)

## 2023-03-08 MED ORDER — ATORVASTATIN CALCIUM 20 MG PO TABS: 80.0000 mg | ORAL_TABLET | Freq: Every day | ORAL | 1 refills | Status: DC

## 2023-03-08 NOTE — Telephone Encounter (Signed)
The location needs to say [facility]

## 2023-03-08 NOTE — Assessment & Plan Note (Addendum)
doing better, seen by Neuro & Plavix d/c continue ASA 81mg  pt did not ask Leonie Man about Lipitor refill, has been taking 1/2 pill qod = 20mg  qd - per Neuro, LDL goal <70 rechecking fasting lipids today & CMP, last month lipids ok, but triglycerides high, not sure if pt was fasting pt requesting note to go back to work Thursday f/u prn

## 2023-03-08 NOTE — Telephone Encounter (Signed)
Orders sent

## 2023-03-08 NOTE — Progress Notes (Signed)
MyChart lab result

## 2023-03-08 NOTE — Assessment & Plan Note (Addendum)
chronic has re-est w/Rheum, restarted on Plaquenil, tolerating so far returning to work this week after CVA several months ago seen by Cardiology and all checked out good f/u prn

## 2023-03-08 NOTE — Progress Notes (Signed)
Patient ID: Austin [name], male    DOB: 1976-02-04, 47 y.o.   MRN: [id number]  Chief Complaint  Patient presents with   Hyperlipidemia    Fasting W/ labs    HPI: Recent CVA dx:  CVA via [facility], hospital 12/29-12/30, seen in UC d/t ongoing dizziness & tinnitus on 1/5. 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 Has f/u with neurology in Feb. Also has Lupus. Reports having some tinnitus prior to CVA, going back to childhood, seen by specialists but he does not remember if a cause was ever found. Pt also d/c with hypokalemia. Had been on Lipitor 80mg  qd but started to run out, and split pills in half for his level check last month, this month he has been taking every other month.   Lupus:  HX:  dx 2014 - Renal bx 2014: consistent with class 2 lupus glomeronephritis, mesangial lupus nephritis, reports not tolerating Plaquenil in past and then stopped following with Rheumatology - states provider was located on [address] and he thinks doctor name was Ouida Sills - see reference in previous PCP notes, but no notes. Pt has been seen by Cardiology and all checked out fine. Also seen by Rheum and restarted on Plaquenil, but tolerating so far.  Assessment & Plan:  Status post CVA Assessment & Plan: doing better, seen by Neuro & Plavix d/c continue ASA 81mg  pt did not ask Leonie Man about Lipitor refill, has been taking 1/2 pill qod = 20mg  qd - per Neuro, LDL goal <70 rechecking fasting lipids today & CMP, last month lipids ok, but triglycerides high, not sure if pt was fasting pt requesting note to go back to work Thursday f/u prn  Orders: -     Lipid panel -     Comprehensive metabolic panel  Other forms of systemic lupus erythematosus, unspecified organ involvement status Assessment & Plan: chronic has re-est w/Rheum, restarted on Plaquenil, tolerating so far returning to work this week after CVA several months ago seen by Cardiology and all checked out good f/u  prn    Subjective:    Outpatient Medications Prior to Visit  Medication Sig Dispense Refill   aspirin EC 81 MG tablet Take 1 tablet (81 mg total) by mouth daily. Swallow whole. 30 tablet 11   atorvastatin (LIPITOR) 80 MG tablet Take 1 tablet (80 mg total) by mouth daily. 30 tablet 1   Cyanocobalamin (VITAMIN B-12 PO) Take 1 tablet by mouth in the morning and at bedtime.     Fe Bisgly-Vit C-Vit B12-FA (GENTLE IRON PO) Take 1 tablet by mouth in the morning and at bedtime.     hydroxychloroquine (PLAQUENIL) 200 MG tablet Take 200 mg by mouth daily in the afternoon.     POTASSIUM PO Take 1 tablet by mouth in the morning.     No facility-administered medications prior to visit.   Past Medical History:  Diagnosis Date   Acute CVA (cerebrovascular accident) 12/03/2022   Acute renal failure 09/30/2013   HIP PAIN, LEFT, CHRONIC 02/18/2009   Qualifier: Diagnosis of   By: Elease Hashimoto MD, Bruce       HYPERLIPIDEMIA 02/18/2009   HYPERTENSION 02/18/2009   Hypokalemia 12/03/2022   Hyponatremia 10/02/2013   Joint pain 09/28/2013   Lupus glomerulonephritis    Nephritis    Other pancytopenia 09/29/2013   Pancytopenia    Past Surgical History:  Procedure Laterality Date   BUBBLE STUDY  01/17/2023   Procedure: BUBBLE STUDY;  Surgeon:  Janina Mayo, MD;  Location: [facility] ENDOSCOPY;  Service: Cardiovascular;;   RENAL BIOPSY  12/06/2001   TEE WITHOUT CARDIOVERSION N/A 01/17/2023   Procedure: TRANSESOPHAGEAL ECHOCARDIOGRAM (TEE);  Surgeon: Janina Mayo, MD;  Location: [facility] ENDOSCOPY;  Service: Cardiovascular;  Laterality: N/A;   TONSILLECTOMY     WISDOM TOOTH EXTRACTION     No Known Allergies    Objective:    Physical Exam Vitals and nursing note reviewed.  Constitutional:      General: He is not in acute distress.    Appearance: Normal appearance.  HENT:     Head: Normocephalic.  Cardiovascular:     Rate and Rhythm: Normal rate and regular rhythm.  Pulmonary:     Effort: Pulmonary effort  is normal.     Breath sounds: Normal breath sounds.  Musculoskeletal:        General: Normal range of motion.     Cervical back: Normal range of motion.  Skin:    General: Skin is warm and dry.  Neurological:     Mental Status: He is alert and oriented to person, place, and time.  Psychiatric:        Mood and Affect: Mood normal.    BP 120/81 (BP Location: Left Arm, Patient Position: Sitting, Cuff Size: Large)   Pulse 85   Temp (!) 97.5 F (36.4 C) (Temporal)   Ht 6\' 5"  (1.956 m)   Wt 265 lb (120.2 kg)   SpO2 95%   BMI 31.42 kg/m  Wt Readings from Last 3 Encounters:  03/08/23 265 lb (120.2 kg)  02/08/23 259 lb (117.5 kg)  01/17/23 250 lb (113.4 kg)      Jeanie Sewer, NP

## 2023-03-08 NOTE — Patient Instructions (Signed)
It was very nice to see you today!   I will review your lab results via MyChart in a few days.   Glad you are doing better!      PLEASE NOTE:  If you had any lab tests please let us know if you have not heard back within a few days. You may see your results on MyChart before we have a chance to review them but we will give you a call once they are reviewed by [place]. If we ordered any referrals today, please let us know if you have not heard from their office within the next week.

## 2023-03-08 NOTE — Telephone Encounter (Signed)
Orders corrected. 

## 2023-03-08 NOTE — Addendum Note (Signed)
Addended by: Andre Lefort on: 03/08/2023 04:46 PM   Modules accepted: Orders

## 2023-03-16 ENCOUNTER — Ambulatory Visit: Payer: Federal, State, Local not specified - PPO | Admitting: Internal Medicine

## 2023-03-16 ENCOUNTER — Encounter: Payer: Self-pay | Admitting: Internal Medicine

## 2023-03-16 VITALS — BP 122/80 | HR 82 | Ht 77.0 in | Wt 265.0 lb

## 2023-03-16 DIAGNOSIS — K5900 Constipation, unspecified: Secondary | ICD-10-CM

## 2023-03-16 DIAGNOSIS — Z1211 Encounter for screening for malignant neoplasm of colon: Secondary | ICD-10-CM

## 2023-03-16 MED ORDER — NA SULFATE-K SULFATE-MG SULF 17.5-3.13-1.6 GM/177ML PO SOLN: ORAL | 0 refills | Status: DC

## 2023-03-16 NOTE — Progress Notes (Signed)
Chief Complaint: Colon cancer screening  HPI : 47 year old male with history of CVA, SLE on Plaquenil, and HTN presents to discuss colon cancer screening  Denies blood in the stools or weight loss. Denies family history of colon cancer. He last had a mild stroke on 11/2022, and ever since then, he has one BM every 2 days. He has been off of Plavix for the last month. Denies prior colonoscopy. Denies nausea, vomiting, dysphagia, chest burning, regurgitation, and ab pain currently. Did have some nausea after his stroke, but this has since resolved.  Wt Readings from Last 3 Encounters:  03/16/23 265 lb (120.2 kg)  03/08/23 265 lb (120.2 kg)  02/08/23 259 lb (117.5 kg)   Past Medical History:  Diagnosis Date   Acute CVA (cerebrovascular accident) 12/03/2022   Acute renal failure 09/30/2013   Anemia    Arthritis    HIP PAIN, LEFT, CHRONIC 02/18/2009   Qualifier: Diagnosis of   By: Caryl Never MD, Bruce       HYPERLIPIDEMIA 02/18/2009   HYPERTENSION 02/18/2009   Hypokalemia 12/03/2022   Hyponatremia 10/02/2013   Joint pain 09/28/2013   Lupus glomerulonephritis    Nephritis    Other pancytopenia 09/29/2013   Pancytopenia    Stroke      Past Surgical History:  Procedure Laterality Date   BUBBLE STUDY  01/17/2023   Procedure: BUBBLE STUDY;  Surgeon: Maisie Fus, MD;  Location: [facility] ENDOSCOPY;  Service: Cardiovascular;;   RENAL BIOPSY  12/06/2001   TEE WITHOUT CARDIOVERSION N/A 01/17/2023   Procedure: TRANSESOPHAGEAL ECHOCARDIOGRAM (TEE);  Surgeon: Maisie Fus, MD;  Location: [facility] ENDOSCOPY;  Service: Cardiovascular;  Laterality: N/A;   TONSILLECTOMY     WISDOM TOOTH EXTRACTION     Family History  Problem Relation Age of Onset   Hyperlipidemia Father    Hypertension Maternal Grandmother    Diabetes Maternal Grandmother    Hypertension Maternal Grandfather    Cancer Maternal Grandfather        prostate   Colon cancer Neg Hx    Stomach cancer Neg Hx    Esophageal cancer Neg  Hx    Social History   Tobacco Use   Smoking status: Never   Smokeless tobacco: Never  Vaping Use   Vaping Use: Never used  Substance Use Topics   Alcohol use: Not Currently   Drug use: No   Current Outpatient Medications  Medication Sig Dispense Refill   aspirin EC 81 MG tablet Take 1 tablet (81 mg total) by mouth daily. Swallow whole. 30 tablet 11   atorvastatin (LIPITOR) 20 MG tablet Take 4 tablets (80 mg total) by mouth daily. 90 tablet 1   Cyanocobalamin (VITAMIN B-12 PO) Take 1 tablet by mouth in the morning and at bedtime.     Fe Bisgly-Vit C-Vit B12-FA (GENTLE IRON PO) Take 1 tablet by mouth in the morning and at bedtime.     hydroxychloroquine (PLAQUENIL) 200 MG tablet Take 200 mg by mouth daily in the afternoon.     POTASSIUM PO Take 1 tablet by mouth in the morning.     No current facility-administered medications for this visit.   No Known Allergies   Review of Systems: All systems reviewed and negative except where noted in HPI.   Physical Exam: BP 122/80   Pulse 82   Ht 6\' 5"  (1.956 m)   Wt 265 lb (120.2 kg)   BMI 31.42 kg/m  Constitutional: Pleasant,well-developed, male in no acute distress. HEENT: Normocephalic  and atraumatic. Conjunctivae are normal. No scleral icterus. Cardiovascular: Normal rate, regular rhythm.  Pulmonary/chest: Effort normal and breath sounds normal. No wheezing, rales or rhonchi. Abdominal: Soft, nondistended, nontender. Bowel sounds active throughout. There are no masses palpable. No hepatomegaly. Extremities: No edema Neurological: Alert and oriented to person place and time. Skin: Skin is warm and dry. No rashes noted. Psychiatric: Normal mood and affect. Behavior is normal.  Labs 01/2023: CBC with mildly low Hb of 12.8  Labs 03/2023: CMP unremarkable  ASSESSMENT AND PLAN: Colon cancer screening Constipation Patient presents to discuss a colonoscopy for colon cancer screening. He last had a CVA in 11/2022 and was on Plavix  therapy for 3 months afterwards. He has now been off of Plavix for 1 month. I went over the colonoscopy procedure with the patient, and he agreeable to proceeding. He does have some mild constipation at a baseline so will have him start Miralax QD a week before the procedure to try to ensure an adequate prep. Patient was noted to have very mild anemia on his recent labs, but I would recommend continuing to monitor this. If anemia worsens in the future, then could consider further work up to look for IDA - Colonoscopy [facility]. Will start Miralax QD 7 days beforehand.  Eulah Pont, MD  I spent 45 minutes of time, including in depth chart review, independent review of results as outlined above, communicating results with the patient directly, face-to-face time with the patient, coordinating care, ordering studies and medications as appropriate, and documentation.

## 2023-03-16 NOTE — Patient Instructions (Addendum)
You have been scheduled for a colonoscopy. Please follow written instructions given to you at your visit today.  Please pick up your prep supplies at the pharmacy within the next 1-3 days. If you use inhalers (even only as needed), please bring them with you on the day of your procedure.    We have sent the following medications to your pharmacy for you to pick up at your convenience: Suprep  Start taking Miralax daily 7 days before procedure  _______________________________________________________  If your blood pressure at your visit was 140/90 or greater, please contact your primary care physician to follow up on this.  _______________________________________________________  If you are age 52 or older, your body mass index should be between 23-30. Your Body mass index is 31.42 kg/m. If this is out of the aforementioned range listed, please consider follow up with your Primary Care Provider.  If you are age 31 or younger, your body mass index should be between 19-25. Your Body mass index is 31.42 kg/m. If this is out of the aformentioned range listed, please consider follow up with your Primary Care Provider.   ________________________________________________________  The [place] [facility] providers would like to encourage you to use [facility] to communicate with providers for non-urgent requests or questions.  Due to long hold times on the telephone, sending your provider a message by [facility] may be a faster and more efficient way to get a response.  Please allow 48 business hours for a response.  Please remember that this is for non-urgent requests.  _______________________________________________________   Due to recent changes in healthcare laws, you may see the results of your imaging and laboratory studies on MyChart before your provider has had a chance to review them.  We understand that in some cases there may be results that are confusing or concerning to you. Not all laboratory results  come back in the same time frame and the provider may be waiting for multiple results in order to interpret others.  Please give [place] 48 hours in order for your provider to thoroughly review all the results before contacting the office for clarification of your results.    Thank you for entrusting me with your care and for choosing [facility], Dr. Eulah Pont

## 2023-03-24 ENCOUNTER — Ambulatory Visit: Payer: Federal, State, Local not specified - PPO | Admitting: Neurology

## 2023-04-17 ENCOUNTER — Other Ambulatory Visit: Payer: Self-pay | Admitting: Family

## 2023-04-17 DIAGNOSIS — E785 Hyperlipidemia, unspecified: Secondary | ICD-10-CM

## 2023-04-17 DIAGNOSIS — Z8673 Personal history of transient ischemic attack (TIA), and cerebral infarction without residual deficits: Secondary | ICD-10-CM

## 2023-04-18 ENCOUNTER — Encounter: Payer: Self-pay | Admitting: Internal Medicine

## 2023-04-25 ENCOUNTER — Telehealth: Payer: Self-pay | Admitting: Internal Medicine

## 2023-04-25 NOTE — Telephone Encounter (Signed)
Returned pt's call. States he is not feeling well today. Feels like he has a fever. His wife and 2 daughters have had the norovirus the past couple of days. He does not feel like he can tolerate the prep. RN agreed that it sounds like it would be best to reschedule the procedure until he is feeling better. Rescheduled for Tuesday 05/17/23 at 2p. New instructions sent to pt via MyChart, he verbalized that he uses this. Pt already had prep at home.

## 2023-04-25 NOTE — Telephone Encounter (Signed)
Inbound call from patient , he is schedule for a procedure tomorrow but want to know if its better for him to reschedule. Patient  stated his daughters are sick and he is been taking medication and dont know if that will affect his procedure .Please advise.

## 2023-04-26 ENCOUNTER — Encounter: Payer: Federal, State, Local not specified - PPO | Admitting: Internal Medicine

## 2023-05-17 ENCOUNTER — Ambulatory Visit (AMBULATORY_SURGERY_CENTER): Payer: Federal, State, Local not specified - PPO | Admitting: Internal Medicine

## 2023-05-17 ENCOUNTER — Encounter: Payer: Federal, State, Local not specified - PPO | Admitting: Internal Medicine

## 2023-05-17 ENCOUNTER — Encounter: Payer: Self-pay | Admitting: Internal Medicine

## 2023-05-17 VITALS — BP 109/66 | HR 70 | Temp 98.1°F | Resp 11 | Ht 77.0 in | Wt 265.0 lb

## 2023-05-17 DIAGNOSIS — D123 Benign neoplasm of transverse colon: Secondary | ICD-10-CM | POA: Diagnosis not present

## 2023-05-17 DIAGNOSIS — D120 Benign neoplasm of cecum: Secondary | ICD-10-CM

## 2023-05-17 DIAGNOSIS — D122 Benign neoplasm of ascending colon: Secondary | ICD-10-CM | POA: Diagnosis not present

## 2023-05-17 DIAGNOSIS — Z1211 Encounter for screening for malignant neoplasm of colon: Secondary | ICD-10-CM

## 2023-05-17 DIAGNOSIS — K635 Polyp of colon: Secondary | ICD-10-CM | POA: Diagnosis not present

## 2023-05-17 MED ORDER — SODIUM CHLORIDE 0.9 % IV SOLN
500.0000 mL | Freq: Once | INTRAVENOUS | Status: DC
Start: 2023-05-17 — End: 2023-05-17

## 2023-05-17 NOTE — Op Note (Signed)
[facility] Patient Name: Austin [name] Procedure Date: 05/17/2023 2:21 PM MRN: [id number] Endoscopist: Madelyn Brunner Harvey , , [id number] Age: 47 Referring MD:  Date of Birth: 1976-03-12 Gender: Male Account #: [id number] Procedure:                Colonoscopy Indications:              Screening for colorectal malignant neoplasm, This                            is the patient's first colonoscopy Medicines:                Monitored Anesthesia Care Procedure:                Pre-Anesthesia Assessment:                           - Prior to the procedure, a History and Physical                            was performed, and patient medications and                            allergies were reviewed. The patient's tolerance of                            previous anesthesia was also reviewed. The risks                            and benefits of the procedure and the sedation                            options and risks were discussed with the patient.                            All questions were answered, and informed consent                            was obtained. Prior Anticoagulants: The patient has                            taken no anticoagulant or antiplatelet agents. ASA                            Grade Assessment: II - A patient with mild systemic                            disease. After reviewing the risks and benefits,                            the patient was deemed in satisfactory condition to                            undergo the procedure.  After obtaining informed consent, the colonoscope                            was passed under direct vision. Throughout the                            procedure, the patient's blood pressure, pulse, and                            oxygen saturations were monitored continuously. The                            Olympus CF-HQ190L [id number]) Colonoscope was                            introduced through the  anus and advanced to the the                            terminal ileum. The colonoscopy was performed                            without difficulty. The patient tolerated the                            procedure well. The quality of the bowel                            preparation was excellent. The terminal ileum,                            ileocecal valve, appendiceal orifice, and rectum                            were photographed. Scope In: 2:29:43 PM Scope Out: 2:50:51 PM Scope Withdrawal Time: 0 hours 17 minutes 12 seconds  Total Procedure Duration: 0 hours 21 minutes 8 seconds  Findings:                 The terminal ileum appeared normal.                           Four sessile polyps were found in the transverse                            colon, ascending colon and cecum. The polyps were 3                            to 6 mm in size. These polyps were removed with a                            cold snare. Resection and retrieval were complete.                           Non-bleeding internal hemorrhoids were found during  retroflexion. Complications:            No immediate complications. Estimated Blood Loss:     Estimated blood loss was minimal. Impression:               - The examined portion of the ileum was normal.                           - Four 3 to 6 mm polyps in the transverse colon, in                            the ascending colon and in the cecum, removed with                            a cold snare. Resected and retrieved.                           - Non-bleeding internal hemorrhoids. Recommendation:           - Discharge patient to home (with escort).                           - Await pathology results.                           - The findings and recommendations were discussed                            with the patient. Dr Particia Lather "Alan Ripper" Leonides Schanz,  05/17/2023 2:54:04 PM

## 2023-05-17 NOTE — Progress Notes (Signed)
A and O x3. Report to RN. Tolerated MAC anesthesia well. 

## 2023-05-17 NOTE — Patient Instructions (Signed)
Resume previous diet Continue present medications Await pathology results Handouts/information given for polyps and hemorrhoids  YOU HAD AN ENDOSCOPIC PROCEDURE TODAY AT THE [facility]:   Refer to the procedure report that was given to you for any specific questions about what was found during the examination.  If the procedure report does not answer your questions, please call your gastroenterologist to clarify.  If you requested that your care partner not be given the details of your procedure findings, then the procedure report has been included in a sealed envelope for you to review at your convenience later.  YOU SHOULD EXPECT: Some feelings of bloating in the abdomen. Passage of more gas than usual.  Walking can help get rid of the air that was put into your GI tract during the procedure and reduce the bloating. If you had a lower endoscopy (such as a colonoscopy or flexible sigmoidoscopy) you may notice spotting of blood in your stool or on the toilet paper. If you underwent a bowel prep for your procedure, you may not have a normal bowel movement for a few days.  Please Note:  You might notice some irritation and congestion in your nose or some drainage.  This is from the oxygen used during your procedure.  There is no need for concern and it should clear up in a day or so.  SYMPTOMS TO REPORT IMMEDIATELY:  Following lower endoscopy (colonoscopy):  Excessive amounts of blood in the stool  Significant tenderness or worsening of abdominal pains  Swelling of the abdomen that is new, acute  Fever of 100F or higher  For urgent or emergent issues, a gastroenterologist can be reached at any hour by calling [phone]. Do not use MyChart messaging for urgent concerns.   DIET:  We do recommend a small meal at first, but then you may proceed to your regular diet.  Drink plenty of fluids but you should avoid alcoholic beverages for 24 hours.  ACTIVITY:  You should plan to take  it easy for the rest of today and you should NOT DRIVE or use heavy machinery until tomorrow (because of the sedation medicines used during the test).    FOLLOW UP: Our staff will call the number listed on your records the next business day following your procedure.  We will call around 7:15- 8:00 am to check on you and address any questions or concerns that you may have regarding the information given to you following your procedure. If we do not reach you, we will leave a message.     If any biopsies were taken you will be contacted by phone or by letter within the next 1-3 weeks.  Please call us at [phone] if you have not heard about the biopsies in 3 weeks.    SIGNATURES/CONFIDENTIALITY: You and/or your care partner have signed paperwork which will be entered into your electronic medical record.  These signatures attest to the fact that that the information above on your After Visit Summary has been reviewed and is understood.  Full responsibility of the confidentiality of this discharge information lies with you and/or your care-partner. 

## 2023-05-17 NOTE — Progress Notes (Signed)
Called to room to assist during endoscopic procedure.  Patient ID and intended procedure confirmed with present staff. Received instructions for my participation in the procedure from the performing physician.  

## 2023-05-17 NOTE — Progress Notes (Signed)
GASTROENTEROLOGY PROCEDURE H&P NOTE   Primary Care Physician: Dulce Sellar, NP    Reason for Procedure:   Colon cancer screening  Plan:    Colonoscopy  Patient is appropriate for endoscopic procedure(s) in the ambulatory ([facility]) setting.  The nature of the procedure, as well as the risks, benefits, and alternatives were carefully and thoroughly reviewed with the patient. Ample time for discussion and questions allowed. The patient understood, was satisfied, and agreed to proceed.     HPI: Austin [name] is a 47 y.o. male who presents for colonoscopy for evaluation of colon cancer screening .  Patient was most recently seen in the Gastroenterology Clinic on 03/16/23.  No interval change in medical history since that appointment. Please refer to that note for full details regarding GI history and clinical presentation.   Past Medical History:  Diagnosis Date   Acute CVA (cerebrovascular accident) (HCC) 12/03/2022   Acute renal failure (HCC) 09/30/2013   Anemia    Arthritis    HIP PAIN, LEFT, CHRONIC 02/18/2009   Qualifier: Diagnosis of   By: Caryl Never MD, Bruce       HYPERLIPIDEMIA 02/18/2009   HYPERTENSION 02/18/2009   Hypokalemia 12/03/2022   Hyponatremia 10/02/2013   Joint pain 09/28/2013   Lupus glomerulonephritis (HCC)    Nephritis    Other pancytopenia (HCC) 09/29/2013   Pancytopenia (HCC)    Stroke [facility])     Past Surgical History:  Procedure Laterality Date   BUBBLE STUDY  01/17/2023   Procedure: BUBBLE STUDY;  Surgeon: Maisie Fus, MD;  Location: [facility] ENDOSCOPY;  Service: Cardiovascular;;   RENAL BIOPSY  12/06/2001   TEE WITHOUT CARDIOVERSION N/A 01/17/2023   Procedure: TRANSESOPHAGEAL ECHOCARDIOGRAM (TEE);  Surgeon: Maisie Fus, MD;  Location: [facility] ENDOSCOPY;  Service: Cardiovascular;  Laterality: N/A;   TONSILLECTOMY     WISDOM TOOTH EXTRACTION      Prior to Admission medications   Medication Sig Start Date End Date Taking? Authorizing Provider   aspirin EC 81 MG tablet Take 1 tablet (81 mg total) by mouth daily. Swallow whole. 12/05/22  Yes Azucena Fallen, MD  atorvastatin (LIPITOR) 20 MG tablet TAKE 4 TABLETS BY MOUTH DAILY. 04/18/23  Yes Dulce Sellar, NP  Cyanocobalamin (VITAMIN B-12 PO) Take 1 tablet by mouth in the morning and at bedtime.   Yes [provider]  Fe Bisgly-Vit C-Vit B12-FA (GENTLE IRON PO) Take 1 tablet by mouth in the morning and at bedtime.   Yes [provider]  POTASSIUM PO Take 1 tablet by mouth in the morning.   Yes [provider]  hydroxychloroquine (PLAQUENIL) 200 MG tablet Take 200 mg by mouth daily in the afternoon. 12/23/22   [provider]    Current Outpatient Medications  Medication Sig Dispense Refill   aspirin EC 81 MG tablet Take 1 tablet (81 mg total) by mouth daily. Swallow whole. 30 tablet 11   atorvastatin (LIPITOR) 20 MG tablet TAKE 4 TABLETS BY MOUTH DAILY. 90 tablet 1   Cyanocobalamin (VITAMIN B-12 PO) Take 1 tablet by mouth in the morning and at bedtime.     Fe Bisgly-Vit C-Vit B12-FA (GENTLE IRON PO) Take 1 tablet by mouth in the morning and at bedtime.     POTASSIUM PO Take 1 tablet by mouth in the morning.     hydroxychloroquine (PLAQUENIL) 200 MG tablet Take 200 mg by mouth daily in the afternoon.     Current Facility-Administered Medications  Medication Dose Route Frequency Provider Last  Rate Last Admin   0.9 %  sodium chloride infusion  500 mL Intravenous Once Imogene Burn, MD        Allergies as of 05/17/2023   (No Known Allergies)    Family History  Problem Relation Age of Onset   Hyperlipidemia Father    Hypertension Maternal Grandmother    Diabetes Maternal Grandmother    Hypertension Maternal Grandfather    Cancer Maternal Grandfather        prostate   Colon cancer Neg Hx    Stomach cancer Neg Hx    Esophageal cancer Neg Hx    Rectal cancer Neg Hx     Social History   Socioeconomic History   Marital status:  Married    Spouse name: Not on file   Number of children: 4   Years of education: Not on file   Highest education level: Not on file  Occupational History   Occupation: post office  Tobacco Use   Smoking status: Never   Smokeless tobacco: Never  Vaping Use   Vaping Use: Never used  Substance and Sexual Activity   Alcohol use: Not Currently   Drug use: No   Sexual activity: Yes  Other Topics Concern   Not on file  Social History Narrative   Not on file   Social Determinants of Health   Financial Resource Strain: Not on file  Food Insecurity: No Food Insecurity (12/03/2022)   Hunger Vital Sign    Worried About Running Out of Food in the Last Year: Never true    Ran Out of Food in the Last Year: Never true  Transportation Needs: No Transportation Needs (12/03/2022)   PRAPARE - Administrator, Civil Service (Medical): No    Lack of Transportation (Non-Medical): No  Physical Activity: Not on file  Stress: Not on file  Social Connections: Not on file  Intimate Partner Violence: Not At Risk (12/03/2022)   Humiliation, Afraid, Rape, and Kick questionnaire    Fear of Current or Ex-Partner: No    Emotionally Abused: No    Physically Abused: No    Sexually Abused: No    Physical Exam: Vital signs in last 24 hours: BP 138/71   Pulse 78   Temp 98.1 F (36.7 C)   Ht 6\' 5"  (1.956 m)   Wt 265 lb (120.2 kg)   SpO2 96%   BMI 31.42 kg/m  GEN: NAD EYE: Sclerae anicteric ENT: MMM CV: Non-tachycardic Pulm: No increased WOB GI: Soft NEURO:  Alert & Oriented   Eulah Pont, MD  Gastroenterology   05/17/2023 1:53 PM

## 2023-05-18 ENCOUNTER — Telehealth: Payer: Self-pay

## 2023-05-18 NOTE — Telephone Encounter (Signed)
No answer.  Left HIPAA compliant voicemail.

## 2023-05-23 ENCOUNTER — Encounter: Payer: Self-pay | Admitting: Internal Medicine

## 2023-06-28 ENCOUNTER — Ambulatory Visit: Payer: Federal, State, Local not specified - PPO | Admitting: Neurology

## 2023-06-28 ENCOUNTER — Encounter: Payer: Self-pay | Admitting: Neurology

## 2023-06-28 VITALS — BP 114/80 | HR 87 | Ht 77.0 in | Wt 245.0 lb

## 2023-06-28 DIAGNOSIS — E782 Mixed hyperlipidemia: Secondary | ICD-10-CM

## 2023-06-28 DIAGNOSIS — Z8673 Personal history of transient ischemic attack (TIA), and cerebral infarction without residual deficits: Secondary | ICD-10-CM

## 2023-06-28 NOTE — Progress Notes (Signed)
[facility] [address] [place]. [place] [address] [phone]       OFFICE FOLLOW-UP VISIT NOTE  Mr. DELSIN COPEN Date of Birth:  1976/09/01 Medical Record Number:  [id number]   Referring MD: Carma Leaven  Reason for Referral: Stroke  HPI: Initial visit 02/08/2023 Mr. Aundria Rud is a pleasant 47 year old African-American male seen today for initial office consultation visit for stroke.  History is obtained from the patient and review of electronic medical records.  I have personally reviewed pertinent available imaging films in PACS.  He has past medical history of hypertension and hyperlipidemia, lupus glomerulonephritis and chronic hip pain.  Patient stated on 12/03/2022 developed sudden onset of dizziness, vertigo, nausea and ataxia along with dry heaves.  His symptoms improved somewhat and he rested.  He also complained of headache in the left temporal.  Symptoms started resolving by the time he came to the hospital.  CT head on admission showed no acute abnormality.  CT angiogram of the head and neck showed possible occlusion of the left PICA but despite repeated attempts study was limited due to poor contrast bolus timing.  MRI scan of the brain subsequently confirmed acute left inferior medial cerebellar infarct with mild edema but no mass effect.  2D echo showed ejection fraction of 60 to 65% with grade 1 diastolic dysfunction.  Lower extremity venous Dopplers were negative for DVT.  Transesophageal echo which was done subsequently as an outpatient on 01/17/2023 was normal without evidence of PFO or clot.  LDL cholesterol was 79 mg percent and hemoglobin A1c 5.3.  Hypercoagulable lab work was all negative except slightly low protein S of 32.  dsDNA was positive with titer of 19 and-8 IgG was elevated at 8.0.  Patient has been subsequently seen by rheumatologist.  Started him on Plaquenil.  Patient states he is doing better and his dizziness is improving though  occasionally he gets transiently dizzy but he can hold himself and the feeling passes.  Patient feels this episode may have been brought on by getting a COVID booster shot but he took the injection only in August 2023 and this episode occurred 4 months later hence I feel this would be unlikely related to a.  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  Denies any prior history of strokes, TIAs, seizures, migraines, DVT or pulmonary embolism.  No family history of strokes at a young age.  Patient has noticed tinnitus in his right ear since his stroke which is mild and nondisabling.  He has been started on aspirin and Plavix for 3 weeks with did well and has stopped Plavix and is currently on aspirin alone and tolerating it well without bruising or bleeding.  He is also on Lipitor 80 mg which is tolerating well without side effects.  His blood pressure is well-controlled and today it is 126/84. Update 06/28/2023 : He returns for follow-up after last visit 4 months ago.  Patient states he is doing well.  He has had no recurrent stroke or TIA symptoms.  He remains on aspirin she is tolerating well without bruising or bleeding.  He is also tolerating Lipitor well without muscle aches and pains.  Last lipid profile on 03/08/2023 showed LDL cholesterol to be optimal at 70 mg percent.  Transcranial Doppler bubble study on 02/22/2023 was negative for right-to-left shunt.  Repeat protein S levels on 02/08/2023 were minimally low at 57 but improved from previous reading of 32 6  months ago.  MRI scan of the brain on 03/04/1933 showed stable appearance of the left PICA infarct.  MR angiogram showed occluded left posterior inferior cerebellar artery.  Patient states he is doing well he has no complaints.  His main full neurological recovery.  He remains on Plaquenil for his lupus and follows up with his rheumatologist who actually reduced the dose of the medication recently. ROS:   14 system review of  systems is positive for dizziness, vertigo, nausea, ataxia, dry heaves all other systems negative  PMH:  Past Medical History:  Diagnosis Date   Acute CVA (cerebrovascular accident) (HCC) 12/03/2022   Acute renal failure (HCC) 09/30/2013   Anemia    Arthritis    HIP PAIN, LEFT, CHRONIC 02/18/2009   Qualifier: Diagnosis of   By: Caryl Never MD, Bruce       HYPERLIPIDEMIA 02/18/2009   HYPERTENSION 02/18/2009   Hypokalemia 12/03/2022   Hyponatremia 10/02/2013   Joint pain 09/28/2013   Lupus glomerulonephritis (HCC)    Nephritis    Other pancytopenia (HCC) 09/29/2013   Pancytopenia (HCC)    Stroke [facility])     Social History:  Social History   Socioeconomic History   Marital status: Married    Spouse name: Not on file   Number of children: 4   Years of education: Not on file   Highest education level: Not on file  Occupational History   Occupation: post office  Tobacco Use   Smoking status: Never   Smokeless tobacco: Never  Vaping Use   Vaping status: Never Used  Substance and Sexual Activity   Alcohol use: Not Currently   Drug use: No   Sexual activity: Yes  Other Topics Concern   Not on file  Social History Narrative   Not on file   Social Determinants of Health   Financial Resource Strain: Not on file  Food Insecurity: No Food Insecurity (12/03/2022)   Hunger Vital Sign    Worried About Running Out of Food in the Last Year: Never true    Ran Out of Food in the Last Year: Never true  Transportation Needs: No Transportation Needs (12/03/2022)   PRAPARE - Administrator, Civil Service (Medical): No    Lack of Transportation (Non-Medical): No  Physical Activity: Not on file  Stress: Not on file  Social Connections: Not on file  Intimate Partner Violence: Not At Risk (12/03/2022)   Humiliation, Afraid, Rape, and Kick questionnaire    Fear of Current or Ex-Partner: No    Emotionally Abused: No    Physically Abused: No    Sexually Abused: No     Medications:   Current Outpatient Medications on File Prior to Visit  Medication Sig Dispense Refill   aspirin EC 81 MG tablet Take 1 tablet (81 mg total) by mouth daily. Swallow whole. 30 tablet 11   atorvastatin (LIPITOR) 20 MG tablet TAKE 4 TABLETS BY MOUTH DAILY. (Patient taking differently: Take 40 mg by mouth daily.) 90 tablet 1   Cyanocobalamin (VITAMIN B-12 PO) Take 1 tablet by mouth in the morning and at bedtime.     Fe Bisgly-Vit C-Vit B12-FA (GENTLE IRON PO) Take 1 tablet by mouth in the morning and at bedtime.     hydroxychloroquine (PLAQUENIL) 200 MG tablet Take 200 mg by mouth daily in the afternoon. (Patient not taking: Reported on 06/28/2023)     POTASSIUM PO Take 1 tablet by mouth in the morning. (Patient not taking: Reported on 06/28/2023)  No current facility-administered medications on file prior to visit.    Allergies:  No Known Allergies  Physical Exam General: well developed, well nourished pleasant middle-aged African-American male seated, in no evident distress Head: head normocephalic and atraumatic.   Neck: supple with no carotid or supraclavicular bruits Cardiovascular: regular rate and rhythm, no murmurs Musculoskeletal: no deformity Skin:  no rash/petichiae Vascular:  Normal pulses all extremities  Neurologic Exam Mental Status: Awake and fully alert. Oriented to place and time. Recent and remote memory intact. Attention span, concentration and fund of knowledge appropriate. Mood and affect appropriate.  Cranial Nerves: Fundoscopic exam not done. Pupils equal, briskly reactive to light. Extraocular movements full without nystagmus. Visual fields full to confrontation. Hearing intact. Facial sensation intact. Face, tongue, palate moves normally and symmetrically.  Motor: Normal bulk and tone. Normal strength in all tested extremity muscles. Sensory.: intact to touch , pinprick , position and vibratory sensation.  Coordination: Rapid alternating  movements normal in all extremities. Finger-to-nose and heel-to-shin performed accurately bilaterally. Gait and Station: Arises from chair without difficulty. Stance is normal. Gait demonstrates normal stride length and balance . Able to heel, toe and tandem walk with slight difficulty.  Reflexes: 1+ and symmetric. Toes downgoing.   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  Vascular risk factors of mild hyperlipidemia and lupus     PLAN:I had a long d/w patient about his recent cryptogenic cerebellar stroke, risk for recurrent stroke/TIAs, personally independently reviewed imaging studies and stroke evaluation results and answered questions.Continue aspirin 81 mg daily  for secondary stroke prevention and maintain strict control of hypertension with blood pressure goal below 130/90, diabetes with hemoglobin A1c goal below 6.5% and lipids with LDL cholesterol goal below 70 mg/dL. I also advised the patient to eat a healthy diet with plenty of whole grains, cereals, fruits and vegetables, exercise regularly and maintain ideal body weight .  Follow-up with his rheumatologist for treatment for his lupus.  Followup in the future with my nurse practitioner  in 6 months or call earlier if necessary.  Greater than 50% time during this 35 minute  with spent in counseling and coordination of care about his cerebellar stroke and discussion about evaluation and treatment and answering  Delia Heady, MD Note: This document was prepared with digital dictation and possible smart phrase technology. Any transcriptional errors that result from this process are unintentional.

## 2023-06-28 NOTE — Patient Instructions (Addendum)
:  I had a long d/w patient about his recent cryptogenic cerebellar stroke, risk for recurrent stroke/TIAs, personally independently reviewed imaging studies and stroke evaluation results and answered questions.Continue aspirin 81 mg daily  for secondary stroke prevention and maintain strict control of hypertension with blood pressure goal below 130/90, diabetes with hemoglobin A1c goal below 6.5% and lipids with LDL cholesterol goal below 70 mg/dL. I also advised the patient to eat a healthy diet with plenty of whole grains, cereals, fruits and vegetables, exercise regularly and maintain ideal body weight .  Follow-up with his rheumatologist for treatment for his lupus.  Followup in the future with my nurse practitioner  in 6 months or call earlier if necessary.

## 2023-07-13 NOTE — Progress Notes (Unsigned)
   I, Stevenson Clinch, CMA acting as a scribe for Clementeen Graham, MD.  Austin [name] is a 47 y.o. male who presents to [facility] today for L calf pain. Pt was previously seen by Dr. Denyse Amass on 11/08/22 for LBP.  Today, pt c/o L calf pain x 3 days. MOI:Sx started after seeing stung by and running from yellow [place] . Pt locates pain to left calf. Unable to take NSAID's. Feeling pain more towards proximal calf. Swelling present more towards the ankle. Some bruising initially but that has improved. Notes some redness in the lower leg.   L LE swelling: yes, improved Aggravates: WB, ambulation Treatments tried: Tylenol, ice  Pertinent review of systems: No fever or chills  Relevant historical information: Lupus.  History of acute kidney injury   Exam:  BP 110/72   Pulse 86   Ht 6\' 5"  (1.956 m)   Wt 243 lb (110.2 kg)   SpO2 97%   BMI 28.82 kg/m  General: Well Developed, well nourished, and in no acute distress.   MSK: Left calf normal-appearing Tender palpation medial gastrocnemius muscle tendinous junction.  Normal foot and ankle motion.  Intact strength.    Lab and Radiology Results  Diagnostic Limited MSK Ultrasound of: Left medial gastrocnemius Hypoechoic fluid and blunting of the muscle tendinous junction medial head gastrocnemius present consistent with calf strain or tear. Impression: Left calf strain or tear medial head gastrocnemius      Assessment and Plan: 47 y.o. male with medial gastrocnemius muscle tear or strain.  Plan for compression sleeve at home exercise program.  Recommend Tylenol Voltaren gel.  We talked about pain medications.  Hold off on this for now. Avoid high-dose oral NSAIDs given history of acute kidney injury and lupus in the past.  PDMP reviewed during this encounter. Orders Placed This Encounter  Procedures   [place] LIMITED JOINT SPACE STRUCTURES LOW LEFT(NO LINKED CHARGES)    Order Specific Question:   Reason  for Exam (SYMPTOM  OR DIAGNOSIS REQUIRED)    Answer:   left calf pain    Order Specific Question:   Preferred imaging location?    Answer:   [facility]   No orders of the defined types were placed in this encounter.    Discussed warning signs or symptoms. Please see discharge instructions. Patient expresses understanding.   The above documentation has been reviewed and is accurate and complete Clementeen Graham, M.D.

## 2023-07-14 ENCOUNTER — Encounter: Payer: Self-pay | Admitting: Family Medicine

## 2023-07-14 ENCOUNTER — Ambulatory Visit: Payer: Federal, State, Local not specified - PPO | Admitting: Family Medicine

## 2023-07-14 ENCOUNTER — Other Ambulatory Visit: Payer: Self-pay

## 2023-07-14 VITALS — BP 110/72 | HR 86 | Ht 77.0 in | Wt 243.0 lb

## 2023-07-14 DIAGNOSIS — M79662 Pain in left lower leg: Secondary | ICD-10-CM | POA: Diagnosis not present

## 2023-07-14 NOTE — Patient Instructions (Addendum)
Thank you for coming in today.   I recommend you obtained a compression sleeve to help with your joint problems. There are many options on the market however I recommend obtaining a full calf Body Helix compression sleeve.  You can find information (including how to appropriate measure yourself for sizing) can be found at www.Body [employer].ch.  Many of these products are health savings account (HSA) eligible.  You can use the compression sleeve at any time throughout the day but is most important to use while being active as well as for 2 hours post-activity.   It is appropriate to ice following activity with the compression sleeve in place.   Please use Voltaren gel (Generic Diclofenac Gel) up to 4x daily for pain as needed.  This is available over-the-counter as both the name brand Voltaren gel and the generic diclofenac gel.   Please complete the exercises that the athletic trainer went over with you:  View at www.my-exercise-code.com using code: 663VFVD

## 2023-07-19 ENCOUNTER — Encounter: Payer: Self-pay | Admitting: Family Medicine

## 2023-07-19 ENCOUNTER — Telehealth: Payer: Self-pay

## 2023-07-19 NOTE — Telephone Encounter (Signed)
Pt will be dropping off or sending FMLA forms to be completed.    Austin [name]  to [facility] (supporting Rodolph Bong, MD)      07/19/23  1:41 PM Fmla paper's arrived on yesterday August 12th and must be returned by the 26th. What day can I drop off paper's.

## 2023-07-21 NOTE — Telephone Encounter (Signed)
Form completed and placed at the front desk for faxing/scanning.

## 2023-07-21 NOTE — Telephone Encounter (Signed)
Pt will also pick up a copy for his records.

## 2023-12-29 ENCOUNTER — Ambulatory Visit: Payer: Federal, State, Local not specified - PPO | Admitting: Adult Health

## 2024-03-06 ENCOUNTER — Ambulatory Visit: Payer: Federal, State, Local not specified - PPO | Admitting: Family Medicine

## 2024-03-06 ENCOUNTER — Encounter: Payer: Self-pay | Admitting: Family Medicine

## 2024-03-06 VITALS — BP 127/79 | HR 79 | Ht 77.0 in | Wt 247.5 lb

## 2024-03-06 DIAGNOSIS — E782 Mixed hyperlipidemia: Secondary | ICD-10-CM | POA: Diagnosis not present

## 2024-03-06 DIAGNOSIS — I639 Cerebral infarction, unspecified: Secondary | ICD-10-CM

## 2024-03-06 DIAGNOSIS — H9313 Tinnitus, bilateral: Secondary | ICD-10-CM

## 2024-03-06 DIAGNOSIS — Z8739 Personal history of other diseases of the musculoskeletal system and connective tissue: Secondary | ICD-10-CM

## 2024-03-06 NOTE — Progress Notes (Signed)
 Chief Complaint  Patient presents with   Follow-up    Pt in room 1. Alone. Here for stroke follow up. Pt reports doing well. No concerns.     HISTORY OF PRESENT ILLNESS:  03/06/24 ALL:  Austin [name] is a 48 y.o. 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 He was last seen by Dr Pearlean Brownie 06/2023 and continued to improve.   He is doing well. No new stroke like symptoms. Tinnitus improved. No sensory changes or weakness. He continues aspirin and atorvastatin. Now taking 10mg  daily. LDL reportedly well controlled. BP remains well controlled. Followed regularly by PCP, cardiology and rheumatology. He is working full time for the [employer]. Very active with his job. No particular exercise routine. He drives without difficulty. Lives with wife and children.    HISTORY (copied from Dr Marlis Edelson previous note)  HPI: Initial visit 02/08/2023 Austin [name] is a pleasant 48 year old African-American male seen today for initial office consultation visit for stroke.  History is obtained from the patient and review of electronic medical records.  I have personally reviewed pertinent available imaging films in PACS.  He has past medical history of hypertension and hyperlipidemia, lupus glomerulonephritis and chronic hip pain.  Patient stated on 12/03/2022 developed sudden onset of dizziness, vertigo, nausea and ataxia along with dry heaves.  His symptoms improved somewhat and he rested.  He also complained of headache in the left temporal.  Symptoms started resolving by the time he came to the hospital.  CT head on admission showed no acute abnormality.  CT angiogram of the head and neck showed possible occlusion of the left PICA but despite repeated attempts study was limited due to poor contrast bolus timing.  MRI scan of the brain subsequently confirmed acute left inferior medial cerebellar infarct with mild edema but no mass effect.  2D echo showed ejection fraction of 60 to  65% with grade 1 diastolic dysfunction.  Lower extremity venous Dopplers were negative for DVT.  Transesophageal echo which was done subsequently as an outpatient on 01/17/2023 was normal without evidence of PFO or clot.  LDL cholesterol was 79 mg percent and hemoglobin A1c 5.3.  Hypercoagulable lab work was all negative except slightly low protein S of 32.  dsDNA was positive with titer of 19 and-8 IgG was elevated at 8.0.  Patient has been subsequently seen by rheumatologist.  Started him on Plaquenil.  Patient states he is doing better and his dizziness is improving though occasionally he gets transiently dizzy but he can hold himself and the feeling passes.  Patient feels this episode may have been brought on by getting a COVID booster shot but he took the injection only in August 2023 and this episode occurred 4 months later hence I feel this would be unlikely related to a.  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  Denies any prior history of strokes, TIAs, seizures, migraines, DVT or pulmonary embolism.  No family history of strokes at a young age.  Patient has noticed tinnitus in his right ear since his stroke which is mild and nondisabling.  He has been started on aspirin and Plavix for 3 weeks with did well and has stopped Plavix and is currently on aspirin alone and tolerating it well without bruising or bleeding.  He is also on Lipitor 80 mg which is tolerating well without side effects.  His blood pressure is well-controlled and today  it is 126/84.  Update 06/28/2023 : He returns for follow-up after last visit 4 months ago.  Patient states he is doing well.  He has had no recurrent stroke or TIA symptoms.  He remains on aspirin she is tolerating well without bruising or bleeding.  He is also tolerating Lipitor well without muscle aches and pains.  Last lipid profile on 03/08/2023 showed LDL cholesterol to be optimal at 70 mg percent.  Transcranial Doppler bubble study on  02/22/2023 was negative for right-to-left shunt.  Repeat protein S levels on 02/08/2023 were minimally low at 57 but improved from previous reading of 32 6 months ago.  MRI scan of the brain on 03/04/1933 showed stable appearance of the left PICA infarct.  MR angiogram showed occluded left posterior inferior cerebellar artery.  Patient states he is doing well he has no complaints.  His main full neurological recovery.  He remains on Plaquenil for his lupus and follows up with his rheumatologist who actually reduced the dose of the medication recently.   REVIEW OF SYSTEMS: Out of a complete 14 system review of symptoms, the patient complains only of the following symptoms, intermittent ringing in ears and all other reviewed systems are negative.   ALLERGIES: No Known Allergies   HOME MEDICATIONS: Outpatient Medications Prior to Visit  Medication Sig Dispense Refill   atorvastatin (LIPITOR) 10 MG tablet Take 10 mg by mouth daily.     hydroxychloroquine (PLAQUENIL) 200 MG tablet Take 200 mg by mouth daily in the afternoon.     POTASSIUM PO Take 1 tablet by mouth in the morning.     aspirin EC 81 MG tablet Take 1 tablet (81 mg total) by mouth daily. Swallow whole. (Patient not taking: Reported on 03/06/2024) 30 tablet 11   atorvastatin (LIPITOR) 20 MG tablet TAKE 4 TABLETS BY MOUTH DAILY. (Patient not taking: Reported on 03/06/2024) 90 tablet 1   Cyanocobalamin (VITAMIN B-12 PO) Take 1 tablet by mouth in the morning and at bedtime. (Patient not taking: Reported on 03/06/2024)     Fe Bisgly-Vit C-Vit B12-FA (GENTLE IRON PO) Take 1 tablet by mouth in the morning and at bedtime. (Patient not taking: Reported on 03/06/2024)     No facility-administered medications prior to visit.     PAST MEDICAL HISTORY: Past Medical History:  Diagnosis Date   Acute CVA (cerebrovascular accident) (HCC) 12/03/2022   Acute renal failure (HCC) 09/30/2013   Anemia    Arthritis    HIP PAIN, LEFT, CHRONIC 02/18/2009    Qualifier: Diagnosis of   By: Caryl Never MD, Bruce       HYPERLIPIDEMIA 02/18/2009   HYPERTENSION 02/18/2009   Hypokalemia 12/03/2022   Hyponatremia 10/02/2013   Joint pain 09/28/2013   Lupus glomerulonephritis (HCC)    Nephritis    Other pancytopenia (HCC) 09/29/2013   Pancytopenia (HCC)    Stroke [facility])      PAST SURGICAL HISTORY: Past Surgical History:  Procedure Laterality Date   BUBBLE STUDY  01/17/2023   Procedure: BUBBLE STUDY;  Surgeon: Maisie Fus, MD;  Location[facility] ENDOSCOPY;  Service: Cardiovascular;;   COLONOSCOPY     RENAL BIOPSY  12/06/2001   TEE WITHOUT CARDIOVERSION N/A 01/17/2023   Procedure: TRANSESOPHAGEAL ECHOCARDIOGRAM (TEE);  Surgeon: Maisie Fus, MD;  Location: [facility] ENDOSCOPY;  Service: Cardiovascular;  Laterality: N/A;   TONSILLECTOMY     WISDOM TOOTH EXTRACTION       FAMILY HISTORY: Family History  Problem Relation Age of Onset   Hyperlipidemia Father  Hypertension Maternal Grandmother    Diabetes Maternal Grandmother    Hypertension Maternal Grandfather    Cancer Maternal Grandfather        prostate   Colon cancer Neg Hx    Stomach cancer Neg Hx    Esophageal cancer Neg Hx    Rectal cancer Neg Hx      SOCIAL HISTORY: Social History   Socioeconomic History   Marital status: Married    Spouse name: Not on file   Number of children: 4   Years of education: Not on file   Highest education level: Not on file  Occupational History   Occupation: post office  Tobacco Use   Smoking status: Never   Smokeless tobacco: Never  Vaping Use   Vaping status: Never Used  Substance and Sexual Activity   Alcohol use: Not Currently   Drug use: No   Sexual activity: Yes  Other Topics Concern   Not on file  Social History Narrative   Not on file   Social Drivers of Health   Financial Resource Strain: Not on file  Food Insecurity: No Food Insecurity (12/03/2022)   Hunger Vital Sign    Worried About Running Out of Food in the Last Year:  Never true    Ran Out of Food in the Last Year: Never true  Transportation Needs: No Transportation Needs (12/03/2022)   PRAPARE - Administrator, Civil Service (Medical): No    Lack of Transportation (Non-Medical): No  Physical Activity: Not on file  Stress: Not on file  Social Connections: Not on file  Intimate Partner Violence: Not At Risk (12/03/2022)   Humiliation, Afraid, Rape, and Kick questionnaire    Fear of Current or Ex-Partner: No    Emotionally Abused: No    Physically Abused: No    Sexually Abused: No     PHYSICAL EXAM  Vitals:   03/06/24 1503  BP: 127/79  Pulse: 79  Weight: 247 lb 8 oz (112.3 kg)  Height: 6\' 5"  (1.956 m)   Body mass index is 29.35 kg/m.  Generalized: Well developed, in no acute distress  Cardiology: normal rate and rhythm, no murmur auscultated  Respiratory: clear to auscultation bilaterally    Neurological examination  Mentation: Alert oriented to time, place, history taking. Follows all commands speech and language fluent Cranial nerve II-XII: Pupils were equal round reactive to light. Extraocular movements were full, visual field were full on confrontational test. Facial sensation and strength were normal. Uvula tongue midline. Head turning and shoulder shrug  were normal and symmetric. Motor: The motor testing reveals 5 over 5 strength of all 4 extremities. Good symmetric motor tone is noted throughout.   Gait and station: Gait is normal.    DIAGNOSTIC DATA (LABS, IMAGING, TESTING) - I reviewed patient records, labs, notes, testing and imaging myself where available.  Lab Results  Component Value Date   WBC 4.0 01/10/2023   HGB 12.8 (L) 01/10/2023   HCT 39.2 01/10/2023   MCV 86 01/10/2023   PLT 154 01/10/2023      Component Value Date/Time   NA 136 03/08/2023 1001   NA 138 01/10/2023 1129   K 3.8 03/08/2023 1001   CL 108 03/08/2023 1001   CO2 24 03/08/2023 1001   GLUCOSE 95 03/08/2023 1001   BUN 12 03/08/2023  1001   BUN 11 01/10/2023 1129   CREATININE 0.91 03/08/2023 1001   CALCIUM 8.8 03/08/2023 1001   PROT 8.5 (H) 03/08/2023 1001  ALBUMIN 3.6 03/08/2023 1001   AST 16 03/08/2023 1001   ALT 18 03/08/2023 1001   ALKPHOS 52 03/08/2023 1001   BILITOT 0.8 03/08/2023 1001   GFRNONAA >60 12/03/2022 0830   GFRAA 61 (L) 10/02/2013 0745   Lab Results  Component Value Date   CHOL 133 03/08/2023   HDL 34.50 (L) 03/08/2023   LDLCALC 70 03/08/2023   TRIG 141.0 03/08/2023   CHOLHDL 4 03/08/2023   Lab Results  Component Value Date   HGBA1C 5.3 12/03/2022   Lab Results  Component Value Date   VITAMINB12 515 09/29/2013   No results found for: "TSH"      No data to display               No data to display           ASSESSMENT AND PLAN  48 y.o. year old male  has a past medical history of Acute CVA (cerebrovascular accident) (HCC) (12/03/2022), Acute renal failure (HCC) (09/30/2013), Anemia, Arthritis, HIP PAIN, LEFT, CHRONIC (02/18/2009), HYPERLIPIDEMIA (02/18/2009), HYPERTENSION (02/18/2009), Hypokalemia (12/03/2022), Hyponatremia (10/02/2013), Joint pain (09/28/2013), Lupus glomerulonephritis (HCC), Nephritis, Other pancytopenia (HCC) (09/29/2013), Pancytopenia (HCC), and Stroke (HCC). here with    Cryptogenic stroke (HCC)  Cerebellar stroke (HCC)  H/O lupus nephritis  Mixed hyperlipidemia  Tinnitus of both ears  Austin [name] is doing well, today. He will continue asa and atorvastatin per PCP direction. Stroke prevention reviewed. Healthy lifestyle habits encouraged. He will follow up with PCP as directed. He will return to see me in 1 year, sooner if needed. He verbalizes understanding and agreement with this plan.   No orders of the defined types were placed in this encounter.    No orders of the defined types were placed in this encounter.   I spent 30 minutes of face-to-face and non-face-to-face time with patient.  This included previsit chart review, lab  review, study review, order entry, electronic health record documentation, patient education.   Shawnie Dapper, MSN, FNP-C 03/06/2024, 3:37 PM  [facility] [address] [place], [place] [address] [phone]

## 2024-03-06 NOTE — Patient Instructions (Signed)
 Below is our plan:  Continue atorvastatin and aspirin for stroke prevention.   Goals:  1) Maintain strict control of hypertension with blood pressure goal below 130/90 2) Maintain good control of diabetes with hemoglobin A1c goal below 7%  3) Maintain good control of lipids with LDL cholesterol goal below 70 mg/dL.  4) Eat a healthy diet with plenty of whole grains, cereals, fruits and vegetables, exercise regularly and maintain ideal body weight   Resources: https://www.williams.biz/  Please make sure you are staying well hydrated. I recommend 50-60 ounces daily. Well balanced diet and regular exercise encouraged. Consistent sleep schedule with 6-8 hours recommended.   Please continue follow up with care team as directed.   Follow up with me in 1 year   You may receive a survey regarding today's visit. I encourage you to leave honest feed back as I do use this information to improve patient care. Thank you for seeing me today!

## 2024-03-26 ENCOUNTER — Ambulatory Visit (HOSPITAL_COMMUNITY)
Admission: RE | Admit: 2024-03-26 | Discharge: 2024-03-26 | Disposition: A | Discharge status: Home / Self Care | Source: Ambulatory Visit | Attending: Family | Admitting: Family

## 2024-03-26 ENCOUNTER — Encounter: Payer: Self-pay | Admitting: Family

## 2024-03-26 ENCOUNTER — Other Ambulatory Visit: Payer: Self-pay | Admitting: Family

## 2024-03-26 ENCOUNTER — Other Ambulatory Visit (HOSPITAL_COMMUNITY): Payer: Self-pay | Admitting: Family

## 2024-03-26 ENCOUNTER — Ambulatory Visit: Admitting: Family

## 2024-03-26 VITALS — BP 151/84 | HR 80 | Temp 98.4°F | Ht 77.0 in | Wt 266.0 lb

## 2024-03-26 DIAGNOSIS — E785 Hyperlipidemia, unspecified: Secondary | ICD-10-CM | POA: Diagnosis not present

## 2024-03-26 DIAGNOSIS — R252 Cramp and spasm: Secondary | ICD-10-CM | POA: Insufficient documentation

## 2024-03-26 DIAGNOSIS — M328 Other forms of systemic lupus erythematosus: Secondary | ICD-10-CM

## 2024-03-26 DIAGNOSIS — M7989 Other specified soft tissue disorders: Secondary | ICD-10-CM | POA: Diagnosis not present

## 2024-03-26 DIAGNOSIS — R609 Edema, unspecified: Secondary | ICD-10-CM

## 2024-03-26 DIAGNOSIS — Z8673 Personal history of transient ischemic attack (TIA), and cerebral infarction without residual deficits: Secondary | ICD-10-CM

## 2024-03-26 DIAGNOSIS — M3214 Glomerular disease in systemic lupus erythematosus: Secondary | ICD-10-CM | POA: Diagnosis not present

## 2024-03-26 DIAGNOSIS — R030 Elevated blood-pressure reading, without diagnosis of hypertension: Secondary | ICD-10-CM

## 2024-03-26 LAB — COMPREHENSIVE METABOLIC PANEL WITH GFR
ALT: 23 U/L (ref 0–53)
AST: 17 U/L (ref 0–37)
Albumin: 2.3 g/dL — ABNORMAL LOW (ref 3.5–5.2)
Alkaline Phosphatase: 41 U/L (ref 39–117)
BUN: 24 mg/dL — ABNORMAL HIGH (ref 6–23)
CO2: 24 meq/L (ref 19–32)
Calcium: 7.6 mg/dL — ABNORMAL LOW (ref 8.4–10.5)
Chloride: 110 meq/L (ref 96–112)
Creatinine, Ser: 1.09 mg/dL (ref 0.40–1.50)
GFR: 79.29 mL/min (ref >60.00)
Glucose, Bld: 112 mg/dL — ABNORMAL HIGH (ref 70–99)
Potassium: 3.7 meq/L (ref 3.5–5.1)
Sodium: 139 meq/L (ref 135–145)
Total Bilirubin: 0.4 mg/dL (ref 0.2–1.2)
Total Protein: 6.4 g/dL (ref 6.0–8.3)

## 2024-03-26 LAB — CBC WITH DIFFERENTIAL/PLATELET
Basophils Absolute: 0.1 10*3/uL (ref 0.0–0.1)
Basophils Relative: 0.8 % (ref 0.0–3.0)
Eosinophils Absolute: 0 10*3/uL (ref 0.0–0.7)
Eosinophils Relative: 0.1 % (ref 0.0–5.0)
HCT: 35.4 % — ABNORMAL LOW (ref 39.0–52.0)
Hemoglobin: 11.6 g/dL — ABNORMAL LOW (ref 13.0–17.0)
Lymphocytes Relative: 14.9 % (ref 12.0–46.0)
Lymphs Abs: 1.9 10*3/uL (ref 0.7–4.0)
MCHC: 32.7 g/dL (ref 30.0–36.0)
MCV: 85.8 fl (ref 78.0–100.0)
Monocytes Absolute: 0.8 10*3/uL (ref 0.1–1.0)
Monocytes Relative: 6 % (ref 3.0–12.0)
Neutro Abs: 10.1 10*3/uL — ABNORMAL HIGH (ref 1.4–7.7)
Neutrophils Relative %: 78.2 % — ABNORMAL HIGH (ref 43.0–77.0)
Platelets: 219 10*3/uL (ref 150.0–400.0)
RBC: 4.13 Mil/uL — ABNORMAL LOW (ref 4.22–5.81)
RDW: 13.7 % (ref 11.5–15.5)
WBC: 13 10*3/uL — ABNORMAL HIGH (ref 4.0–10.5)

## 2024-03-26 LAB — LIPID PANEL
Cholesterol: 193 mg/dL (ref 0–200)
HDL: 62.8 mg/dL (ref >39.00)
LDL Cholesterol: 110 mg/dL — ABNORMAL HIGH (ref 0–99)
NonHDL: 130.49
Total CHOL/HDL Ratio: 3
Triglycerides: 101 mg/dL (ref 0.0–149.0)
VLDL: 20.2 mg/dL (ref 0.0–40.0)

## 2024-03-26 LAB — MICROALBUMIN / CREATININE URINE RATIO
Creatinine,U: 112.7 mg/dL
Microalb Creat Ratio: 2280.7 mg/g — ABNORMAL HIGH (ref 0.0–30.0)
Microalb, Ur: 257.1 mg/dL — ABNORMAL HIGH (ref 0.0–1.9)

## 2024-03-26 MED ORDER — ATORVASTATIN CALCIUM 20 MG PO TABS: 80.0000 mg | ORAL_TABLET | Freq: Every day | ORAL | 3 refills | Status: DC

## 2024-03-26 NOTE — Progress Notes (Unsigned)
 Patient ID: Austin [name], male    DOB: [birth year], 48 y.o.   MRN: [id number]  Chief Complaint  Patient presents with  . Leg Swelling    Pt c/o leg cramping and swelling in bilateral knee pain. Has tried tylenol  which did help pain. Present since last week.   . Hyperlipidemia    Starting retaking 6  months, once daily.   Discussed the use of AI scribe software for clinical note transcription with the patient, who gave verbal consent to proceed.  History of Present Illness The patient, with a history of lupus, nephritis, and a mild stroke, presents with swelling and pain in the knees that started a couple of months ago with cramps. The patient reports that the swelling started in the knees and has been getting "puffier and puffier." The patient also reports increased blood pressure and weight gain of 23 pounds since last year. The patient denies any injury to the knees and attributes the swelling and pain to prolonged sitting during a recent road trip. The patient also reports increased frequency of urination, with the urine being mostly clear. The patient has been taking over-the-counter potassium for cramps and has been on Plaquenil for lupus. The patient also reports taking Atorvastatin  intermittently due to confusion about refills.  Assessment & Plan Bilateral leg swelling/cramping - Edema noted in both legs, right > left with 23-pound weight gain over the last year, with most of that in the last few weeks likely due to lupus nephritis. Increased blood pressure noted. Low potassium possibly related to nephritis. Recent travel for long time in the car may have worsened symptoms. - Order labs for kidney function, liver function, and cholesterol. - Refer to nephrologist for kidney function and fluid retention evaluation. - Advise leg elevation. - Instruct to avoid high-sodium foods and increase water intake. - Provide work note to stay out of work until Thursday, with option to return  earlier if symptoms improve.  Lupus Nephritis - Lupus nephritis may contribute to edema and fluid retention. No nephrologist visit in three years. On Plaquenil thru Rheumatology, reduced due to headaches, and taking OTC potassium supplements. - Check CMP, urine, CBC today - Refer to nephrologist for lupus nephritis evaluation and management. - Continue Plaquenil at current dose. - Will advise on lab results when ready  Elevated BP w/o dx of HTN - Elevated blood pressure, atypical for him, possibly related to edema and fluid retention. Not on antihypertensive medication. - Monitor blood pressure closely. - Advise low-sodium diet and increased water intake. - Reassess blood pressure after addressing fluid retention.  Hyperlipidemia LDL goal <70 -  Previously on atorvastatin  20 mg daily, stopped due to lack of refills, resumed after neurologist visit about 3w ago. Important for stroke and plaque prevention. Tolerating well. - Prescribe atorvastatin  20 mg daily. - Order labs to recheck cholesterol levels. - Advise daily atorvastatin  and schedule annual follow-up for medication management.      Subjective:    Outpatient Medications Prior to Visit  Medication Sig Dispense Refill  . aspirin  EC 81 MG tablet Take 1 tablet (81 mg total) by mouth daily. Swallow whole. 30 tablet 11  . atorvastatin  (LIPITOR ) 20 MG tablet TAKE 4 TABLETS BY MOUTH DAILY. 90 tablet 1  . Fe Bisgly-Vit C-Vit B12-FA (GENTLE IRON PO) Take 1 tablet by mouth in the morning and at bedtime.    . hydroxychloroquine (PLAQUENIL) 200 MG tablet Take 200 mg by mouth daily in the afternoon.    Austin [name] POTASSIUM  PO Take 1 tablet by mouth in the morning.    . atorvastatin  (LIPITOR ) 10 MG tablet Take 10 mg by mouth daily. (Patient not taking: Reported on 03/26/2024)    . Cyanocobalamin (VITAMIN B-12 PO) Take 1 tablet by mouth in the morning and at bedtime. (Patient not taking: Reported on 03/26/2024)     No facility-administered medications  prior to visit.   Past Medical History:  Diagnosis Date  . Acute CVA (cerebrovascular accident) (HCC) 12/03/2022  . Acute renal failure (HCC) 09/30/2013  . Anemia   . Arthritis   . HIP PAIN, LEFT, CHRONIC 02/18/2009   Qualifier: Diagnosis of   By: Darren Em MD, Bruce      . HYPERLIPIDEMIA 02/18/2009  . HYPERTENSION 02/18/2009  . Hypokalemia 12/03/2022  . Hyponatremia 10/02/2013  . Joint pain 09/28/2013  . Lupus glomerulonephritis (HCC)   . Nephritis   . Other pancytopenia (HCC) 09/29/2013  . Pancytopenia (HCC)   . Stroke [facility])    Past Surgical History:  Procedure Laterality Date  . BUBBLE STUDY  01/17/2023   Procedure: BUBBLE STUDY;  Surgeon: Bridgette Campus, MD;  Location: [facility] ENDOSCOPY;  Service: Cardiovascular;;  . COLONOSCOPY    . RENAL BIOPSY  12/06/2001  . TEE WITHOUT CARDIOVERSION N/A 01/17/2023   Procedure: TRANSESOPHAGEAL ECHOCARDIOGRAM (TEE);  Surgeon: Bridgette Campus, MD;  Location: [facility] ENDOSCOPY;  Service: Cardiovascular;  Laterality: N/A;  . TONSILLECTOMY    . WISDOM TOOTH EXTRACTION     No Known Allergies    Objective:    Physical Exam Vitals and nursing note reviewed.  Constitutional:      General: He is not in acute distress.    Appearance: Normal appearance.  HENT:     Head: Normocephalic.  Cardiovascular:     Rate and Rhythm: Normal rate and regular rhythm.  Pulmonary:     Effort: Pulmonary effort is normal.     Breath sounds: Normal breath sounds.  Musculoskeletal:        General: Normal range of motion.     Cervical back: Normal range of motion.     Right lower leg: 2+ Edema (mainly right knee and thigh) present.     Left lower leg: 1+ Edema present.  Skin:    General: Skin is warm and dry.  Neurological:     Mental Status: He is alert and oriented to person, place, and time.  Psychiatric:        Mood and Affect: Mood normal.   BP (!) 151/84 (BP Location: Left Arm, Patient Position: Sitting, Cuff Size: Large)   Pulse 80   Temp 98.4 F  (36.9 C) (Temporal)   Ht 6\' 5"  (1.956 m)   Wt 266 lb (120.7 kg)   SpO2 95%   BMI 31.54 kg/m  Wt Readings from Last 3 Encounters:  03/26/24 266 lb (120.7 kg)  03/06/24 247 lb 8 oz (112.3 kg)  07/14/23 243 lb (110.2 kg)      Versa Gore, NP

## 2024-03-26 NOTE — Assessment & Plan Note (Signed)
 Lupus nephritis may contribute to edema and fluid retention. No nephrologist visit in three years. On Plaquenil thru Rheumatology, reduced due to headaches, and taking OTC potassium supplements. - Check CMP, urine, CBC today - Refer to nephrologist for lupus nephritis evaluation and management. - Continue Plaquenil at current dose. - Will advise on lab results when ready

## 2024-03-26 NOTE — Assessment & Plan Note (Signed)
 Previously on atorvastatin  20 mg daily, stopped due to lack of refills, resumed after neurologist visit about 3w ago. Important for stroke and plaque prevention. Tolerating well. - Prescribe atorvastatin  20 mg daily. - Order labs to recheck cholesterol levels. - Advise daily atorvastatin  and schedule annual follow-up for medication management.

## 2024-03-26 NOTE — Progress Notes (Signed)
 VASCULAR LAB    Bilateral lower extremity venous duplex has been performed.  See CV proc for preliminary results.  Relayed negative results to Versa Gore, PA-C via secure chat  Carleene Chase, RVT 03/26/2024, 3:53 PM

## 2024-03-27 ENCOUNTER — Encounter: Payer: Self-pay | Admitting: Family

## 2024-03-27 MED ORDER — ATORVASTATIN CALCIUM 20 MG PO TABS
20.0000 mg | ORAL_TABLET | Freq: Every day | ORAL | 3 refills | Status: DC
Start: 2024-03-27 — End: 2024-08-31

## 2024-03-27 NOTE — Telephone Encounter (Signed)
 ok to extend his work note. I will address the labs in separate message, thanks.

## 2024-03-28 ENCOUNTER — Encounter: Payer: Self-pay | Admitting: Family

## 2024-03-29 ENCOUNTER — Encounter: Payer: Self-pay | Admitting: Family

## 2024-03-29 NOTE — Assessment & Plan Note (Signed)
 Elevated blood pressure, atypical for him, possibly related to edema and fluid retention. Not on antihypertensive medication. - Monitor blood pressure closely. - Advise low-sodium diet and increased water intake. - Reassess blood pressure after addressing fluid retention.

## 2024-03-29 NOTE — Assessment & Plan Note (Signed)
 Lupus nephritis may contribute to edema and fluid retention. No nephrologist visit in three years. On Plaquenil thru Rheumatology, reduced due to headaches, and taking OTC potassium supplements. - Check CMP, urine, CBC today - Refer to nephrologist for lupus nephritis evaluation and management. - Continue Plaquenil at current dose. - Will advise on lab results when ready

## 2024-03-30 ENCOUNTER — Other Ambulatory Visit: Payer: Self-pay | Admitting: Family

## 2024-03-30 DIAGNOSIS — M328 Other forms of systemic lupus erythematosus: Secondary | ICD-10-CM

## 2024-03-30 MED ORDER — FUROSEMIDE 40 MG PO TABS: 40.0000 mg | ORAL_TABLET | Freq: Two times a day (BID) | ORAL | 1 refills | Status: AC

## 2024-03-30 NOTE — Telephone Encounter (Signed)
 Copied from CRM [id number]. Topic: Referral - Question >> Mar 30, 2024  4:18 PM Dyann Glaser G wrote: Reason for CRM: THE PATIENT IS CALLING ABOUT THE REFERRAL THAT WAS SENT TO [facility], THE REFERRAL IS SHOWING IN OUR SYSTEM BUT IT'S SHOWING AS OUTGOING AND THE PATIENT CALLED THEM TODAY AND THEY DON'T HAVE HIS REFERRAL.

## 2024-04-01 ENCOUNTER — Encounter (HOSPITAL_COMMUNITY): Payer: Self-pay

## 2024-04-01 ENCOUNTER — Ambulatory Visit (HOSPITAL_COMMUNITY)
Admission: EM | Admit: 2024-04-01 | Discharge: 2024-04-01 | Disposition: A | Discharge status: Home / Self Care | Attending: Family Medicine | Admitting: Family Medicine

## 2024-04-01 DIAGNOSIS — R252 Cramp and spasm: Secondary | ICD-10-CM | POA: Insufficient documentation

## 2024-04-01 LAB — BASIC METABOLIC PANEL WITH GFR
Anion gap: 9 (ref 5–15)
BUN: 17 mg/dL (ref 6–20)
CO2: 24 mmol/L (ref 22–32)
Calcium: 9 mg/dL (ref 8.9–10.3)
Chloride: 100 mmol/L (ref 98–111)
Creatinine, Ser: 1.12 mg/dL (ref 0.61–1.24)
GFR, Estimated: >60 mL/min (ref >60)
Glucose, Bld: 77 mg/dL (ref 70–99)
Potassium: 4.3 mmol/L (ref 3.5–5.1)
Sodium: 133 mmol/L — ABNORMAL LOW (ref 135–145)

## 2024-04-01 NOTE — ED Triage Notes (Signed)
 Pt states that he has some bilateral leg cramping. X1 week

## 2024-04-01 NOTE — Discharge Instructions (Addendum)
  1. Leg cramping (Primary) - Basic metabolic panel ordered in UC, collected and sent to lab will await final results if abnormal patient will be contacted appropriate guidance provided. - If sodium levels are below 130 mg/dL treatment in the emergency department for hyponatremia may be recommended.  However if sodium levels are within 5 mg/dL positive or negative, home therapy may be recommended for treatment of bilateral leg cramps. -Continue to monitor symptoms for any change in severity if there is any escalation of current symptoms or development of new symptoms follow-up in ER for further evaluation and management.

## 2024-04-01 NOTE — ED Provider Notes (Signed)
 [facility]  Note:  This document was prepared using Dragon voice recognition software and may include unintentional dictation errors.  MRN: [id number] DOB: [birth year]  Subjective:   Austin [name] is a 48 y.o. male presenting for bilateral leg cramping x 1 to 2 days.  Patient reports that he has had past history of abnormal electrolytes due to kidney dysfunction.  Patient denies any lower extremity swelling in relation to his current cramping but states previously after going on a cruise patient had significant leg swelling and cramping and it was due to severely elevated sodium levels.  Patient concern for abnormalities to his electrolytes causing his cramping.  Patient reports that he took a cyclobenzaprine  last night when cramping started that seemed to help him sleep and cramping right now is currently under control but was still concerned that his electrolytes may be out of balance.  No chest pain, shortness of breath, weakness, dizziness.  No current facility-administered medications for this encounter.  Current Outpatient Medications:    aspirin  EC 81 MG tablet, Take 1 tablet (81 mg total) by mouth daily. Swallow whole., Disp: 30 tablet, Rfl: 11   atorvastatin  (LIPITOR ) 20 MG tablet, Take 1 tablet (20 mg total) by mouth daily., Disp: 90 tablet, Rfl: 3   Fe Bisgly-Vit C-Vit B12-FA (GENTLE IRON PO), Take 1 tablet by mouth in the morning and at bedtime., Disp: , Rfl:    furosemide (LASIX) 40 MG tablet, Take 1 tablet (40 mg total) by mouth 2 (two) times daily. Take twice a day for 3 days, breakfast and after lunch, then take daily at breakfast., Disp: 60 tablet, Rfl: 1   hydroxychloroquine (PLAQUENIL) 200 MG tablet, Take 200 mg by mouth daily in the afternoon., Disp: , Rfl:    POTASSIUM PO, Take 1 tablet by mouth in the morning., Disp: , Rfl:    No Known Allergies  Past Medical History:  Diagnosis Date   Acute CVA (cerebrovascular accident) (HCC) 12/03/2022   Acute  renal failure (HCC) 09/30/2013   Anemia    Arthritis    HIP PAIN, LEFT, CHRONIC 02/18/2009   Qualifier: Diagnosis of   By: Darren Em MD, Bruce       HYPERLIPIDEMIA 02/18/2009   HYPERTENSION 02/18/2009   Hypokalemia 12/03/2022   Hyponatremia 10/02/2013   Joint pain 09/28/2013   Lupus glomerulonephritis (HCC)    Nephritis    Other pancytopenia (HCC) 09/29/2013   Pancytopenia (HCC)    Stroke [facility])      Past Surgical History:  Procedure Laterality Date   BUBBLE STUDY  01/17/2023   Procedure: BUBBLE STUDY;  Surgeon: Bridgette Campus, MD;  Location: [facility] ENDOSCOPY;  Service: Cardiovascular;;   COLONOSCOPY     RENAL BIOPSY  12/06/2001   TEE WITHOUT CARDIOVERSION N/A 01/17/2023   Procedure: TRANSESOPHAGEAL ECHOCARDIOGRAM (TEE);  Surgeon: Bridgette Campus, MD;  Location: [facility] ENDOSCOPY;  Service: Cardiovascular;  Laterality: N/A;   TONSILLECTOMY     WISDOM TOOTH EXTRACTION      Family History  Problem Relation Age of Onset   Hyperlipidemia Father    Hypertension Maternal Grandmother    Diabetes Maternal Grandmother    Hypertension Maternal Grandfather    Cancer Maternal Grandfather        prostate   Colon cancer Neg Hx    Stomach cancer Neg Hx    Esophageal cancer Neg Hx    Rectal cancer Neg Hx     Social History   Tobacco Use   Smoking status: Never  Smokeless tobacco: Never  Vaping Use   Vaping status: Never Used  Substance Use Topics   Alcohol use: Not Currently   Drug use: No    ROS Refer to HPI for ROS details.  Objective:   Vitals: BP 117/71 (BP Location: Left Arm)   Pulse (!) 105   Temp 98.3 F (36.8 C) (Oral)   Resp 17   Ht 6\' 5"  (1.956 m)   Wt 238 lb (108 kg)   SpO2 95%   BMI 28.22 kg/m   Physical Exam Vitals and nursing note reviewed.  Constitutional:      General: He is not in acute distress.    Appearance: He is well-developed. He is not ill-appearing or toxic-appearing.  HENT:     Head: Normocephalic.     Nose: Nose normal.      Mouth/Throat:     Mouth: Mucous membranes are moist.     Pharynx: Oropharynx is clear.  Eyes:     General:        Right eye: No discharge.        Left eye: No discharge.     Extraocular Movements: Extraocular movements intact.     Conjunctiva/sclera: Conjunctivae normal.  Cardiovascular:     Rate and Rhythm: Normal rate.  Pulmonary:     Effort: Pulmonary effort is normal. No respiratory distress.  Musculoskeletal:     Right upper leg: Tenderness present. No swelling, edema or bony tenderness.     Left upper leg: Tenderness present. No swelling, edema or bony tenderness.     Right lower leg: Tenderness present. No swelling or bony tenderness. No edema.     Left lower leg: Tenderness present. No swelling or bony tenderness. No edema.  Skin:    General: Skin is warm and dry.  Neurological:     General: No focal deficit present.     Mental Status: He is alert and oriented to person, place, and time.  Psychiatric:        Mood and Affect: Mood normal.        Behavior: Behavior normal.     Procedures  No results found for this or any previous visit (from the past 24 hours).  No results found.   Assessment and Plan :     Discharge Instructions       1. Leg cramping (Primary) - Basic metabolic panel ordered in UC, collected and sent to lab will await final results if abnormal patient will be contacted appropriate guidance provided. - If sodium levels are below 130 mg/dL treatment in the emergency department for hyponatremia may be recommended.  However if sodium levels are within 5 mg/dL positive or negative, home therapy may be recommended for treatment of bilateral leg cramps. -Continue to monitor symptoms for any change in severity if there is any escalation of current symptoms or development of new symptoms follow-up in ER for further evaluation and management.      Rakeb Kibble B Nikolette Reindl   Austin [name], [place], [place] 04/01/24 1106

## 2024-04-02 NOTE — Telephone Encounter (Signed)
 Noted; duplicate MyChart message, sent pt msg to PCP to review

## 2024-04-02 NOTE — Telephone Encounter (Signed)
 Please see pt msg and advise on extended leave note for employer and recommendations on sodium levels.

## 2024-04-03 NOTE — Telephone Encounter (Signed)
 ok to give extended leave note - please call [place] [facility] and let them know I personally spoke to Dr. Cindra Cree on Friday and he was supposed to be getting Myrtie Atkinson on someone's schedule this week? let me know!

## 2024-04-04 ENCOUNTER — Encounter: Payer: Self-pay | Admitting: Family

## 2024-04-04 DIAGNOSIS — M3214 Glomerular disease in systemic lupus erythematosus: Secondary | ICD-10-CM | POA: Diagnosis not present

## 2024-04-04 DIAGNOSIS — N182 Chronic kidney disease, stage 2 (mild): Secondary | ICD-10-CM | POA: Diagnosis not present

## 2024-04-04 LAB — CBC AND DIFFERENTIAL
Hemoglobin: 13 — AB (ref 13.5–17.5)
Platelets: 150 10*3/uL (ref 150–400)
WBC: 4.6

## 2024-04-04 LAB — BASIC METABOLIC PANEL WITH GFR
Potassium: 4.2 meq/L (ref 3.5–5.1)
Sodium: 135 — AB (ref 137–147)

## 2024-04-06 ENCOUNTER — Telehealth: Payer: Self-pay | Admitting: Family

## 2024-04-06 NOTE — Telephone Encounter (Signed)
 Patient dropped off document FMLA, to be filled out by provider. Patient requested to send it back via Call Patient to pick up within 7-days. Document is located in providers tray at front office.Please advise at Mobile [phone] (mobile)

## 2024-04-06 NOTE — Telephone Encounter (Signed)
 Form received

## 2024-04-09 DIAGNOSIS — Z0279 Encounter for issue of other medical certificate: Secondary | ICD-10-CM

## 2024-06-05 DIAGNOSIS — N182 Chronic kidney disease, stage 2 (mild): Secondary | ICD-10-CM | POA: Diagnosis not present

## 2024-06-05 DIAGNOSIS — M3214 Glomerular disease in systemic lupus erythematosus: Secondary | ICD-10-CM | POA: Diagnosis not present

## 2024-06-05 DIAGNOSIS — N041 Nephrotic syndrome with focal and segmental glomerular lesions: Secondary | ICD-10-CM | POA: Diagnosis not present

## 2024-07-11 DIAGNOSIS — N182 Chronic kidney disease, stage 2 (mild): Secondary | ICD-10-CM | POA: Diagnosis not present

## 2024-08-16 DIAGNOSIS — N182 Chronic kidney disease, stage 2 (mild): Secondary | ICD-10-CM | POA: Diagnosis not present

## 2024-08-16 DIAGNOSIS — Z6830 Body mass index (BMI) 30.0-30.9, adult: Secondary | ICD-10-CM | POA: Diagnosis not present

## 2024-08-21 DIAGNOSIS — M3214 Glomerular disease in systemic lupus erythematosus: Secondary | ICD-10-CM | POA: Diagnosis not present

## 2024-08-21 DIAGNOSIS — N189 Chronic kidney disease, unspecified: Secondary | ICD-10-CM | POA: Diagnosis not present

## 2024-08-21 DIAGNOSIS — N041 Nephrotic syndrome with focal and segmental glomerular lesions: Secondary | ICD-10-CM | POA: Diagnosis not present

## 2024-08-21 DIAGNOSIS — D631 Anemia in chronic kidney disease: Secondary | ICD-10-CM | POA: Diagnosis not present

## 2024-08-30 ENCOUNTER — Encounter: Payer: Self-pay | Admitting: Family

## 2024-08-31 ENCOUNTER — Other Ambulatory Visit: Payer: Self-pay

## 2024-08-31 DIAGNOSIS — E785 Hyperlipidemia, unspecified: Secondary | ICD-10-CM

## 2024-08-31 MED ORDER — ATORVASTATIN CALCIUM 20 MG PO TABS: 20.0000 mg | ORAL_TABLET | Freq: Every day | ORAL | 0 refills | Status: AC

## 2025-03-06 ENCOUNTER — Ambulatory Visit: Admitting: Family Medicine

## 2025-03-12 ENCOUNTER — Ambulatory Visit: Admitting: Family Medicine
